# Patient Record
Sex: Female | Born: 1970
Health system: Southern US, Community
[De-identification: ages and names within clinical notes are randomized; demographics above are authoritative.]

## PROBLEM LIST (undated history)

## (undated) DIAGNOSIS — I10 Essential (primary) hypertension: Secondary | ICD-10-CM

## (undated) DIAGNOSIS — T7840XA Allergy, unspecified, initial encounter: Secondary | ICD-10-CM

## (undated) DIAGNOSIS — E079 Disorder of thyroid, unspecified: Secondary | ICD-10-CM

## (undated) DIAGNOSIS — I491 Atrial premature depolarization: Secondary | ICD-10-CM

## (undated) DIAGNOSIS — G9081 Serotonin syndrome: Secondary | ICD-10-CM

## (undated) DIAGNOSIS — M4722 Other spondylosis with radiculopathy, cervical region: Secondary | ICD-10-CM

## (undated) DIAGNOSIS — E785 Hyperlipidemia, unspecified: Secondary | ICD-10-CM

## (undated) DIAGNOSIS — S060X9A Concussion with loss of consciousness of unspecified duration, initial encounter: Secondary | ICD-10-CM

## (undated) DIAGNOSIS — M4712 Other spondylosis with myelopathy, cervical region: Secondary | ICD-10-CM

## (undated) DIAGNOSIS — G2579 Other drug induced movement disorders: Secondary | ICD-10-CM

## (undated) HISTORY — PX: JOINT REPLACEMENT: SHX530

## (undated) HISTORY — DX: Other spondylosis with myelopathy, cervical region: M47.12

## (undated) HISTORY — DX: Hyperlipidemia, unspecified: E78.5

## (undated) HISTORY — PX: SPINE SURGERY: SHX786

## (undated) HISTORY — PX: BREAST SURGERY: SHX581

## (undated) HISTORY — DX: Disorder of thyroid, unspecified: E07.9

## (undated) HISTORY — PX: BREAST LUMPECTOMY: SHX2

## (undated) HISTORY — DX: Allergy, unspecified, initial encounter: T78.40XA

## (undated) HISTORY — DX: Atrial premature depolarization: I49.1

## (undated) HISTORY — DX: Serotonin syndrome: G90.81

## (undated) HISTORY — DX: Other spondylosis with radiculopathy, cervical region: M47.22

## (undated) HISTORY — DX: Concussion with loss of consciousness of unspecified duration, initial encounter: S06.0X9A

## (undated) HISTORY — DX: Other drug induced movement disorders: G25.79

---
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
## 2004-11-18 ENCOUNTER — Ambulatory Visit: Payer: Self-pay | Admitting: Internal Medicine

## 2007-09-28 ENCOUNTER — Ambulatory Visit: Payer: Self-pay

## 2007-10-09 ENCOUNTER — Ambulatory Visit: Payer: Self-pay

## 2007-10-09 ENCOUNTER — Ambulatory Visit: Payer: Self-pay | Admitting: Otolaryngology

## 2008-02-05 ENCOUNTER — Ambulatory Visit: Payer: Self-pay | Admitting: Otolaryngology

## 2008-03-19 ENCOUNTER — Ambulatory Visit: Payer: Self-pay | Admitting: Internal Medicine

## 2008-11-05 ENCOUNTER — Ambulatory Visit: Payer: Self-pay

## 2009-09-14 LAB — HM PAP SMEAR: HM Pap smear: NEGATIVE

## 2010-01-15 ENCOUNTER — Ambulatory Visit: Payer: Self-pay | Admitting: Internal Medicine

## 2010-05-11 HISTORY — PX: TONSILLECTOMY: SUR1361

## 2010-06-03 ENCOUNTER — Ambulatory Visit: Payer: Self-pay | Admitting: Otolaryngology

## 2010-06-04 LAB — PATHOLOGY REPORT

## 2010-10-26 ENCOUNTER — Telehealth: Payer: Self-pay | Admitting: *Deleted

## 2010-10-26 DIAGNOSIS — G4700 Insomnia, unspecified: Secondary | ICD-10-CM

## 2010-10-26 DIAGNOSIS — T753XXA Motion sickness, initial encounter: Secondary | ICD-10-CM

## 2010-10-26 MED ORDER — ZOLPIDEM TARTRATE 5 MG PO TABS: 5.0000 mg | ORAL_TABLET | Freq: Every evening | ORAL | Status: DC | PRN

## 2010-10-26 MED ORDER — RIFAXIMIN 200 MG PO TABS: 200.0000 mg | ORAL_TABLET | Freq: Three times a day (TID) | ORAL | Status: AC

## 2010-10-26 NOTE — Telephone Encounter (Signed)
Patient requesting RX's  1. RF ambien 5 mg 1 hs 2. RF Xifaxan 200 mg 1 qd - She will be leaving to go on mission Oct 30th for 12 days, says she starts this med 2 days prior.

## 2010-10-26 NOTE — Telephone Encounter (Signed)
rxs given,  You didn' t specify how many days of the rifamixin so I gave her #10 days  #30

## 2010-10-26 NOTE — Telephone Encounter (Signed)
Left pt VM to check with her pharm, faxed in RX.

## 2010-11-25 ENCOUNTER — Other Ambulatory Visit: Payer: Self-pay | Admitting: Internal Medicine

## 2010-11-25 DIAGNOSIS — G4700 Insomnia, unspecified: Secondary | ICD-10-CM

## 2010-11-25 MED ORDER — ZOLPIDEM TARTRATE 5 MG PO TABS: 5.0000 mg | ORAL_TABLET | Freq: Every evening | ORAL | Status: DC | PRN

## 2010-12-11 HISTORY — PX: ABDOMINAL HYSTERECTOMY: SHX81

## 2010-12-29 ENCOUNTER — Telehealth: Payer: Self-pay | Admitting: *Deleted

## 2010-12-29 DIAGNOSIS — Z1239 Encounter for other screening for malignant neoplasm of breast: Secondary | ICD-10-CM

## 2010-12-29 NOTE — Telephone Encounter (Signed)
Patient called to request an order for a screening mammogram.  She would like to have this done at [facility].  Please advise.

## 2010-12-29 NOTE — Telephone Encounter (Signed)
Done in EPIC 

## 2010-12-30 NOTE — Telephone Encounter (Signed)
Left message notifying patient that order has been sent to [facility].

## 2011-02-15 LAB — HM MAMMOGRAPHY: HM Mammogram: NORMAL

## 2011-02-18 ENCOUNTER — Ambulatory Visit: Payer: Self-pay | Admitting: Internal Medicine

## 2011-03-14 ENCOUNTER — Telehealth: Payer: Self-pay | Admitting: Internal Medicine

## 2011-03-14 MED ORDER — HYDROCODONE-ACETAMINOPHEN 5-500 MG PO TABS: 1.0000 | ORAL_TABLET | Freq: Four times a day (QID) | ORAL | Status: AC | PRN

## 2011-03-14 NOTE — Telephone Encounter (Signed)
i am happy to see Patricia [name] before her physical for new onset hypertension. The ibuprofen may be causing the elevated readings if she has been taking it daily.  Ask her to stop the ibuprofen and try tramadol 50 mg every 8 hrs prn pain  #60  and see if the bp comes down in a few days.  If not,  Call back and we wills tart an mild antihypertensive until i can see her.

## 2011-03-14 NOTE — Telephone Encounter (Signed)
Office Message [address] [place], [place] [address] p. [phone] f. [phone] To: [facility] (Daytime Triage) Fax: [phone] From: [facility] Date/ Time: 03/14/2011 9:33 AM Taken By: Sundra Aland, RN Caller: Alexiya Facility: Not Collected Patient: Patricia [name], Patricia [name] DOB: [birth year] Phone: [phone] Reason for Call: Caller was unable to be reached on callback - Left Message Regarding Appointment: No Appt Date: Appt Time: Unknown Provider: Reason: Details: Outcome:

## 2011-03-14 NOTE — Telephone Encounter (Signed)
Office Message [address] [place], [place] [address] p. [phone] f. [phone] To: [facility] (Daytime Triage) Fax: [phone] From: [facility] Date/ Time: 03/14/2011 12:41 PM Taken By: Lesli Albee, RN Caller: Riyanshi Facility: not collected Patient: Patricia, [name] DOB: 1970/08/05 Phone: [phone] Reason for Call: Pt insistent that MD be asked if she needs an appt before her appt April 5/13. Pt has been having elevated BP readings of 104-161/88-106 with headaches lasting most of the day. Triage Rn advised appt but pt states sometimes MD will have her fax things over for her to review. Is asking if she can fax BP readings and MD could advise from there. Regarding Appointment: Appt Date: Appt Time: Unknown Provider: Reason: Details: Outcome: confidential

## 2011-03-14 NOTE — Telephone Encounter (Signed)
Patient notified, while talking to her about the tramadol she says that the tramadol has made her sick in the past. I spoke with Dr. Darrick Huntsman and she said to call her in vicodin 5-500 every 6 hr prn. Patient aware that rx is being called in and will call back in a few days if BP has not started to come down. Rx has been called in.

## 2011-03-14 NOTE — Telephone Encounter (Signed)
Tried calling patient, left message asking patient to call back.

## 2011-03-14 NOTE — Telephone Encounter (Signed)
[facility] Triage Call Report Triage Record Num: [id number] Operator: Lesli Albee Patient Name: Patricia [name] Call Date & Time: 03/14/2011 12:26:23PM Patient Phone: [phone] PCP: Patient Gender: Female PCP Fax : Patient DOB: Mar 08, 1970 Practice Name: [facility] Reason for Call: Caller: Earnie/Patient; PCP: Duncan Dull; CB#: [phone]; ; ; Call regarding Blood Pressure Is Up, With Headache 151/98 At 7:30am; Pt has had elevated blood pressure reading x 3 months. This am the h/a was 151/98 - she has not rechecked it. Her next physical s 04/15/2011. She wants to know if MD needs to see her before that appt. Pt has reading of 140-161/ 88-106. Pt states she has "kept " a h/a x the past 3 months. she has them for several hours in the am and then it turns into a nagging h/a - it gets more intense into the evening. Pt has been taking Motrin. Her normal BP is 120/70. Pt has been under alot of stress over the past 6 months. Pt insistent that a request be sent to MD to see if she can send her her BP readings from the clinic . RN sent the message. Protocol(s) Used: Hypertension, Diagnosed or Suspected Recommended Outcome per Protocol: See Provider within 72 Hours Reason for Outcome: Multiple elevated blood pressure readings without other symptoms AND no previous work-up OR readings exceed expected range defined by treatment plan Care Advice: ~ 03/14/2011 1:45:42PM Page 1 of 1 [email or web address] [facility] Triage Call Report Triage Record Num: [id number] Operator: Lesli Albee Patient Name: Patricia [name] Call Date & Time: 03/14/2011 12:26:23PM Patient Phone: [phone] PCP: Patient Gender: Female PCP Fax : Patient DOB: 1970/10/14 Practice Name: [facility] Reason for Call: Caller: Eileene/Patient; PCP: Duncan Dull; CB#: [phone]; ; ; Call regarding Blood Pressure Is Up, With Headache 151/98 At 7:30am; Pt has had elevated blood pressure  reading x 3 months. This am the h/a was 151/98 - she has not rechecked it. Her next physical s 04/15/2011. She wants to know if MD needs to see her before that appt. Pt has reading of 140-161/ 88-106. Pt states she has "kept " a h/a x the past 3 months. she has them for several hours in the am and then it turns into a nagging h/a - it gets more intense into the evening. Pt has been taking Motrin. Her normal BP is 120/70. Pt has been under alot of stress over the past 6 months. Pt insistent that a request be sent to MD to see if she can send her her BP readings from the clinic . RN sent the message. Protocol(s) Used: Headache Recommended Outcome per Protocol: See Provider within 24 hours Reason for Outcome: Constant headache AND unrelieved with nonprescription medications Care Advice: ~ Do not drink alcoholic beverages or use tobacco products. ~ SYMPTOM / CONDITION MANAGEMENT Most adults need to drink 6-10 eight-ounce glasses (1.2-2.0 liters) of fluids per day unless previously told to limit fluid intake for other medical reasons. Limit fluids that contain caffeine, sugar or alcohol. Urine will be a very light yellow color when you drink enough fluids. ~ Analgesic/Antipyretic Advice - Acetaminophen: Consider acetaminophen as directed on label or by pharmacist/provider for pain or fever PRECAUTIONS: - Use if there is no history of liver disease, alcoholism, or intake of three or more alcohol drinks per day - Only if approved by provider during pregnancy or when breastfeeding - During pregnancy, acetaminophen should not be taken more than 3 consecutive days without telling provider - Do not exceed  recommended dose or frequency ~ 03/14/2011 1:45:44PM Page 1 of 1 [email or web address]_[email or web address]_[email or web address]

## 2011-03-15 ENCOUNTER — Encounter: Payer: Self-pay | Admitting: Internal Medicine

## 2011-03-17 ENCOUNTER — Telehealth: Payer: Self-pay | Admitting: Internal Medicine

## 2011-03-17 MED ORDER — AMLODIPINE BESYLATE 5 MG PO TABS: 5.0000 mg | ORAL_TABLET | Freq: Every day | ORAL | Status: DC

## 2011-03-17 NOTE — Telephone Encounter (Signed)
Patient called and wanted to let you know she is still having elevated BPs, but no more headaches.  She stated this morning when she woke up it was 149/104.  She has stopped taking ibuprofen as you asked.  She wanted to know if you wanted to call her something in or if she needs to make an earlier appt.  Her next appt is April 5th.  Please advise.

## 2011-03-17 NOTE — Telephone Encounter (Signed)
Patient notified of Rx.  She will call back if BP is still elevated.

## 2011-03-17 NOTE — Telephone Encounter (Signed)
rx for amlodipine 5 mg daily sent to pharmacy.  It still may be the ibuprofen  Doing it but time will tell

## 2011-04-05 ENCOUNTER — Other Ambulatory Visit: Payer: Self-pay | Admitting: Internal Medicine

## 2011-04-05 MED ORDER — MONTELUKAST SODIUM 10 MG PO TABS: 10.0000 mg | ORAL_TABLET | Freq: Every day | ORAL | Status: DC

## 2011-04-15 ENCOUNTER — Ambulatory Visit (INDEPENDENT_AMBULATORY_CARE_PROVIDER_SITE_OTHER): Payer: Self-pay | Admitting: Internal Medicine

## 2011-04-15 ENCOUNTER — Encounter: Payer: Self-pay | Admitting: Internal Medicine

## 2011-04-15 VITALS — BP 146/72 | HR 100 | Temp 98.3°F | Resp 16 | Ht 68.0 in | Wt 201.2 lb

## 2011-04-15 DIAGNOSIS — Z0000 Encounter for general adult medical examination without abnormal findings: Secondary | ICD-10-CM

## 2011-04-15 DIAGNOSIS — I10 Essential (primary) hypertension: Secondary | ICD-10-CM

## 2011-04-15 DIAGNOSIS — G4700 Insomnia, unspecified: Secondary | ICD-10-CM

## 2011-04-15 DIAGNOSIS — E079 Disorder of thyroid, unspecified: Secondary | ICD-10-CM

## 2011-04-15 MED ORDER — LOSARTAN POTASSIUM-HCTZ 50-12.5 MG PO TABS: 1.0000 | ORAL_TABLET | Freq: Every day | ORAL | Status: DC

## 2011-04-15 MED ORDER — ZOLPIDEM TARTRATE 5 MG PO TABS: 5.0000 mg | ORAL_TABLET | Freq: Every evening | ORAL | Status: DC | PRN

## 2011-04-15 MED ORDER — PRAMIPEXOLE DIHYDROCHLORIDE 0.25 MG PO TABS: 0.2500 mg | ORAL_TABLET | Freq: Every day | ORAL | Status: DC

## 2011-04-15 MED ORDER — LOSARTAN POTASSIUM 50 MG PO TABS: 50.0000 mg | ORAL_TABLET | Freq: Every day | ORAL | Status: DC

## 2011-04-15 NOTE — Patient Instructions (Signed)
Consider the Low Glycemic Index Diet and 6 smaller meals daily  To manage blood sugars and weight:   7 AM Low carbohydrate Protein  Shakes (EAS Carb Control  Or Atkins ,  Available everywhere,   In  cases at [employer]Js )  2.5 carbs  (Add or substitute a toasted sandwhich thin w/ peanut butter)  10 AM: Protein bar by Atkins (snack size,  Chocolate lover's variety at  [employer])    Lunch: sandwich on pita bread or flatbread (Joseph's makes a pita bread and a flat bread , available at [employer] and [employer]; Toufayah makes a low carb flatbread available at [employer] and [employer]) Bueno vida and Mission make low carb whole wheat tortillas available at most grocer stores  3 PM:  Mid day :  Another protein bar,  Or a  cheese stick, 1/4 cup of almonds, walnuts, pistachios, pecans, peanuts,  Macadamia nuts  6 PM  Dinner:  "mean and green:"  Meat/chicken/fish, salad, and green veggie : use ranch, vinagrette,  Blue cheese, etc  9 PM snack : Breyer's low carb fudgiscle or  ice cream bar (Carb Smart) Weight Watcher's ice cream bar ,Skinny Cow or another protein shake ________________________________________________________________________________________________________   We are changing your amlodipine to losartan 50 mg daily.

## 2011-04-15 NOTE — Progress Notes (Signed)
Patient ID: Patricia [name], female   DOB: 1970/04/24, 41 y.o.   MRN: [id number]   Patient Active Problem List  Diagnoses  . Thyroid disease  . Hypertension  . Annual physical exam    Subjective:  CC:   Chief Complaint  Patient presents with  . Gynecologic Exam    HPI:   Patricia [name] a 40 y.o. female who presents  Past Medical History  Diagnosis Date  . Thyroid disease     thyroid nodules    Past Surgical History  Procedure Date  . Abdominal hysterectomy Dec 2012    secondary to fibroid, heavy bleeding   . Tonsillectomy May 2012    [facility]         The following portions of the patient's history were reviewed and updated as appropriate: Allergies, current medications, and problem list.    Review of Systems:   12 Pt  review of systems was negative except those addressed in the HPI,     History   Social History  . Marital Status: Married    Spouse Name: N/A    Number of Children: N/A  . Years of Education: N/A   Occupational History  . Not on file.   Social History Main Topics  . Smoking status: Never Smoker   . Smokeless tobacco: Never Used  . Alcohol Use: Yes  . Drug Use: No  . Sexually Active: Not on file   Other Topics Concern  . Not on file   Social History Narrative  . No narrative on file    Objective:  BP 146/72  Pulse 100  Temp(Src) 98.3 F (36.8 C) (Oral)  Resp 16  Ht 5\' 8"  (1.727 m)  Wt 201 lb 4 oz (91.286 kg)  BMI 30.60 kg/m2  SpO2 100%  General appearance: alert, cooperative and appears stated age Head: Normocephalic, without obvious abnormality, atraumatic Eyes: conjunctivae/corneas clear. PERRL, EOM's intact. Fundi benign. Ears: normal TM's and external ear canals both ears Nose: Nares normal. Septum midline. Mucosa normal. No drainage or sinus tenderness. Throat: lips, mucosa, and tongue normal; teeth and gums normal Neck: no adenopathy, no carotid bruit, no JVD, supple, symmetrical, trachea midline and  thyroid not enlarged, symmetric, no tenderness/mass/nodules Lungs: clear to auscultation bilaterally Breasts: normal appearance, no masses or tenderness Heart: regular rate and rhythm, S1, S2 normal, no murmur, click, rub or gallop Abdomen: soft, non-tender; bowel sounds normal; no masses,  no organomegaly Gyn: normal external genitalia, normal vaginal vault, cervix, ovaries nonpalpable Extremities: extremities normal, atraumatic, no cyanosis or edema Pulses: 2+ and symmetric Skin: Skin color, texture, turgor normal. No rashes or lesions Neurologic: Alert and oriented X 3, normal strength and tone. Normal symmetric reflexes. Normal coordination and gait.  Assessment and Plan:  Hypertension New onset, with recent initiation of therapy with amlodipine, which was been causing fluid retention.  Trial od losartan,,  Needs repeat BMET in one week..   Thyroid disease With recent negative biopsy by endocrinologist.   Annual physical exam Breast and pelvic were done.     Updated Medication List Outpatient Encounter Prescriptions as of 04/15/2011  Medication Sig Dispense Refill  . loratadine (CLARITIN) 10 MG tablet Take 10 mg by mouth daily.      . montelukast (SINGULAIR) 10 MG tablet Take 1 tablet (10 mg total) by mouth at bedtime.  30 tablet  3  . pramipexole (MIRAPEX) 0.25 MG tablet Take 1 tablet (0.25 mg total) by mouth daily.  90 tablet  3  . zolpidem (  AMBIEN) 5 MG tablet Take 1 tablet (5 mg total) by mouth at bedtime as needed for sleep.  30 tablet  5  . DISCONTD: amLODipine (NORVASC) 5 MG tablet Take 1 tablet (5 mg total) by mouth daily.  30 tablet  3  . DISCONTD: pramipexole (MIRAPEX) 0.25 MG tablet Take 0.25 mg by mouth daily.      Marland Kitchen DISCONTD: zolpidem (AMBIEN) 5 MG tablet Take 1 tablet (5 mg total) by mouth at bedtime as needed for sleep.  30 tablet  5  . losartan (COZAAR) 50 MG tablet Take 1 tablet (50 mg total) by mouth daily.  90 tablet  3  . DISCONTD: losartan-hydrochlorothiazide  (HYZAAR) 50-12.5 MG per tablet Take 1 tablet by mouth daily.  30 tablet  3     Orders Placed This Encounter  Procedures  . [facility] MAMMOGRAPHY    No Follow-up on file.

## 2011-04-17 ENCOUNTER — Encounter: Payer: Self-pay | Admitting: Internal Medicine

## 2011-04-17 DIAGNOSIS — I10 Essential (primary) hypertension: Secondary | ICD-10-CM | POA: Insufficient documentation

## 2011-04-17 DIAGNOSIS — Z0000 Encounter for general adult medical examination without abnormal findings: Secondary | ICD-10-CM | POA: Insufficient documentation

## 2011-04-17 DIAGNOSIS — E079 Disorder of thyroid, unspecified: Secondary | ICD-10-CM | POA: Insufficient documentation

## 2011-04-17 NOTE — Assessment & Plan Note (Signed)
New onset, with recent initiation of therapy with amlodipine, which was been causing fluid retention.  Trial od losartan,,  Needs repeat BMET in one week.Marland Kitchen

## 2011-04-17 NOTE — Assessment & Plan Note (Signed)
Breast and pelvic were done.

## 2011-04-17 NOTE — Assessment & Plan Note (Signed)
With recent negative biopsy by endocrinologist.

## 2011-04-26 ENCOUNTER — Encounter: Payer: Self-pay | Admitting: Internal Medicine

## 2011-04-28 ENCOUNTER — Other Ambulatory Visit (INDEPENDENT_AMBULATORY_CARE_PROVIDER_SITE_OTHER): Payer: Self-pay | Admitting: *Deleted

## 2011-04-28 DIAGNOSIS — Z79899 Other long term (current) drug therapy: Secondary | ICD-10-CM

## 2011-04-28 LAB — BASIC METABOLIC PANEL
BUN: 9 mg/dL (ref 6–23)
CO2: 23 mEq/L (ref 19–32)
Calcium: 8.9 mg/dL (ref 8.4–10.5)
Chloride: 103 mEq/L (ref 96–112)
Creatinine, Ser: 0.8 mg/dL (ref 0.4–1.2)
GFR: 79.22 mL/min (ref >60.00)
Glucose, Bld: 98 mg/dL (ref 70–99)
Potassium: 4 mEq/L (ref 3.5–5.1)
Sodium: 136 mEq/L (ref 135–145)

## 2011-06-15 ENCOUNTER — Telehealth: Payer: Self-pay | Admitting: Internal Medicine

## 2011-06-15 DIAGNOSIS — G4700 Insomnia, unspecified: Secondary | ICD-10-CM

## 2011-06-15 MED ORDER — MONTELUKAST SODIUM 10 MG PO TABS: 10.0000 mg | ORAL_TABLET | Freq: Every day | ORAL | Status: DC

## 2011-06-15 MED ORDER — ZOLPIDEM TARTRATE 10 MG PO TABS: 10.0000 mg | ORAL_TABLET | Freq: Every evening | ORAL | Status: DC | PRN

## 2011-06-15 NOTE — Telephone Encounter (Signed)
Patient called and stated she needs a refill on her zolpidem but she said the 5 mg tablet is not working for her.  She stated she wakes up 2 hours after falling asleep and then can't go back to sleep.  She wanted to know if she could go back on the 10 mg tablet.  Please advise.

## 2011-06-15 NOTE — Telephone Encounter (Signed)
She can either have the 10 mg or the Ambien Ct.  Since the 10 mg is generic we can try that first.  10 mg rx approved in EPIC, can you call to [facility]?

## 2011-06-15 NOTE — Telephone Encounter (Signed)
Patient notified, Rx called in. 

## 2011-08-09 ENCOUNTER — Encounter: Payer: Self-pay | Admitting: Internal Medicine

## 2011-10-12 ENCOUNTER — Telehealth: Payer: Self-pay | Admitting: Internal Medicine

## 2011-10-12 MED ORDER — MONTELUKAST SODIUM 10 MG PO TABS: 10.0000 mg | ORAL_TABLET | Freq: Every day | ORAL | Status: DC

## 2011-10-12 MED ORDER — PRAMIPEXOLE DIHYDROCHLORIDE 0.25 MG PO TABS: 0.2500 mg | ORAL_TABLET | Freq: Every day | ORAL | Status: DC

## 2011-10-12 NOTE — Telephone Encounter (Signed)
Pt is needing her rx switched over to [facility] and she is needing refills on Singular and Mirapex.

## 2011-10-12 NOTE — Telephone Encounter (Signed)
rx sent to pharmacy by e-script, [facility] pharmacy

## 2012-01-23 ENCOUNTER — Telehealth: Payer: Self-pay | Admitting: Internal Medicine

## 2012-01-23 DIAGNOSIS — Z1239 Encounter for other screening for malignant neoplasm of breast: Secondary | ICD-10-CM

## 2012-01-23 NOTE — Telephone Encounter (Signed)
On printer

## 2012-01-23 NOTE — Telephone Encounter (Signed)
Med called in

## 2012-01-23 NOTE — Telephone Encounter (Signed)
Pt is needing a referral for her Mammo. At [facility]

## 2012-02-21 ENCOUNTER — Ambulatory Visit: Payer: Self-pay | Admitting: Internal Medicine

## 2012-02-27 ENCOUNTER — Ambulatory Visit: Payer: Self-pay | Admitting: Internal Medicine

## 2012-02-27 ENCOUNTER — Telehealth: Payer: Self-pay | Admitting: Internal Medicine

## 2012-02-27 ENCOUNTER — Other Ambulatory Visit: Payer: Self-pay | Admitting: Internal Medicine

## 2012-02-27 DIAGNOSIS — R928 Other abnormal and inconclusive findings on diagnostic imaging of breast: Secondary | ICD-10-CM

## 2012-02-27 NOTE — Telephone Encounter (Signed)
Patricia [name] needs additional images of left breast.  Orders printed. Please notify patient

## 2012-02-27 NOTE — Telephone Encounter (Signed)
Pt notified and order given to [facility].

## 2012-03-02 ENCOUNTER — Telehealth: Payer: Self-pay | Admitting: General Practice

## 2012-03-02 NOTE — Telephone Encounter (Signed)
Repeat films of Left Breast are normal.

## 2012-03-02 NOTE — Telephone Encounter (Signed)
LMOVM for pt to return call 

## 2012-03-05 ENCOUNTER — Telehealth: Payer: Self-pay | Admitting: Internal Medicine

## 2012-03-05 NOTE — Telephone Encounter (Signed)
RN attempted to call patient, left a voice mail.  ° °

## 2012-03-06 NOTE — Telephone Encounter (Signed)
Pt.notified

## 2012-03-13 ENCOUNTER — Encounter: Payer: Self-pay | Admitting: Internal Medicine

## 2012-03-14 ENCOUNTER — Encounter: Payer: Self-pay | Admitting: Internal Medicine

## 2012-03-29 ENCOUNTER — Telehealth: Payer: Self-pay | Admitting: Internal Medicine

## 2012-03-29 NOTE — Telephone Encounter (Signed)
Appt sched for 4/29 for CPE.  Pt asking to have lab slip faxed to her so that she can get the labs at her facility as usual - Fax [phone] [facility] Ines Bloomer.  Appt was r/s from 4/29 at 10:30 a.m. To 4:pm due to pt needing that date, later time due to her schedule w/ patients.

## 2012-03-30 NOTE — Telephone Encounter (Signed)
Letter on printer. See patients message

## 2012-03-30 NOTE — Telephone Encounter (Signed)
Order faxed to pt.

## 2012-05-03 ENCOUNTER — Other Ambulatory Visit: Payer: Self-pay | Admitting: Internal Medicine

## 2012-05-03 ENCOUNTER — Telehealth: Payer: Self-pay | Admitting: Internal Medicine

## 2012-05-03 LAB — CBC WITH DIFFERENTIAL/PLATELET
Basophil #: 0.1 10*3/uL (ref 0.0–0.1)
Basophil %: 0.7 %
Eosinophil #: 0.3 10*3/uL (ref 0.0–0.7)
Eosinophil %: 3.2 %
HCT: 41.1 % (ref 35.0–47.0)
HGB: 13.7 g/dL (ref 12.0–16.0)
Lymphocyte #: 2.7 10*3/uL (ref 1.0–3.6)
Lymphocyte %: 31.7 %
MCH: 28.6 pg (ref 26.0–34.0)
MCHC: 33.4 g/dL (ref 32.0–36.0)
MCV: 86 fL (ref 80–100)
Monocyte #: 0.6 x10 3/mm (ref 0.2–0.9)
Monocyte %: 7.4 %
Neutrophil #: 4.9 10*3/uL (ref 1.4–6.5)
Neutrophil %: 57 %
Platelet: 247 10*3/uL (ref 150–440)
RBC: 4.81 10*6/uL (ref 3.80–5.20)
RDW: 13.7 % (ref 11.5–14.5)
WBC: 8.5 10*3/uL (ref 3.6–11.0)

## 2012-05-03 LAB — COMPREHENSIVE METABOLIC PANEL
Albumin: 3.3 g/dL — ABNORMAL LOW (ref 3.4–5.0)
Alkaline Phosphatase: 63 U/L (ref 50–136)
Anion Gap: 5 — ABNORMAL LOW (ref 7–16)
BUN: 15 mg/dL (ref 7–18)
Bilirubin,Total: 0.7 mg/dL (ref 0.2–1.0)
Calcium, Total: 8.2 mg/dL — ABNORMAL LOW (ref 8.5–10.1)
Chloride: 104 mmol/L (ref 98–107)
Co2: 26 mmol/L (ref 21–32)
Creatinine: 0.88 mg/dL (ref 0.60–1.30)
Glucose: 108 mg/dL — ABNORMAL HIGH (ref 65–99)
Osmolality: 271 (ref 275–301)
Potassium: 4.1 mmol/L (ref 3.5–5.1)
SGOT(AST): 17 U/L (ref 15–37)
SGPT (ALT): 23 U/L (ref 12–78)
Sodium: 135 mmol/L — ABNORMAL LOW (ref 136–145)
Total Protein: 7.3 g/dL (ref 6.4–8.2)

## 2012-05-03 LAB — LIPID PANEL
Cholesterol: 206 mg/dL — ABNORMAL HIGH (ref 0–200)
HDL Cholesterol: 45 mg/dL (ref 40–60)
Ldl Cholesterol, Calc: 134 mg/dL — ABNORMAL HIGH (ref 0–100)
Triglycerides: 135 mg/dL (ref 0–200)
VLDL Cholesterol, Calc: 27 mg/dL (ref 5–40)

## 2012-05-03 LAB — TSH: Thyroid Stimulating Horm: 2.27 u[IU]/mL

## 2012-05-03 NOTE — Telephone Encounter (Signed)
Labs in Dr. Melina Schools folder

## 2012-05-03 NOTE — Telephone Encounter (Signed)
[facility] labs in box °

## 2012-05-04 ENCOUNTER — Telehealth: Payer: Self-pay | Admitting: Internal Medicine

## 2012-05-04 NOTE — Telephone Encounter (Signed)
Received in folder

## 2012-05-04 NOTE — Telephone Encounter (Signed)
[facility] labs in box °

## 2012-05-04 NOTE — Telephone Encounter (Signed)
Her thyroid function was normal at 2.274 her TSH, but her complete metabolic panel suggested mild dehydration and her protein was down , , albumin was 3.3  Her cholesterol was normal. LDL is 134 HDL 45 triglycerides 135 total 206

## 2012-05-04 NOTE — Telephone Encounter (Signed)
Left message for patient to call office home voicemail mobile full.

## 2012-05-07 NOTE — Telephone Encounter (Signed)
Detailed message left on mobile per DPR.

## 2012-05-08 ENCOUNTER — Ambulatory Visit (INDEPENDENT_AMBULATORY_CARE_PROVIDER_SITE_OTHER): Payer: 59 | Admitting: Internal Medicine

## 2012-05-08 ENCOUNTER — Encounter: Payer: PRIVATE HEALTH INSURANCE | Admitting: Internal Medicine

## 2012-05-08 ENCOUNTER — Encounter: Payer: Self-pay | Admitting: Internal Medicine

## 2012-05-08 VITALS — BP 128/89 | HR 72 | Temp 98.4°F | Resp 16 | Ht 68.0 in | Wt 198.0 lb

## 2012-05-08 DIAGNOSIS — I10 Essential (primary) hypertension: Secondary | ICD-10-CM

## 2012-05-08 DIAGNOSIS — E079 Disorder of thyroid, unspecified: Secondary | ICD-10-CM

## 2012-05-08 DIAGNOSIS — Z0000 Encounter for general adult medical examination without abnormal findings: Secondary | ICD-10-CM

## 2012-05-08 MED ORDER — BIOTIN 1000 MCG PO TABS: 1000.0000 ug | ORAL_TABLET | Freq: Three times a day (TID) | ORAL | Status: DC

## 2012-05-08 MED ORDER — PRAMIPEXOLE DIHYDROCHLORIDE 0.25 MG PO TABS: 0.2500 mg | ORAL_TABLET | Freq: Every day | ORAL | Status: DC

## 2012-05-08 MED ORDER — MONTELUKAST SODIUM 10 MG PO TABS: 10.0000 mg | ORAL_TABLET | Freq: Every day | ORAL | Status: DC

## 2012-05-08 MED ORDER — LORATADINE 10 MG PO TABS: 10.0000 mg | ORAL_TABLET | Freq: Every day | ORAL | Status: DC

## 2012-05-08 NOTE — Assessment & Plan Note (Signed)
She has normal thyroid function and 2 prior biopsies that were normal.

## 2012-05-08 NOTE — Assessment & Plan Note (Signed)
Breast and pelvic done,  Patient is s/p total hysterectomy.

## 2012-05-08 NOTE — Assessment & Plan Note (Addendum)
Home bps have been < 120/80.  Checking urine for microscopic protein. Will add ACE Inhibitor if positive.

## 2012-05-08 NOTE — Progress Notes (Signed)
Patient ID: Patricia [name], female   DOB: 16-Jun-1970, 42 y.o.   MRN: [id number]  Subjective:     Patricia [name] is a 42 y.o. female and is here for a comprehensive physical exam. The patient reports a very emotionally stressful year .  History   Social History  . Marital Status: Married    Spouse Name: N/A    Number of Children: N/A  . Years of Education: N/A   Occupational History  . Not on file.   Social History Main Topics  . Smoking status: Never Smoker   . Smokeless tobacco: Never Used  . Alcohol Use: Yes  . Drug Use: No  . Sexually Active: Not on file   Other Topics Concern  . Not on file   Social History Narrative  . No narrative on file   Health Maintenance  Topic Date Due  . Tetanus/tdap  10/30/1988  . Pap Smear  03/26/2012  . Influenza Vaccine  09/10/2012    The following portions of the patient's history were reviewed and updated as appropriate: allergies, current medications, past family history, past medical history, past social history, past surgical history and problem list.  Review of Systems A comprehensive review of systems was negative.   Objective:   BP 128/89  Pulse 72  Temp(Src) 98.4 F (36.9 C) (Oral)  Resp 16  Ht 5\' 8"  (1.727 m)  Wt 198 lb (89.812 kg)  BMI 30.11 kg/m2  SpO2 98%  General Appearance:    Alert, cooperative, no distress, appears stated age  Head:    Normocephalic, without obvious abnormality, atraumatic  Eyes:    PERRL, conjunctiva/corneas clear, EOM's intact, fundi    benign, both eyes  Ears:    Normal TM's and external ear canals, both ears  Nose:   Nares normal, septum midline, mucosa normal, no drainage    or sinus tenderness  Throat:   Lips, mucosa, and tongue normal; teeth and gums normal  Neck:   Supple, symmetrical, trachea midline, no adenopathy;    thyroid:  no enlargement/tenderness/nodules; no carotid   bruit or JVD  Back:     Symmetric, no curvature, ROM normal, no CVA tenderness  Lungs:     Clear to  auscultation bilaterally, respirations unlabored  Chest Wall:    No tenderness or deformity   Heart:    Regular rate and rhythm, S1 and S2 normal, no murmur, rub   or gallop  Breast Exam:    No tenderness, masses, or nipple abnormality  Abdomen:     Soft, non-tender, bowel sounds active all four quadrants,    no masses, no organomegaly  Genitalia:    Pelvic: cervix surgically absent , external genitalia normal, no adnexal masses or tenderness,  rectovaginal septum normal, uterus absent and vagina normal without discharge  Extremities:   Extremities normal, atraumatic, no cyanosis or edema  Pulses:   2+ and symmetric all extremities  Skin:   Skin color, texture, turgor normal, no rashes or lesions  Lymph nodes:   Cervical, supraclavicular, and axillary nodes normal  Neurologic:   CNII-XII intact, normal strength, sensation and reflexes    throughout    Assessment:   Hypertension Home bps have been < 120/80.  Checking urine for microscopic protein. Will add ACE Inhibitor if positive.   Thyroid disease She has normal thyroid function and 2 prior biopsies that were normal.   Routine general medical examination at a health care facility Breast and pelvic done,  Patient is s/p total hysterectomy.  Updated Medication List Outpatient Encounter Prescriptions as of 05/08/2012  Medication Sig Dispense Refill  . Biotin 1000 MCG tablet Take 1 tablet (1 mg total) by mouth 3 (three) times daily.  90 tablet  3  . loratadine (CLARITIN) 10 MG tablet Take 1 tablet (10 mg total) by mouth daily.  90 tablet  2  . montelukast (SINGULAIR) 10 MG tablet Take 1 tablet (10 mg total) by mouth at bedtime.  90 tablet  3  . Multiple Vitamins-Minerals (MULTIVITAMIN WITH MINERALS) tablet Take 1 tablet by mouth daily.      . pramipexole (MIRAPEX) 0.25 MG tablet Take 1 tablet (0.25 mg total) by mouth daily.  90 tablet  3  . [DISCONTINUED] Biotin 1000 MCG tablet Take 1,000 mcg by mouth 3 (three) times daily.      .  [DISCONTINUED] loratadine (CLARITIN) 10 MG tablet Take 10 mg by mouth daily.      . [DISCONTINUED] montelukast (SINGULAIR) 10 MG tablet Take 1 tablet (10 mg total) by mouth at bedtime.  90 tablet  3  . [DISCONTINUED] pramipexole (MIRAPEX) 0.25 MG tablet Take 1 tablet (0.25 mg total) by mouth daily.  90 tablet  3  . losartan (COZAAR) 50 MG tablet Take 1 tablet (50 mg total) by mouth daily.  90 tablet  3  . zolpidem (AMBIEN) 10 MG tablet Take 1 tablet (10 mg total) by mouth at bedtime as needed for sleep.  30 tablet  1   No facility-administered encounter medications on file as of 05/08/2012.

## 2012-05-09 LAB — MICROALBUMIN / CREATININE URINE RATIO
Creatinine,U: 87.3 mg/dL
Microalb Creat Ratio: 1 mg/g (ref 0.0–30.0)
Microalb, Ur: 0.9 mg/dL (ref 0.0–1.9)

## 2012-05-22 ENCOUNTER — Ambulatory Visit: Payer: Self-pay | Admitting: Obstetrics and Gynecology

## 2012-08-09 ENCOUNTER — Other Ambulatory Visit: Payer: Self-pay | Admitting: Internal Medicine

## 2012-08-09 ENCOUNTER — Telehealth: Payer: Self-pay | Admitting: Internal Medicine

## 2012-08-09 DIAGNOSIS — G4700 Insomnia, unspecified: Secondary | ICD-10-CM

## 2012-08-09 MED ORDER — DIPHENOXYLATE-ATROPINE 2.5-0.025 MG PO TABS: 1.0000 | ORAL_TABLET | Freq: Four times a day (QID) | ORAL | Status: DC | PRN

## 2012-08-09 MED ORDER — ATOVAQUONE-PROGUANIL HCL 250-100 MG PO TABS: 1.0000 | ORAL_TABLET | Freq: Every day | ORAL | Status: DC

## 2012-08-09 MED ORDER — ZOLPIDEM TARTRATE 10 MG PO TABS: 10.0000 mg | ORAL_TABLET | Freq: Every evening | ORAL | Status: DC | PRN

## 2012-08-09 MED ORDER — TYPHOID VACCINE PO CPDR: 1.0000 | DELAYED_RELEASE_CAPSULE | ORAL | Status: DC

## 2012-08-09 NOTE — Telephone Encounter (Signed)
Ok to refill,  Refill sent Patricia [name] needs faxing

## 2012-08-09 NOTE — Telephone Encounter (Signed)
Scripts faxed

## 2012-08-09 NOTE — Telephone Encounter (Signed)
Fixing to leave for a mission trip to [place].  States Dr. Darrick Huntsman told her to just call before she left and Dr. Darrick Huntsman would call in any medications she needs to take with her:   Vivotis 1 pill every other day x 4 doses.   Malarone 1 pill a day for 14 days.   Ambien 10 mg x 14 days.   Lomotil x 7 days for travelers diarrhea.  [facility]

## 2012-08-09 NOTE — Telephone Encounter (Signed)
Vivotif Bran EC capsules and the other one is Malarone 250/100. Sig is as written below.

## 2012-08-09 NOTE — Telephone Encounter (Signed)
No problem but needs doses of the two medications,  One is typhoid and one is malaria prophylaxis,  [facility] pharmacy may be albe to help

## 2012-09-06 ENCOUNTER — Telehealth: Payer: Self-pay | Admitting: *Deleted

## 2012-09-06 NOTE — Telephone Encounter (Signed)
FYI patient has an appointment to discuss depression Issue's FYI 09/14/12

## 2012-09-13 ENCOUNTER — Encounter: Payer: Self-pay | Admitting: *Deleted

## 2012-09-14 ENCOUNTER — Encounter: Payer: Self-pay | Admitting: Internal Medicine

## 2012-09-14 ENCOUNTER — Ambulatory Visit (INDEPENDENT_AMBULATORY_CARE_PROVIDER_SITE_OTHER): Payer: 59 | Admitting: Internal Medicine

## 2012-09-14 VITALS — BP 138/86 | HR 71 | Temp 98.6°F | Resp 14 | Ht 68.0 in | Wt 207.5 lb

## 2012-09-14 DIAGNOSIS — F329 Major depressive disorder, single episode, unspecified: Secondary | ICD-10-CM

## 2012-09-14 DIAGNOSIS — F339 Major depressive disorder, recurrent, unspecified: Secondary | ICD-10-CM | POA: Insufficient documentation

## 2012-09-14 DIAGNOSIS — E669 Obesity, unspecified: Secondary | ICD-10-CM

## 2012-09-14 DIAGNOSIS — G2581 Restless legs syndrome: Secondary | ICD-10-CM | POA: Insufficient documentation

## 2012-09-14 MED ORDER — BUPROPION HCL 75 MG PO TABS: 75.0000 mg | ORAL_TABLET | Freq: Two times a day (BID) | ORAL | Status: DC

## 2012-09-14 MED ORDER — CLONAZEPAM 0.5 MG PO TABS: 0.5000 mg | ORAL_TABLET | Freq: Two times a day (BID) | ORAL | Status: DC | PRN

## 2012-09-14 NOTE — Patient Instructions (Addendum)
Trial of Wellbutrin.  Start with 1 tablet daily  Int he morning,  Add second dose by 2 or 3 PM   klonopin if needed start with 1/2 tablet  Exercise for 15 minutes 3 days per week   This is  One version of a  "Low GI"  Diet:  It will still lower your blood sugars and allow you to lose 4 to 8  lbs  per month if you follow it carefully.  Your goal with exercise is a minimum of 30 minutes of aerobic exercise 5 days per week (Walking does not count once it becomes easy!)    All of the foods can be found at grocery stores and in bulk at [employer].  The Atkins protein bars and shakes are available in more varieties at Target, WalMart and [employer]'s Foods.     7 AM Breakfast:  Choose from the following:  Low carbohydrate Protein  Shakes (I recommend the EAS AdvantEdge "Carb Control" shakes  Or the low carb shakes by Atkins.    2.5 carbs   Arnold's "Sandwhich Thin"toasted  w/ peanut butter (no jelly: about 20 net carbs  "Bagel Thin" with cream cheese and salmon: about 20 carbs   a scrambled egg/bacon/cheese burrito made with Mission's "carb balance" whole wheat tortilla  (about 10 net carbs )   Avoid cereal and bananas, oatmeal and cream of wheat and grits. They are loaded with carbohydrates!   10 AM: high protein snack  Protein bar by Atkins (the snack size, under 200 cal, usually < 6 net carbs).    A stick of cheese:  Around 1 carb,  100 cal     Dannon Light n Fit [place] Yogurt  (80 cal, 8 carbs)  Other so called "protein bars" and Greek yogurts tend to be loaded with carbohydrates.  Remember, in food advertising, the word "energy" is synonymous for " carbohydrate."  Lunch:   A Sandwich using the bread choices listed, Can use any  Eggs,  lunchmeat, grilled meat or canned tuna), avocado, regular mayo/mustard  and cheese.  A Salad using blue cheese, ranch,  Goddess or vinagrette,  No croutons or "confetti" and no "candied nuts" but regular nuts OK.   No pretzels or chips.  Pickles and miniature sweet  peppers are a good low carb alternative that provide a "crunch"  The bread is the only source of carbohydrate in a sandwich and  can be decreased by trying some of these alternatives to traditional loaf bread  Joseph's makes a pita bread and a flat bread that are 50 cal and 4 net carbs available at BJs and WalMart.  This can be toasted to use with hummous as well  Toufayan makes a low carb flatbread that's 100 cal and 9 net carbs available at [employer] and [employer] makes 2 sizes of  Low carb whole wheat tortilla  (The large one is 210 cal and 6 net carbs) Avoid "Low fat dressings, as well as Reyne Dumas and [address] dressings They are loaded with sugar!   3 PM/ Mid day  Snack:  Consider  1 ounce of  almonds, walnuts, pistachios, pecans, peanuts,  Macadamia nuts or a nut medley.  Avoid "granola"; the dried cranberries and raisins are loaded with carbohydrates. Mixed nuts as long as there are no raisins,  cranberries or dried fruit.     6 PM  Dinner:     Meat/fowl/fish with a green salad, and either broccoli, cauliflower, green beans,  spinach, brussel sprouts or  Lima beans. DO NOT BREAD THE PROTEIN!!      There is a low carb pasta by Dreamfield's that is acceptable and tastes great: only 5 digestible carbs/serving.( All grocery stores but BJs carry it )  Try Kai Levins Angelo's chicken piccata or chicken or eggplant parm over low carb pasta.(Lowes and BJs)   Clifton Custard Sanchez's "Carnitas" (pulled pork, no sauce,  0 carbs) or his beef pot roast to make a dinner burrito (at BJ's)  Pesto over low carb pasta (bj's sells a good quality pesto in the center refrigerated section of the deli   Whole wheat pasta is still full of digestible carbs and  Not as low in glycemic index as Dreamfield's.   Brown rice is still rice,  So skip the rice and noodles if you eat [place] or [place] (or at least limit to 1/2 cup)  9 PM snack :   Breyer's "low carb" fudgsicle or  ice cream bar (Carb Smart line), or  Weight  Watcher's ice cream bar , or another "no sugar added" ice cream;  a serving of fresh berries/cherries with whipped cream   Cheese or DANNON'S LlGHT N FIT [place] YOGURT  Avoid bananas, pineapple, grapes  and watermelon on a regular basis because they are high in sugar.  THINK OF THEM AS DESSERT  Remember that snack Substitutions should be less than 10 NET carbs per serving and meals < 20 carbs. Remember to subtract fiber grams to get the "net carbs."

## 2012-09-14 NOTE — Progress Notes (Signed)
Patient ID: Patricia [name], female   DOB: 1970/01/29, 42 y.o.   MRN: [id number]   Patient Active Problem List   Diagnosis Date Noted  . Obesity, unspecified 09/16/2012  . Restless legs 09/14/2012  . Major depressive disorder, single episode 09/14/2012  . Routine general medical examination at a health care facility 05/08/2012  . Hypertension 04/17/2011  . Annual physical exam 04/17/2011  . Thyroid disease     Subjective:  CC:   Chief Complaint  Patient presents with  . Depression    Discuss medication    HPI: .    Patricia [name] a 42 y.o. female who presents  With signs and symptoms of depression complicated by increased anxiety and chronic  insomnia. Patient cites several precipitants and includes Family stressors including marital discord and estrangement of her 50 year old daughter.. the sources of controversy that are causing marital discord are disagreement over matters a finance and parenting.   Her 6 year old daughter was involved in a serious car accident several months ago which involved a pelvic fracture. After recovering,  she moved out of the house and has joined the [employer]. Her husband blames the patient for daughter's estrangement and patient and husband argue continually about her  Impossibly highexpectations of her children  She has been married for 22 years and committed to her husband for 4 years prior to the marriage. Their marriage has been strained since her daughter's ordeal. They are sleeping in separate rooms. When they are due patient's response is to walk out. Patient and husband  want to save the marriage and have been in counselling in the past but not currently.  patient is not in formal  therapy but meets with a  trusted mentor every 2 weeks.  additional issues of controversy include management of finances.  patient states that her husband spends more than he makes. Patient also has a higher income and her husband. They are supporting to other children who live  at home including  11 yr old son who according to patient, hates school.  her third child is a twin of a strange daughter.  [name] is 14  and a sophomore at  [employer].    Patient would like to try SSRI therapy but is very concerned about the weight gain. She is in [place] with her weight for several years. 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 She does have  home exercise equipment but does not use it regularly .  She is not following a regular diet  Past Medical History  Diagnosis Date  . Thyroid disease     thyroid nodules    Past Surgical History  Procedure Laterality Date  . Abdominal hysterectomy  Dec 2012    secondary to fibroid, heavy bleeding   . Tonsillectomy  May 2012    [facility]       The following portions of the patient's history were reviewed and updated as appropriate: Allergies, current medications, and problem list.    Review of Systems:   12 Pt  review of systems was negative except those addressed in the HPI,     History   Social History  . Marital Status: Married    Spouse Name: N/A    Number of Children: N/A  . Years of Education: N/A   Occupational History  . Not on file.   Social History Main Topics  . Smoking status: Never Smoker   . Smokeless tobacco: Never Used  .  Alcohol Use: Yes  . Drug Use: No  . Sexual Activity: Not on file   Other Topics Concern  . Not on file   Social History Narrative  . No narrative on file    Objective:  Filed Vitals:   09/14/12 0833  BP: 138/86  Pulse: 71  Temp: 98.6 F (37 C)  Resp: 14     General appearance: alert, cooperative and appears stated age Ears: normal TM's and external ear canals both ears Throat: lips, mucosa, and tongue normal; teeth and gums normal Neck: no adenopathy, no carotid bruit, supple, symmetrical, trachea midline and thyroid not enlarged, symmetric, no tenderness/mass/nodules Back: symmetric, no curvature. ROM normal. No  CVA tenderness. Lungs: clear to auscultation bilaterally Heart: regular rate and rhythm, S1, S2 normal, no murmur, click, rub or gallop Abdomen: soft, non-tender; bowel sounds normal; no masses,  no organomegaly Pulses: 2+ and symmetric Skin: Skin color, texture, turgor normal. No rashes or lesions Lymph nodes: Cervical, supraclavicular, and axillary nodes normal.  Assessment and Plan:  Major depressive disorder, single episode 40 minutes spent in discussion of symptoms  And available therapies including  the risks and benefits of Wellbutrin versus SSRIs. She understands that if  the Wellbutrin makes her more aggressive, this is a commonly reported side effect. She prefers a trial of Wellbutrin due to the fear of weight gain. I've also given her prescription for Klonopin to use Wellbutrin interferes with her ability to get much rest. Couples and individual counselling recommended.  Obesity, unspecified I have addressed  BMI and recommended a low glycemic index diet utilizing smaller more frequent meals to increase metabolism.  I have also recommended that patient start exercising with a goal of 30 minutes of aerobic exercise a minimum of 5 days per week. Screening for lipid disorders, thyroid and diabetes has been done     Updated Medication List Outpatient Encounter Prescriptions as of 09/14/2012  Medication Sig Dispense Refill  . Biotin 1000 MCG tablet Take 1 tablet (1 mg total) by mouth 3 (three) times daily.  90 tablet  3  . loratadine (CLARITIN) 10 MG tablet Take 1 tablet (10 mg total) by mouth daily.  90 tablet  2  . montelukast (SINGULAIR) 10 MG tablet Take 1 tablet (10 mg total) by mouth at bedtime.  90 tablet  3  . Multiple Vitamins-Minerals (MULTIVITAMIN WITH MINERALS) tablet Take 1 tablet by mouth daily.      . pramipexole (MIRAPEX) 0.25 MG tablet Take 1 tablet (0.25 mg total) by mouth daily.  90 tablet  3  . atovaquone-proguanil (MALARONE) 250-100 MG TABS Take 1 tablet by mouth  daily. for malaria prophylaxis: Start 2 days prior and continue for 7 days after returning from endemic area  14 tablet  0  . buPROPion (WELLBUTRIN) 75 MG tablet Take 1 tablet (75 mg total) by mouth 2 (two) times daily.  60 tablet  1  . clonazePAM (KLONOPIN) 0.5 MG tablet Take 1 tablet (0.5 mg total) by mouth 2 (two) times daily as needed for anxiety.  30 tablet  0  . diphenoxylate-atropine (LOMOTIL) 2.5-0.025 MG per tablet Take 1 tablet by mouth 4 (four) times daily as needed for diarrhea or loose stools.  30 tablet  0  . typhoid (VIVOTIF) DR capsule Take 1 capsule by mouth every other day. For typhoid prophylaxis: Complete regimen one week prior to anticipated exposure  4 capsule  0  . zolpidem (AMBIEN) 10 MG tablet Take 1 tablet (10 mg total) by mouth  at bedtime as needed for sleep.  14 tablet  0  . [DISCONTINUED] losartan (COZAAR) 50 MG tablet Take 1 tablet (50 mg total) by mouth daily.  90 tablet  3   No facility-administered encounter medications on file as of 09/14/2012.     Orders Placed This Encounter  Procedures  . HM PAP SMEAR    No Follow-up on file.

## 2012-09-16 ENCOUNTER — Encounter: Payer: Self-pay | Admitting: Internal Medicine

## 2012-09-16 DIAGNOSIS — E669 Obesity, unspecified: Secondary | ICD-10-CM | POA: Insufficient documentation

## 2012-09-16 NOTE — Assessment & Plan Note (Addendum)
40 minutes spent in discussion of symptoms  And available therapies including  the risks and benefits of Wellbutrin versus SSRIs. She understands that if  the Wellbutrin makes her more aggressive, this is a commonly reported side effect. She prefers a trial of Wellbutrin due to the fear of weight gain. I've also given her prescription for Klonopin to use Wellbutrin interferes with her ability to get much rest. Couples and individual counselling recommended.

## 2012-09-16 NOTE — Assessment & Plan Note (Signed)
I have addressed  BMI and recommended a low glycemic index diet utilizing smaller more frequent meals to increase metabolism.  I have also recommended that patient start exercising with a goal of 30 minutes of aerobic exercise a minimum of 5 days per week. Screening for lipid disorders, thyroid and diabetes has  been done  °

## 2012-10-05 ENCOUNTER — Encounter: Payer: Self-pay | Admitting: Internal Medicine

## 2012-10-05 DIAGNOSIS — F329 Major depressive disorder, single episode, unspecified: Secondary | ICD-10-CM

## 2012-10-15 MED ORDER — BUPROPION HCL ER (XL) 300 MG PO TB24: 300.0000 mg | ORAL_TABLET | Freq: Every day | ORAL | Status: DC

## 2012-11-15 ENCOUNTER — Other Ambulatory Visit: Payer: Self-pay

## 2013-01-22 ENCOUNTER — Encounter: Payer: Self-pay | Admitting: Internal Medicine

## 2013-01-22 MED ORDER — BUPROPION HCL ER (XL) 150 MG PO TB24: 150.0000 mg | ORAL_TABLET | Freq: Every day | ORAL | Status: DC

## 2013-04-05 ENCOUNTER — Encounter: Payer: Self-pay | Admitting: Internal Medicine

## 2013-04-05 DIAGNOSIS — Z1239 Encounter for other screening for malignant neoplasm of breast: Secondary | ICD-10-CM

## 2013-04-17 ENCOUNTER — Encounter: Payer: Self-pay | Admitting: Emergency Medicine

## 2013-05-13 ENCOUNTER — Encounter: Payer: Self-pay | Admitting: Internal Medicine

## 2013-05-13 ENCOUNTER — Ambulatory Visit (INDEPENDENT_AMBULATORY_CARE_PROVIDER_SITE_OTHER): Payer: 59 | Admitting: Internal Medicine

## 2013-05-13 VITALS — BP 114/76 | HR 79 | Temp 98.1°F | Resp 16 | Ht 68.0 in | Wt 195.5 lb

## 2013-05-13 DIAGNOSIS — R5381 Other malaise: Secondary | ICD-10-CM

## 2013-05-13 DIAGNOSIS — E785 Hyperlipidemia, unspecified: Secondary | ICD-10-CM

## 2013-05-13 DIAGNOSIS — E559 Vitamin D deficiency, unspecified: Secondary | ICD-10-CM

## 2013-05-13 DIAGNOSIS — Z0000 Encounter for general adult medical examination without abnormal findings: Secondary | ICD-10-CM

## 2013-05-13 DIAGNOSIS — I152 Hypertension secondary to endocrine disorders: Secondary | ICD-10-CM | POA: Insufficient documentation

## 2013-05-13 DIAGNOSIS — I10 Essential (primary) hypertension: Secondary | ICD-10-CM

## 2013-05-13 DIAGNOSIS — E119 Type 2 diabetes mellitus without complications: Secondary | ICD-10-CM | POA: Insufficient documentation

## 2013-05-13 DIAGNOSIS — R5383 Other fatigue: Secondary | ICD-10-CM

## 2013-05-13 DIAGNOSIS — E669 Obesity, unspecified: Secondary | ICD-10-CM

## 2013-05-13 DIAGNOSIS — R7309 Other abnormal glucose: Secondary | ICD-10-CM

## 2013-05-13 DIAGNOSIS — F329 Major depressive disorder, single episode, unspecified: Secondary | ICD-10-CM

## 2013-05-13 DIAGNOSIS — R739 Hyperglycemia, unspecified: Secondary | ICD-10-CM

## 2013-05-13 DIAGNOSIS — Z90710 Acquired absence of both cervix and uterus: Secondary | ICD-10-CM

## 2013-05-13 LAB — LIPID PANEL
Cholesterol: 178 mg/dL (ref 0–200)
HDL: 38.6 mg/dL — ABNORMAL LOW (ref >39.00)
LDL Cholesterol: 111 mg/dL — ABNORMAL HIGH (ref 0–99)
Total CHOL/HDL Ratio: 5
Triglycerides: 144 mg/dL (ref 0.0–149.0)
VLDL: 28.8 mg/dL (ref 0.0–40.0)

## 2013-05-13 LAB — COMPREHENSIVE METABOLIC PANEL
ALT: 18 U/L (ref 0–35)
AST: 31 U/L (ref 0–37)
Albumin: 3.7 g/dL (ref 3.5–5.2)
Alkaline Phosphatase: 52 U/L (ref 39–117)
BUN: 10 mg/dL (ref 6–23)
CO2: 21 mEq/L (ref 19–32)
Calcium: 9.3 mg/dL (ref 8.4–10.5)
Chloride: 106 mEq/L (ref 96–112)
Creatinine, Ser: 0.8 mg/dL (ref 0.4–1.2)
GFR: 79.55 mL/min (ref >60.00)
Glucose, Bld: 96 mg/dL (ref 70–99)
Potassium: 4 mEq/L (ref 3.5–5.1)
Sodium: 138 mEq/L (ref 135–145)
Total Bilirubin: 0.6 mg/dL (ref 0.3–1.2)
Total Protein: 7.1 g/dL (ref 6.0–8.3)

## 2013-05-13 LAB — CBC WITH DIFFERENTIAL/PLATELET
Basophils Absolute: 0 10*3/uL (ref 0.0–0.1)
Basophils Relative: 0.4 % (ref 0.0–3.0)
Eosinophils Absolute: 0.1 10*3/uL (ref 0.0–0.7)
Eosinophils Relative: 1.6 % (ref 0.0–5.0)
HCT: 41.7 % (ref 36.0–46.0)
Hemoglobin: 13.8 g/dL (ref 12.0–15.0)
Lymphocytes Relative: 26.2 % (ref 12.0–46.0)
Lymphs Abs: 2.3 10*3/uL (ref 0.7–4.0)
MCHC: 33.2 g/dL (ref 30.0–36.0)
MCV: 86.5 fl (ref 78.0–100.0)
Monocytes Absolute: 0.5 10*3/uL (ref 0.1–1.0)
Monocytes Relative: 5.8 % (ref 3.0–12.0)
Neutro Abs: 5.8 10*3/uL (ref 1.4–7.7)
Neutrophils Relative %: 66 % (ref 43.0–77.0)
Platelets: 281 10*3/uL (ref 150.0–400.0)
RBC: 4.82 Mil/uL (ref 3.87–5.11)
RDW: 14.3 % (ref 11.5–14.6)
WBC: 8.8 10*3/uL (ref 4.5–10.5)

## 2013-05-13 LAB — TSH: TSH: 1.04 u[IU]/mL (ref 0.35–5.50)

## 2013-05-13 LAB — HEMOGLOBIN A1C: Hgb A1c MFr Bld: 5.6 % (ref 4.6–6.5)

## 2013-05-13 MED ORDER — CEFDINIR 300 MG PO CAPS: 300.0000 mg | ORAL_CAPSULE | Freq: Two times a day (BID) | ORAL | Status: DC

## 2013-05-13 MED ORDER — PROGESTERONE MICRONIZED 200 MG PO CAPS: 200.0000 mg | ORAL_CAPSULE | Freq: Every day | ORAL | Status: DC

## 2013-05-13 MED ORDER — PRAMIPEXOLE DIHYDROCHLORIDE 0.5 MG PO TABS: 0.5000 mg | ORAL_TABLET | Freq: Every day | ORAL | Status: DC

## 2013-05-13 MED ORDER — PROMETHAZINE HCL 25 MG PO TABS: 25.0000 mg | ORAL_TABLET | Freq: Three times a day (TID) | ORAL | Status: DC | PRN

## 2013-05-13 MED ORDER — MONTELUKAST SODIUM 10 MG PO TABS: 10.0000 mg | ORAL_TABLET | Freq: Every day | ORAL | Status: DC

## 2013-05-13 MED ORDER — SCOPOLAMINE 1 MG/3DAYS TD PT72: 1.0000 | MEDICATED_PATCH | TRANSDERMAL | Status: DC

## 2013-05-13 MED ORDER — BUPROPION HCL ER (XL) 150 MG PO TB24: 150.0000 mg | ORAL_TABLET | Freq: Every day | ORAL | Status: DC

## 2013-05-13 MED ORDER — NITROFURANTOIN MONOHYD MACRO 100 MG PO CAPS: 100.0000 mg | ORAL_CAPSULE | Freq: Two times a day (BID) | ORAL | Status: DC

## 2013-05-13 NOTE — Assessment & Plan Note (Signed)
Resolved with weight loss and stress management  No meds needed.    

## 2013-05-13 NOTE — Progress Notes (Signed)
Patient ID: Patricia [name], female   DOB: 1970/05/25, 43 y.o.   MRN: [id number]   Subjective:     Patricia [name] is a 43 y.o. female and is here for a comprehensive physical exam. The patient reports as follows:Marland Kitchen  Normal pelvic exam last year,  And she is s/.p hysterectomy  Not sexually active.    Restless legs has  gotten worse, has increased dose to 0.25 but wants 0.5 mg for savings  Allergies contolled with dual therapy  Anxiety improved with separation and so is her bp.  Has lost 12 lbs since Sept. separated since January   Much less stress.  bp improved,   Kids are handling the separation well.  Her  twin daughters are doing great,  One in the [employer],  one graduating from [employer] going abroad to [place] for 2 years    Using prometrium 3 weeks/month which has been helping her insomia which she believes was due to progesterone  deficiency s/p hysterectomy   History   Social History  . Marital Status: Married    Spouse Name: N/A    Number of Children: N/A  . Years of Education: N/A   Occupational History  . Not on file.   Social History Main Topics  . Smoking status: Never Smoker   . Smokeless tobacco: Never Used  . Alcohol Use: Yes  . Drug Use: No  . Sexual Activity: Not on file   Other Topics Concern  . Not on file   Social History Narrative  . No narrative on file   Health Maintenance  Topic Date Due  . Tetanus/tdap  10/30/1988  . Pap Smear  09/14/2012  . Influenza Vaccine  08/10/2013    The following portions of the patient's history were reviewed and updated as appropriate: allergies, current medications, past family history, past medical history, past social history, past surgical history and problem list.  Review of Systems A comprehensive review of systems was negative.   Objective:   BP 114/76  Pulse 79  Temp(Src) 98.1 F (36.7 C) (Oral)  Resp 16  Ht 5\' 8"  (1.727 m)  Wt 195 lb 8 oz (88.678 kg)  BMI 29.73 kg/m2  SpO2 98%  General appearance:  alert, cooperative and appears stated age Head: Normocephalic, without obvious abnormality, atraumatic Eyes: conjunctivae/corneas clear. PERRL, EOM's intact. Fundi benign. Ears: normal TM's and external ear canals both ears Nose: Nares normal. Septum midline. Mucosa normal. No drainage or sinus tenderness. Throat: lips, mucosa, and tongue normal; teeth and gums normal Neck: no adenopathy, no carotid bruit, no JVD, supple, symmetrical, trachea midline and thyroid not enlarged, symmetric, no tenderness/mass/nodules Lungs: clear to auscultation bilaterally Breasts: normal appearance, no masses or tenderness Heart: regular rate and rhythm, S1, S2 normal, no murmur, click, rub or gallop Abdomen: soft, non-tender; bowel sounds normal; no masses,  no organomegaly Extremities: extremities normal, atraumatic, no cyanosis or edema Pulses: 2+ and symmetric Skin: Skin color, texture, turgor normal. No rashes or lesions Neurologic: Alert and oriented X 3, normal strength and tone. Normal symmetric reflexes. Normal coordination and gait.   .    Assessment and plan:    Hypertension Resolved with weight loss and stress management  No meds needed.   Major depressive disorder, single episode Managed with wellbutrin and prometrium 21 days/month.  Improved with separation from husband   Routine general medical examination at a health care facility Annual comprehensive exam was done including breast, excluding pelvic and PAP smear. All screenings have been addressed .  Obesity, unspecified BMI is improving with diet and exercise.    Updated Medication List Outpatient Encounter Prescriptions as of 05/13/2013  Medication Sig  . Ashwagandha Extract 2.5 % POWD 1 capsule by Does not apply route daily.  . Biotin 1000 MCG tablet Take 1 tablet (1 mg total) by mouth 3 (three) times daily.  Marland Kitchen buPROPion (WELLBUTRIN XL) 150 MG 24 hr tablet Take 1 tablet (150 mg total) by mouth daily.  . Cholecalciferol (HM  VITAMIN D3) 2000 UNITS CAPS Take 1 capsule by mouth daily.  . clonazePAM (KLONOPIN) 0.5 MG tablet Take 1 tablet (0.5 mg total) by mouth 2 (two) times daily as needed for anxiety.  Marland Kitchen DHEA 25 MG CAPS Take 1 capsule by mouth daily.  Marland Kitchen loratadine (CLARITIN) 10 MG tablet Take 1 tablet (10 mg total) by mouth daily.  . Nutritional Supplements (5-HTP TRYPTOPHAN) 50 MG TABS Take 1 tablet by mouth daily.  . pramipexole (MIRAPEX) 0.5 MG tablet Take 1 tablet (0.5 mg total) by mouth daily.  . progesterone (PROMETRIUM) 200 MG capsule Take 200 mg by mouth daily.  Marland Kitchen RA BEE POLLEN PO Take 1 capsule by mouth daily.  . [DISCONTINUED] buPROPion (WELLBUTRIN XL) 150 MG 24 hr tablet Take 1 tablet (150 mg total) by mouth daily.  . [DISCONTINUED] pramipexole (MIRAPEX) 0.25 MG tablet Take 1 tablet (0.25 mg total) by mouth daily.  Marland Kitchen atovaquone-proguanil (MALARONE) 250-100 MG TABS Take 1 tablet by mouth daily. for malaria prophylaxis: Start 2 days prior and continue for 7 days after returning from endemic area  . cefdinir (OMNICEF) 300 MG capsule Take 1 capsule (300 mg total) by mouth 2 (two) times daily.  . diphenoxylate-atropine (LOMOTIL) 2.5-0.025 MG per tablet Take 1 tablet by mouth 4 (four) times daily as needed for diarrhea or loose stools.  . montelukast (SINGULAIR) 10 MG tablet Take 1 tablet (10 mg total) by mouth at bedtime.  . Multiple Vitamins-Minerals (MULTIVITAMIN WITH MINERALS) tablet Take 1 tablet by mouth daily.  . nitrofurantoin, macrocrystal-monohydrate, (MACROBID) 100 MG capsule Take 1 capsule (100 mg total) by mouth 2 (two) times daily.  . progesterone (PROMETRIUM) 200 MG capsule Take 1 capsule (200 mg total) by mouth daily.  . promethazine (PHENERGAN) 25 MG tablet Take 1 tablet (25 mg total) by mouth every 8 (eight) hours as needed for nausea or vomiting.  Marland Kitchen scopolamine (TRANSDERM-SCOP) 1 MG/3DAYS Place 1 patch (1.5 mg total) onto the skin every 3 (three) days.  . typhoid (VIVOTIF) DR capsule Take 1  capsule by mouth every other day. For typhoid prophylaxis: Complete regimen one week prior to anticipated exposure  . zolpidem (AMBIEN) 10 MG tablet Take 1 tablet (10 mg total) by mouth at bedtime as needed for sleep.  . [DISCONTINUED] buPROPion (WELLBUTRIN) 75 MG tablet Take 1 tablet (75 mg total) by mouth 2 (two) times daily.  . [DISCONTINUED] montelukast (SINGULAIR) 10 MG tablet Take 1 tablet (10 mg total) by mouth at bedtime.

## 2013-05-13 NOTE — Assessment & Plan Note (Addendum)
Managed with wellbutrin and prometrium 21 days/month.  Improved with separation from husband

## 2013-05-14 ENCOUNTER — Telehealth: Payer: Self-pay | Admitting: Internal Medicine

## 2013-05-14 ENCOUNTER — Encounter: Payer: Self-pay | Admitting: Internal Medicine

## 2013-05-14 LAB — VITAMIN D 25 HYDROXY (VIT D DEFICIENCY, FRACTURES): Vit D, 25-Hydroxy: 72 ng/mL (ref 30–89)

## 2013-05-14 NOTE — Telephone Encounter (Signed)
Relevant patient education assigned to patient using Emmi. ° °

## 2013-05-14 NOTE — Assessment & Plan Note (Signed)
BMI is improving with diet and exercise.

## 2013-05-14 NOTE — Assessment & Plan Note (Signed)
Annual comprehensive exam was done including breast, excluding pelvic and PAP smear. All screenings have been addressed .  

## 2013-11-04 ENCOUNTER — Encounter: Payer: Self-pay | Admitting: *Deleted

## 2013-11-04 ENCOUNTER — Ambulatory Visit: Payer: Self-pay | Admitting: Internal Medicine

## 2013-11-04 LAB — HM MAMMOGRAPHY: HM Mammogram: NEGATIVE

## 2013-12-10 ENCOUNTER — Other Ambulatory Visit: Payer: Self-pay

## 2013-12-10 MED ORDER — PROGESTERONE MICRONIZED 200 MG PO CAPS: 200.0000 mg | ORAL_CAPSULE | Freq: Every day | ORAL | Status: DC

## 2014-01-21 ENCOUNTER — Encounter: Payer: Self-pay | Admitting: Internal Medicine

## 2014-01-21 MED ORDER — PROGESTERONE MICRONIZED 200 MG PO CAPS: 200.0000 mg | ORAL_CAPSULE | Freq: Every day | ORAL | Status: DC

## 2014-03-03 ENCOUNTER — Encounter: Payer: Self-pay | Admitting: Internal Medicine

## 2014-03-03 DIAGNOSIS — G4700 Insomnia, unspecified: Secondary | ICD-10-CM

## 2014-03-03 NOTE — Telephone Encounter (Signed)
Please advise as for script for Ambien for trip to [place].

## 2014-03-04 MED ORDER — ZOLPIDEM TARTRATE 10 MG PO TABS: 10.0000 mg | ORAL_TABLET | Freq: Every evening | ORAL | Status: DC | PRN

## 2014-03-04 NOTE — Telephone Encounter (Signed)
Ok to refill,  printed rx  

## 2014-03-10 ENCOUNTER — Encounter: Payer: Self-pay | Admitting: Internal Medicine

## 2014-03-11 ENCOUNTER — Encounter: Payer: Self-pay | Admitting: Internal Medicine

## 2014-03-12 ENCOUNTER — Other Ambulatory Visit: Payer: Self-pay | Admitting: Internal Medicine

## 2014-03-12 DIAGNOSIS — I471 Supraventricular tachycardia: Secondary | ICD-10-CM

## 2014-03-17 ENCOUNTER — Encounter: Payer: Self-pay | Admitting: Internal Medicine

## 2014-03-17 ENCOUNTER — Other Ambulatory Visit (INDEPENDENT_AMBULATORY_CARE_PROVIDER_SITE_OTHER): Payer: 59

## 2014-03-17 DIAGNOSIS — I471 Supraventricular tachycardia: Secondary | ICD-10-CM

## 2014-03-18 LAB — BASIC METABOLIC PANEL
BUN: 15 mg/dL (ref 6–23)
CO2: 25 mEq/L (ref 19–32)
Calcium: 8.8 mg/dL (ref 8.4–10.5)
Chloride: 102 mEq/L (ref 96–112)
Creatinine, Ser: 0.85 mg/dL (ref 0.40–1.20)
GFR: 77.09 mL/min (ref >60.00)
Glucose, Bld: 98 mg/dL (ref 70–99)
Potassium: 3.7 mEq/L (ref 3.5–5.1)
Sodium: 135 mEq/L (ref 135–145)

## 2014-03-18 LAB — CBC WITH DIFFERENTIAL/PLATELET
Basophils Absolute: 0.1 10*3/uL (ref 0.0–0.1)
Basophils Relative: 1 % (ref 0.0–3.0)
Eosinophils Absolute: 0.1 10*3/uL (ref 0.0–0.7)
Eosinophils Relative: 0.9 % (ref 0.0–5.0)
HCT: 39.7 % (ref 36.0–46.0)
Hemoglobin: 13.4 g/dL (ref 12.0–15.0)
Lymphocytes Relative: 25.2 % (ref 12.0–46.0)
Lymphs Abs: 2.6 10*3/uL (ref 0.7–4.0)
MCHC: 33.8 g/dL (ref 30.0–36.0)
MCV: 85.4 fl (ref 78.0–100.0)
Monocytes Absolute: 0.5 10*3/uL (ref 0.1–1.0)
Monocytes Relative: 5.2 % (ref 3.0–12.0)
Neutro Abs: 7.1 10*3/uL (ref 1.4–7.7)
Neutrophils Relative %: 67.7 % (ref 43.0–77.0)
Platelets: 278 10*3/uL (ref 150.0–400.0)
RBC: 4.65 Mil/uL (ref 3.87–5.11)
RDW: 14.1 % (ref 11.5–15.5)
WBC: 10.4 10*3/uL (ref 4.0–10.5)

## 2014-03-18 LAB — TSH: TSH: 2.19 u[IU]/mL (ref 0.35–4.50)

## 2014-03-18 LAB — MAGNESIUM: Magnesium: 1.8 mg/dL (ref 1.5–2.5)

## 2014-03-18 MED ORDER — BUPROPION HCL 75 MG PO TABS: 75.0000 mg | ORAL_TABLET | Freq: Two times a day (BID) | ORAL | Status: DC

## 2014-03-20 ENCOUNTER — Encounter: Payer: Self-pay | Admitting: Internal Medicine

## 2014-04-02 ENCOUNTER — Encounter: Payer: Self-pay | Admitting: Cardiology

## 2014-04-02 ENCOUNTER — Ambulatory Visit (INDEPENDENT_AMBULATORY_CARE_PROVIDER_SITE_OTHER): Payer: 59 | Admitting: Cardiology

## 2014-04-02 VITALS — BP 138/92 | HR 82 | Ht 69.0 in | Wt 191.0 lb

## 2014-04-02 DIAGNOSIS — R079 Chest pain, unspecified: Secondary | ICD-10-CM

## 2014-04-02 DIAGNOSIS — I499 Cardiac arrhythmia, unspecified: Secondary | ICD-10-CM | POA: Diagnosis not present

## 2014-04-02 DIAGNOSIS — I341 Nonrheumatic mitral (valve) prolapse: Secondary | ICD-10-CM | POA: Diagnosis not present

## 2014-04-02 DIAGNOSIS — E079 Disorder of thyroid, unspecified: Secondary | ICD-10-CM

## 2014-04-02 NOTE — Patient Instructions (Signed)
Your physician recommends that you continue on your current medications as directed. Please refer to the Current Medication list given to you today.   Your physician recommends that you schedule a follow-up appointment in: 1 month after tests and monitor  Your physician has recommended that you wear a holter monitor. Holter monitors are medical devices that record the heart's electrical activity. Doctors most often use these monitors to diagnose arrhythmias. Arrhythmias are problems with the speed or rhythm of the heartbeat. The monitor is a small, portable device. You can wear one while you do your normal daily activities. This is usually used to diagnose what is causing palpitations/syncope (passing out). Please call our office when you get back from [place] to set up.  Your physician has requested that you have an echocardiogram. Echocardiography is a painless test that uses sound waves to create images of your heart. It provides your doctor with information about the size and shape of your heart and how well your heart's chambers and valves are working. This procedure takes approximately one hour. There are no restrictions for this procedure.

## 2014-04-04 ENCOUNTER — Encounter: Payer: Self-pay | Admitting: Cardiology

## 2014-04-04 DIAGNOSIS — R079 Chest pain, unspecified: Secondary | ICD-10-CM | POA: Insufficient documentation

## 2014-04-04 DIAGNOSIS — I341 Nonrheumatic mitral (valve) prolapse: Secondary | ICD-10-CM | POA: Insufficient documentation

## 2014-04-04 NOTE — Assessment & Plan Note (Signed)
Simply because this could be related to palpitations, we will check an echocardiogram.

## 2014-04-04 NOTE — Assessment & Plan Note (Signed)
I wouldn't call this chest pain was an unusual sensation associated with palpitations and jitteriness in her stomach and back. Not likely related to coronary ischemia. We are checking an echocardiogram and Holter monitor.

## 2014-04-04 NOTE — Assessment & Plan Note (Signed)
She does have documented PACs on telemetry. Did not see any other arrhythmias. I would like to know what it is we would be treating, if possible she preferrs not to be on medications. As frequent as the episodes of pain, probably 40 on monitor. I did hear what may have been a midsystolic click although somewhat difficult to hear. This could be suggestive of mitral prolapse -- will check 2-D echo to exclude this potential abnormality and for other structural abnormality.Patricia [name]

## 2014-04-04 NOTE — Progress Notes (Signed)
PATIENT: Patricia [name] MRN: [id number] DOB: 07/11/1970 PCP: [name], MD  Clinic Note: Chief Complaint  Patient presents with  . other    C/o irregular heart rate, back pain and jittery feeling in stomach. Meds reviewed verbally with pt.   HPI: Patricia [name] is a 44 y.o. female with a PMH below who presents today for evaluation of irregular, rapid heart beats with some associated dizziness..  She is otherwise healthy with no major cardiac history or significant premature family history of coronary disease. She did otherwise healthy but over the last few weeks she's been having several episodes of "a funny feeling in her chest with irregular heartbeats. She says that the longest lasting episode was about 6 hours. Usually they don't last that long. It doesn't seem like the rhythm itself is sustained for 6 hours, just the bursts of irregular beats last for longer time. This does not happen usually very frequently, but over last week or 2 it has been occurring quite frequently.  She occasionally will feel little dizzy or woozy with a one-day episodes are bad. It has not actually had any true syncope.  The remainder of Cardiovascular ROS is as follows:  no chest pain or dyspnea on exertion positive for - irregular heartbeat, palpitations, rapid heart rate and An unusual sensation in her chest but not "pain " negative for - edema, loss of consciousness, murmur, orthopnea, paroxysmal nocturnal dyspnea, shortness of breath or Syncope/near syncope or TIA/amaurosis fugax :   Past Medical History  Diagnosis Date  . Thyroid disease     thyroid nodules    Prior Cardiac Evaluation and Past Surgical History: Past Surgical History  Procedure Laterality Date  . Abdominal hysterectomy  Dec 2012    secondary to fibroid, heavy bleeding   . Tonsillectomy  May 2012    [facility]    Allergies  Allergen Reactions  . Clarithromycin   . Tramadol   . Zithromax [Azithromycin]     Current  Outpatient Prescriptions  Medication Sig Dispense Refill  . Ashwagandha Extract 2.5 % POWD 1 capsule by Does not apply route daily.    . Biotin 1000 MCG tablet Take 1 tablet (1 mg total) by mouth 3 (three) times daily. 90 tablet 3  . buPROPion (WELLBUTRIN) 75 MG tablet Take 1 tablet (75 mg total) by mouth 2 (two) times daily. 60 tablet 0  . Cholecalciferol (HM VITAMIN D3) 2000 UNITS CAPS Take 1 capsule by mouth daily.    Marland Kitchen DHEA 25 MG CAPS Take 1 capsule by mouth daily.    Marland Kitchen loratadine (CLARITIN) 10 MG tablet Take 1 tablet (10 mg total) by mouth daily. 90 tablet 2  . montelukast (SINGULAIR) 10 MG tablet Take 1 tablet (10 mg total) by mouth at bedtime. 90 tablet 3  . Multiple Vitamins-Minerals (MULTIVITAMIN WITH MINERALS) tablet Take 1 tablet by mouth daily.    . Nutritional Supplements (5-HTP TRYPTOPHAN) 50 MG TABS Take 1 tablet by mouth daily.    . pramipexole (MIRAPEX) 0.5 MG tablet Take 1 tablet (0.5 mg total) by mouth daily. 90 tablet 3  . progesterone (PROMETRIUM) 200 MG capsule Take 1 capsule (200 mg total) by mouth daily. 90 capsule 1  . RA BEE POLLEN PO Take 1 capsule by mouth daily.    Marland Kitchen zolpidem (AMBIEN) 10 MG tablet Take 1 tablet (10 mg total) by mouth at bedtime as needed for sleep. 14 tablet 0   No current facility-administered medications for this visit.  History  Substance Use Topics  . Smoking status: Never Smoker   . Smokeless tobacco: Never Used  . 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 Her daughter is actually studying abroad in [place].  She is getting ready to go on a 2 week trip to visit in [place].  family history includes Alcohol abuse in her father; COPD in her father; Cancer in her paternal grandmother; Diabetes in her brother and mother; Heart failure in her father; Hyperlipidemia in her mother; Hypertension in her mother; Mental illness in her father; Mitral valve prolapse in  her father.  ROS: A comprehensive Review of Systems - was performed Review of Systems  Constitutional: Negative for malaise/fatigue.  HENT: Negative for nosebleeds.   Eyes: Negative.   Respiratory: Negative.   Cardiovascular: Positive for palpitations. Negative for claudication.  Gastrointestinal: Negative for blood in stool and melena.  Genitourinary: Negative for hematuria.  Musculoskeletal: Negative.   Neurological: Negative for headaches.  Endo/Heme/Allergies: Negative.   Psychiatric/Behavioral: Negative.   All other systems reviewed and are negative.  PHYSICAL EXAM BP 138/92 mmHg  Pulse 82  Ht 5\' 9"  (1.753 m)  Wt 191 lb (86.637 kg)  BMI 28.19 kg/m2 General appearance: alert, cooperative, appears stated age, no distress and Healthy-appearing. Will nurse and well groomed. Pleasant mood and affect Neck: no adenopathy, no carotid bruit, no JVD, supple, symmetrical, trachea midline and thyroid not enlarged, symmetric, no tenderness/mass/nodules Lungs: clear to auscultation bilaterally, normal percussion bilaterally and Nonlabored, good air movement Heart: RRR with occasional ectopy. Normal S1 and S2 with possible soft midsystolic click that is early. Otherwise no M/R./G. Abdomen: soft, non-tender; bowel sounds normal; no masses,  no organomegaly Extremities: extremities normal, atraumatic, no cyanosis or edema Pulses: 2+ and symmetric Skin: Skin color, texture, turgor normal. No rashes or lesions Neurologic: Alert and oriented X 3, normal strength and tone. Normal symmetric reflexes. Normal coordination and gait   Adult ECG Report  Rate: 82 ;  Rhythm: normal sinus rhythm and Borderline low voltage in precordial leads. Q waves in lead 3 isn't significant. Otherwise normal EKG. Normal axis, intervals and durations.  Tele strips from last PM: PACs noted - no arrhythmias.  Recent Labs:    Chemistry      Component Value Date/Time   NA 135 03/17/2014 1551   K 3.7 03/17/2014 1551     CL 102 03/17/2014 1551   CO2 25 03/17/2014 1551   BUN 15 03/17/2014 1551   CREATININE 0.85 03/17/2014 1551      Component Value Date/Time   CALCIUM 8.8 03/17/2014 1551   ALKPHOS 52 05/13/2013 1013   AST 31 05/13/2013 1013   ALT 18 05/13/2013 1013   BILITOT 0.6 05/13/2013 1013     Lab Results  Component Value Date   TSH 2.19 03/17/2014    ASSESSMENT / PLAN: A pleasant young woman with symptomatic palpitations and documented PACs on telemetry. She may potentially have short runs of PAT or PSVT. Possible midsystolic click heard on exam but no murmur. Thankfully the symptoms are relatively benign and we can wait until she returns from her trip to [place] to initiate evaluation.  Problem List Items Addressed This Visit    Irregular heart beat    She does have documented PACs on telemetry. Did not see any other arrhythmias. I would like to know what it is we would be treating, if  possible she preferrs not to be on medications. As frequent as the episodes of pain, probably 40 on monitor. I did hear what may have been a midsystolic click although somewhat difficult to hear. This could be suggestive of mitral prolapse -- will check 2-D echo to exclude this potential abnormality and for other structural abnormality..      Relevant Orders   EKG 12-Lead (Completed)   Holter monitor - 48 hour   2D Echocardiogram without contrast   Midsystolic click concerning for possible mitral valve prolapse   Pain in the chest - Primary    I wouldn't call this chest pain was an unusual sensation associated with palpitations and jitteriness in her stomach and back. Not likely related to coronary ischemia. We are checking an echocardiogram and Holter monitor.      Relevant Orders   EKG 12-Lead (Completed)   Holter monitor - 48 hour   2D Echocardiogram without contrast   Thyroid disease    TSH recently checked was normal. This would have been part of the evaluation for palpitations.         No  orders of the defined types were placed in this encounter.    Followup: ~1 months  Rennee Coyne W. Ellyn Hack, M.D., M.S. Interventional Cardiolgy [facility]

## 2014-04-04 NOTE — Assessment & Plan Note (Signed)
TSH recently checked was normal. This would have been part of the evaluation for palpitations.

## 2014-04-11 HISTORY — PX: TRANSTHORACIC ECHOCARDIOGRAM: SHX275

## 2014-04-15 ENCOUNTER — Telehealth: Payer: Self-pay

## 2014-04-15 NOTE — Telephone Encounter (Signed)
Pt states she has not heard anything regarding her monitor. Please call.

## 2014-04-16 NOTE — Telephone Encounter (Signed)
Notified [facility] regarding monitor placement, per Margaretha Sheffield will contact her today to see when is a good time to have the monitor placed. Patient contacted today and told [facility] will contact today for instructions on placement.

## 2014-04-28 ENCOUNTER — Other Ambulatory Visit: Payer: Self-pay

## 2014-04-28 ENCOUNTER — Ambulatory Visit (INDEPENDENT_AMBULATORY_CARE_PROVIDER_SITE_OTHER): Payer: 59

## 2014-04-28 DIAGNOSIS — R079 Chest pain, unspecified: Secondary | ICD-10-CM | POA: Diagnosis not present

## 2014-04-28 DIAGNOSIS — I499 Cardiac arrhythmia, unspecified: Secondary | ICD-10-CM

## 2014-05-01 ENCOUNTER — Encounter: Payer: Self-pay | Admitting: Internal Medicine

## 2014-05-02 ENCOUNTER — Telehealth: Payer: Self-pay

## 2014-05-02 NOTE — Telephone Encounter (Signed)
Advised pt's daughter of results.  Asked her to have pt call back w/ any questions or concerns.

## 2014-05-02 NOTE — Telephone Encounter (Signed)
Normal Echo     ----- Message -----     From: Baxter Flattery     Sent: 04/30/2014 11:09 AM      To: Leonie Man, MD        ECHO REPORT SCAN IN UNDER ORDER-LEVEL DOCUMENT

## 2014-05-04 HISTORY — PX: OTHER SURGICAL HISTORY: SHX169

## 2014-05-07 ENCOUNTER — Encounter: Payer: Self-pay | Admitting: Internal Medicine

## 2014-05-07 ENCOUNTER — Ambulatory Visit (INDEPENDENT_AMBULATORY_CARE_PROVIDER_SITE_OTHER): Payer: 59 | Admitting: Cardiology

## 2014-05-07 ENCOUNTER — Encounter: Payer: Self-pay | Admitting: Cardiology

## 2014-05-07 VITALS — BP 126/84 | HR 88 | Ht 69.0 in | Wt 197.5 lb

## 2014-05-07 DIAGNOSIS — Z113 Encounter for screening for infections with a predominantly sexual mode of transmission: Secondary | ICD-10-CM

## 2014-05-07 DIAGNOSIS — I491 Atrial premature depolarization: Secondary | ICD-10-CM | POA: Diagnosis not present

## 2014-05-07 DIAGNOSIS — E785 Hyperlipidemia, unspecified: Secondary | ICD-10-CM

## 2014-05-07 DIAGNOSIS — I10 Essential (primary) hypertension: Secondary | ICD-10-CM | POA: Diagnosis not present

## 2014-05-07 DIAGNOSIS — R5383 Other fatigue: Secondary | ICD-10-CM

## 2014-05-07 DIAGNOSIS — E559 Vitamin D deficiency, unspecified: Secondary | ICD-10-CM

## 2014-05-07 DIAGNOSIS — I341 Nonrheumatic mitral (valve) prolapse: Secondary | ICD-10-CM

## 2014-05-07 MED ORDER — METOPROLOL TARTRATE 25 MG PO TABS: ORAL_TABLET | ORAL | Status: DC

## 2014-05-07 NOTE — Patient Instructions (Signed)
Medication Instructions: 1) start metoprolol tartrate 25 mg 1/2 tablet (12.5 mg) to one whole tablet twice daily as needed for palpitations - if you have an episode of palpitations, take a 1/2 tablet to start with, after 3-4 hours and symptoms are no better, take an extra 1/2 tablet, then about 12 hours later, take a whole tablet to help hold off your symptoms.  - if you have an episode of palpitations, take a 1/2 tablet, after 3-4 hours and symptoms are improved, then take an extra tablet about 12 hours later to help hold off your symptoms.  Labwork: - none  Procedures/Testing: - none  Follow-Up: - Your physician wants you to follow-up in: 6 months with Dr. Kailei Haver will receive a reminder letter in the mail two months in advance. If you don't receive a letter, please call our office to schedule the follow-up appointment.  Any Additional Special Instructions Will Be Listed Below (If Applicable). - none

## 2014-05-08 ENCOUNTER — Encounter: Payer: Self-pay | Admitting: Cardiology

## 2014-05-08 DIAGNOSIS — I491 Atrial premature depolarization: Secondary | ICD-10-CM | POA: Insufficient documentation

## 2014-05-08 NOTE — Progress Notes (Signed)
PATIENT: Patricia [name] MRN: [id number] DOB: August 12, 1970 PCP: [name], MD  Clinic Note: Chief Complaint  Patient presents with  . other    F/u Echo and holter. Meds reviewed verbally with pt.   HPI: Patricia [name] is a 44 y.o. female with a PMH below who presents today for follow-up evaluation of irregular, rapid heart beats with some associated dizziness. She has been noting cyclic bouts of irregular heartbeats that prior to her last visit were occurring with increasing frequency & duration.  Definitely associated with social stressors & work. Has felt dizzy & anxious, but no near syncope or syncope.  No sustained bursts of rapid HR.  She had an echo done that was essentially normal, and wore a 48 hr Holter monitor after returning from a trip to [place] that revealed PACs & one short (5-6 beat) run of PAT.  Otherwise rare PVCs & no arrhythmias.  INTERVAL HISTORY: She actually had a great time on a trip to [place]. Interestingly, she did not have any notable palpitations while on her trip. She was extremely relaxed. She was doing lots of walking and exercising up and down hills. One day she walked as far as 19 miles. She did not notice any palpitations worsen with exertion. This was one of our tests to see if they were ischemic in nature. She was accidentally well since her return, then had a few episodes of increasing frequency of palpitations. They are to the point where they are becoming bothersome when they do occur.  The remainder of Cardiovascular ROS is as follows:  no chest pain or dyspnea on exertion positive for - irregular heartbeat, palpitations, rapid heart rate and An unusual sensation in her chest but not "pain " negative for - edema, loss of consciousness, murmur, orthopnea, paroxysmal nocturnal dyspnea, shortness of breath or Syncope/near syncope or TIA/amaurosis fugax :   Past Medical History  Diagnosis Date  . Thyroid disease     thyroid nodules  . Premature atrial  contractions     Echo 04/2014: Normal - EF 55-60%, No RWMA, Normal Vavles & Diastolic Fxn; 48 hr Monitor - Frequent PACs with1 short run of  PAT    Prior Cardiac Evaluation and Past Surgical History: Past Surgical History  Procedure Laterality Date  . Abdominal hysterectomy  Dec 2012    secondary to fibroid, heavy bleeding   . Tonsillectomy  May 2012    [facility]  . Transthoracic echocardiogram  04/2014    NORMAL.  EF 55-60%, no Regional WMA, Norml Valves, normal diastolic Fxn, normal RV / RVSP  . 48 hour holter monitor  05/04/2014    5 PVCs, 359 PACs (some with aberrant conduction) 1 short run of 3 beats PAT, no true arrhythmia    Allergies  Allergen Reactions  . Clarithromycin   . Tramadol   . Zithromax [Azithromycin]     Current Outpatient Prescriptions  Medication Sig Dispense Refill  . Ashwagandha Extract 2.5 % POWD 1 capsule by Does not apply route daily.    . Biotin 1000 MCG tablet Take 1 tablet (1 mg total) by mouth 3 (three) times daily. 90 tablet 3  . Cholecalciferol (HM VITAMIN D3) 2000 UNITS CAPS Take 1 capsule by mouth daily.    Marland Kitchen DHEA 25 MG CAPS Take 1 capsule by mouth daily.    Marland Kitchen loratadine (CLARITIN) 10 MG tablet Take 1 tablet (10 mg total) by mouth daily. 90 tablet 2  . montelukast (SINGULAIR) 10 MG tablet Take  1 tablet (10 mg total) by mouth at bedtime. 90 tablet 3  . Multiple Vitamins-Minerals (MULTIVITAMIN WITH MINERALS) tablet Take 1 tablet by mouth daily.    . Nutritional Supplements (5-HTP TRYPTOPHAN) 50 MG TABS Take 1 tablet by mouth daily.    . pramipexole (MIRAPEX) 0.5 MG tablet Take 1 tablet (0.5 mg total) by mouth daily. 90 tablet 3  . progesterone (PROMETRIUM) 200 MG capsule Take 1 capsule (200 mg total) by mouth daily. 90 capsule 1  . RA BEE POLLEN PO Take 1 capsule by mouth daily.    . metoprolol tartrate (LOPRESSOR) 25 MG tablet Take 1/2 tablet (12.5 mg) to one tablet (25 mg) twice daily as needed for palpitations 60 tablet 2   No current  facility-administered medications for this visit.   History  Substance Use Topics  . Smoking status: Never Smoker   . Smokeless tobacco: Never Used  . 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 Her daughter is actually studying abroad in [place].  She is getting ready to go on a 2 week trip to visit in [place].  family history includes Alcohol abuse in her father; COPD in her father; Cancer in her paternal grandmother; Diabetes in her brother and mother; Heart failure in her father; Hyperlipidemia in her mother; Hypertension in her mother; Mental illness in her father; Mitral valve prolapse in her father.  ROS: A comprehensive Review of Systems - was performed Review of Systems  Constitutional: Negative for malaise/fatigue.  HENT: Negative for nosebleeds.   Respiratory: Negative for cough, shortness of breath and wheezing.   Cardiovascular: Positive for palpitations.  Gastrointestinal: Negative for blood in stool and melena.  Genitourinary: Negative for hematuria.  Musculoskeletal: Negative.   Neurological: Positive for dizziness. Negative for sensory change, speech change and focal weakness.  Psychiatric/Behavioral:       She does have a stressful job. Time intensive. She has essentially cut back dramatically on caffeine.  All other systems reviewed and are negative.    PHYSICAL EXAM BP 126/84 mmHg  Pulse 88  Ht 5\' 9"  (1.753 m)  Wt 197 lb 8 oz (89.585 kg)  BMI 29.15 kg/m2 General appearance: alert, cooperative, appears stated age, no distress and Healthy-appearing. Will nurse and well groomed. Pleasant mood and affect Neck: no adenopathy, no carotid bruit, no JVD, supple, symmetrical, trachea midline and thyroid not enlarged, symmetric, no tenderness/mass/nodules Lungs: clear to auscultation bilaterally, normal percussion bilaterally and Nonlabored, good air movement Heart: RRR with occasional  ectopy. Normal S1 and S2 with possible soft midsystolic click that is early. Otherwise no M/R./G. Abdomen: soft, non-tender; bowel sounds normal; no masses,  no organomegaly Extremities: extremities normal, atraumatic, no cyanosis or edema Pulses: 2+ and symmetric Skin: Skin color, texture, turgor normal. No rashes or lesions Neurologic: Alert and oriented X 3, normal strength and tone. Normal symmetric reflexes. Normal coordination and gait   Adult ECG Report - n/a  Recent Labs:  .lastch  Lab Results  Component Value Date   TSH 2.19 03/17/2014    ASSESSMENT / PLAN: A pleasant young woman with symptomatic palpitations and documented PACs on telemetry & 48 hr moitor. She die have at least one very short burst of PAT.  Echo did not reveal MVP.     Problem List Items Addressed This Visit    Hypertension    Stable blood pressure on no medications.  Relevant Medications   metoprolol tartrate (LOPRESSOR) 25 MG tablet   Midsystolic click concerning for possible mitral valve prolapse    She no evidence of mitral prolapse on echo      Relevant Medications   metoprolol tartrate (LOPRESSOR) 25 MG tablet   Premature atrial contractions - Primary (Chronic)    These symptoms are quite symptomatic when they occur. They have been occurring in cyclic fashion. Thankfully no exacerbation with exertion suggesting that they are nonischemic in nature.  She does not want to be on long-term medications chronically, she needs to. She would like to have something for her bad episodes. We discussed different options and agreed that we would try using when necessary metoprolol.   Pt Instructions  Medication Instructions: 1) start metoprolol tartrate 25 mg 1/2 tablet (12.5 mg) to one whole tablet twice daily as needed for palpitations - if you have an episode of palpitations, take a 1/2 tablet to start with, after 3-4 hours and symptoms are no better, take an extra 1/2 tablet, then about 12 hours  later, take a whole tablet to help hold off your symptoms.  - if you have an episode of palpitations, take a 1/2 tablet, after 3-4 hours and symptoms are improved, then take an extra tablet about 12 hours later to help hold off your symptoms.   Follow-Up: - Your physician wants you to follow-up in: 6 months with Dr. Tyshea Haver will receive a reminder letter in the mail two months in advance. If you don't receive a letter, please call our office to schedule the follow-up appointment.      Relevant Medications   metoprolol tartrate (LOPRESSOR) 25 MG tablet      Meds ordered this encounter  Medications  . metoprolol tartrate (LOPRESSOR) 25 MG tablet    Sig: Take 1/2 tablet (12.5 mg) to one tablet (25 mg) twice daily as needed for palpitations    Dispense:  60 tablet    Refill:  2    Taunja Brickner W. Ellyn Hack, M.D., M.S. Interventional Cardiolgy [facility]

## 2014-05-08 NOTE — Assessment & Plan Note (Addendum)
These symptoms are quite symptomatic when they occur. They have been occurring in cyclic fashion. Thankfully no exacerbation with exertion suggesting that they are nonischemic in nature.  She does not want to be on long-term medications chronically, she needs to. She would like to have something for her bad episodes. We discussed different options and agreed that we would try using when necessary metoprolol.   Pt Instructions  Medication Instructions: 1) start metoprolol tartrate 25 mg 1/2 tablet (12.5 mg) to one whole tablet twice daily as needed for palpitations - if you have an episode of palpitations, take a 1/2 tablet to start with, after 3-4 hours and symptoms are no better, take an extra 1/2 tablet, then about 12 hours later, take a whole tablet to help hold off your symptoms.  - if you have an episode of palpitations, take a 1/2 tablet, after 3-4 hours and symptoms are improved, then take an extra tablet about 12 hours later to help hold off your symptoms.   Follow-Up: - Your physician wants you to follow-up in: 6 months with Dr. Keiley Haver will receive a reminder letter in the mail two months in advance. If you don't receive a letter, please call our office to schedule the follow-up appointment.

## 2014-05-08 NOTE — Assessment & Plan Note (Signed)
She no evidence of mitral prolapse on echo

## 2014-05-08 NOTE — Assessment & Plan Note (Signed)
Stable blood pressure on no medications.

## 2014-05-12 ENCOUNTER — Telehealth: Payer: Self-pay | Admitting: *Deleted

## 2014-05-12 ENCOUNTER — Other Ambulatory Visit (INDEPENDENT_AMBULATORY_CARE_PROVIDER_SITE_OTHER): Payer: 59

## 2014-05-12 DIAGNOSIS — Z113 Encounter for screening for infections with a predominantly sexual mode of transmission: Secondary | ICD-10-CM

## 2014-05-12 DIAGNOSIS — R739 Hyperglycemia, unspecified: Secondary | ICD-10-CM

## 2014-05-12 DIAGNOSIS — E785 Hyperlipidemia, unspecified: Secondary | ICD-10-CM

## 2014-05-12 DIAGNOSIS — E559 Vitamin D deficiency, unspecified: Secondary | ICD-10-CM

## 2014-05-12 DIAGNOSIS — R5383 Other fatigue: Secondary | ICD-10-CM

## 2014-05-12 LAB — COMPREHENSIVE METABOLIC PANEL
ALT: 14 U/L (ref 0–35)
AST: 15 U/L (ref 0–37)
Albumin: 3.4 g/dL — ABNORMAL LOW (ref 3.5–5.2)
Alkaline Phosphatase: 54 U/L (ref 39–117)
BUN: 8 mg/dL (ref 6–23)
CO2: 26 mEq/L (ref 19–32)
Calcium: 8.4 mg/dL (ref 8.4–10.5)
Chloride: 103 mEq/L (ref 96–112)
Creatinine, Ser: 0.69 mg/dL (ref 0.40–1.20)
GFR: 98 mL/min (ref >60.00)
Glucose, Bld: 108 mg/dL — ABNORMAL HIGH (ref 70–99)
Potassium: 3.9 mEq/L (ref 3.5–5.1)
Sodium: 135 mEq/L (ref 135–145)
Total Bilirubin: 0.6 mg/dL (ref 0.2–1.2)
Total Protein: 6.9 g/dL (ref 6.0–8.3)

## 2014-05-12 LAB — CBC WITH DIFFERENTIAL/PLATELET
Basophils Absolute: 0 10*3/uL (ref 0.0–0.1)
Basophils Relative: 0.5 % (ref 0.0–3.0)
Eosinophils Absolute: 0.1 10*3/uL (ref 0.0–0.7)
Eosinophils Relative: 1.5 % (ref 0.0–5.0)
HCT: 40.9 % (ref 36.0–46.0)
Hemoglobin: 13.6 g/dL (ref 12.0–15.0)
Lymphocytes Relative: 27.8 % (ref 12.0–46.0)
Lymphs Abs: 2.4 10*3/uL (ref 0.7–4.0)
MCHC: 33.1 g/dL (ref 30.0–36.0)
MCV: 87.1 fl (ref 78.0–100.0)
Monocytes Absolute: 0.6 10*3/uL (ref 0.1–1.0)
Monocytes Relative: 7 % (ref 3.0–12.0)
Neutro Abs: 5.4 10*3/uL (ref 1.4–7.7)
Neutrophils Relative %: 63.2 % (ref 43.0–77.0)
Platelets: 269 10*3/uL (ref 150.0–400.0)
RBC: 4.7 Mil/uL (ref 3.87–5.11)
RDW: 14.6 % (ref 11.5–15.5)
WBC: 8.6 10*3/uL (ref 4.0–10.5)

## 2014-05-12 LAB — LIPID PANEL
Cholesterol: 187 mg/dL (ref 0–200)
HDL: 47.5 mg/dL (ref >39.00)
LDL Cholesterol: 120 mg/dL — ABNORMAL HIGH (ref 0–99)
NonHDL: 139.5
Total CHOL/HDL Ratio: 4
Triglycerides: 97 mg/dL (ref 0.0–149.0)
VLDL: 19.4 mg/dL (ref 0.0–40.0)

## 2014-05-12 LAB — HEMOGLOBIN A1C: Hgb A1c MFr Bld: 5.8 % (ref 4.6–6.5)

## 2014-05-12 LAB — VITAMIN D 25 HYDROXY (VIT D DEFICIENCY, FRACTURES): VITD: 43.12 ng/mL (ref 30.00–100.00)

## 2014-05-12 LAB — TSH: TSH: 1.53 u[IU]/mL (ref 0.35–4.50)

## 2014-05-12 NOTE — Telephone Encounter (Signed)
Pt would like a a1c added

## 2014-05-12 NOTE — Telephone Encounter (Signed)
added

## 2014-05-13 LAB — HIV ANTIBODY (ROUTINE TESTING W REFLEX): HIV 1&2 Ab, 4th Generation: NONREACTIVE

## 2014-05-13 LAB — GC/CHLAMYDIA PROBE AMP, URINE
Chlamydia, Swab/Urine, PCR: NEGATIVE
GC Probe Amp, Urine: NEGATIVE

## 2014-05-13 LAB — HEPATITIS C ANTIBODY: HCV Ab: NEGATIVE

## 2014-05-13 LAB — HSV(HERPES SIMPLEX VRS) I + II AB-IGG
HSV 1 Glycoprotein G Ab, IgG: <0.1 IV
HSV 2 Glycoprotein G Ab, IgG: <0.1 IV

## 2014-05-13 LAB — RPR

## 2014-05-14 ENCOUNTER — Encounter: Payer: Self-pay | Admitting: Internal Medicine

## 2014-05-27 ENCOUNTER — Encounter: Payer: Self-pay | Admitting: Internal Medicine

## 2014-05-27 ENCOUNTER — Other Ambulatory Visit (HOSPITAL_COMMUNITY)
Admission: RE | Admit: 2014-05-27 | Discharge: 2014-05-27 | Disposition: A | Discharge status: Home / Self Care | Payer: 59 | Source: Ambulatory Visit | Attending: Internal Medicine | Admitting: Internal Medicine

## 2014-05-27 ENCOUNTER — Ambulatory Visit (INDEPENDENT_AMBULATORY_CARE_PROVIDER_SITE_OTHER): Payer: 59 | Admitting: Internal Medicine

## 2014-05-27 VITALS — BP 124/72 | HR 76 | Temp 98.2°F | Resp 16 | Ht 68.0 in | Wt 197.2 lb

## 2014-05-27 DIAGNOSIS — E669 Obesity, unspecified: Secondary | ICD-10-CM | POA: Diagnosis not present

## 2014-05-27 DIAGNOSIS — Z01411 Encounter for gynecological examination (general) (routine) with abnormal findings: Secondary | ICD-10-CM | POA: Diagnosis not present

## 2014-05-27 DIAGNOSIS — Z1151 Encounter for screening for human papillomavirus (HPV): Secondary | ICD-10-CM | POA: Insufficient documentation

## 2014-05-27 DIAGNOSIS — Z0000 Encounter for general adult medical examination without abnormal findings: Secondary | ICD-10-CM

## 2014-05-27 DIAGNOSIS — R87810 Cervical high risk human papillomavirus (HPV) DNA test positive: Secondary | ICD-10-CM | POA: Insufficient documentation

## 2014-05-27 DIAGNOSIS — R739 Hyperglycemia, unspecified: Secondary | ICD-10-CM | POA: Diagnosis not present

## 2014-05-27 DIAGNOSIS — Z124 Encounter for screening for malignant neoplasm of cervix: Secondary | ICD-10-CM

## 2014-05-27 MED ORDER — PROGESTERONE MICRONIZED 200 MG PO CAPS: 200.0000 mg | ORAL_CAPSULE | Freq: Every day | ORAL | Status: DC

## 2014-05-27 MED ORDER — PRAMIPEXOLE DIHYDROCHLORIDE 0.5 MG PO TABS: 0.5000 mg | ORAL_TABLET | Freq: Every day | ORAL | Status: DC

## 2014-05-27 MED ORDER — MONTELUKAST SODIUM 10 MG PO TABS: 10.0000 mg | ORAL_TABLET | Freq: Every day | ORAL | Status: DC

## 2014-05-27 NOTE — Progress Notes (Signed)
Patient ID: Patricia [name], female   DOB: 1970-11-28, 44 y.o.   MRN: [id number]

## 2014-05-27 NOTE — Progress Notes (Signed)
Pre-visit discussion using our clinic review tool. No additional management support is needed unless otherwise documented below in the visit note.  

## 2014-05-29 ENCOUNTER — Encounter: Payer: Self-pay | Admitting: Internal Medicine

## 2014-05-29 NOTE — Progress Notes (Signed)
Subjective:  Patient ID: Patricia [name], female    DOB: 1970-04-05  Age: 44 y.o. MRN: [id number]  CC: The primary encounter diagnosis was Screening for cervical cancer. Diagnoses of Obesity, Hyperglycemia, and Visit for preventive health examination were also pertinent to this visit.  HPI Patricia [name] presents for her  annual physical exam . She has no complaints today other than weight gain, which is due to lack of exercise and normal appetite.      Outpatient Prescriptions Prior to Visit  Medication Sig Dispense Refill  . Ashwagandha Extract 2.5 % POWD 1 capsule by Does not apply route daily.    . Cholecalciferol (HM VITAMIN D3) 2000 UNITS CAPS Take 1 capsule by mouth daily.    Marland Kitchen DHEA 25 MG CAPS Take 1 capsule by mouth daily.    Marland Kitchen loratadine (CLARITIN) 10 MG tablet Take 1 tablet (10 mg total) by mouth daily. 90 tablet 2  . metoprolol tartrate (LOPRESSOR) 25 MG tablet Take 1/2 tablet (12.5 mg) to one tablet (25 mg) twice daily as needed for palpitations 60 tablet 2  . Multiple Vitamins-Minerals (MULTIVITAMIN WITH MINERALS) tablet Take 1 tablet by mouth daily.    . Nutritional Supplements (5-HTP TRYPTOPHAN) 50 MG TABS Take 1 tablet by mouth daily.    Marland Kitchen RA BEE POLLEN PO Take 1 capsule by mouth daily.    . pramipexole (MIRAPEX) 0.5 MG tablet Take 1 tablet (0.5 mg total) by mouth daily. 90 tablet 3  . progesterone (PROMETRIUM) 200 MG capsule Take 1 capsule (200 mg total) by mouth daily. 90 capsule 1  . Biotin 1000 MCG tablet Take 1 tablet (1 mg total) by mouth 3 (three) times daily. (Patient not taking: Reported on 05/27/2014) 90 tablet 3  . montelukast (SINGULAIR) 10 MG tablet Take 1 tablet (10 mg total) by mouth at bedtime. 90 tablet 3   No facility-administered medications prior to visit.    Review of Systems;  Patient denies headache, fevers, malaise, unintentional weight loss, skin rash, eye pain, sinus congestion and sinus pain, sore throat, dysphagia,  hemoptysis , cough,  dyspnea, wheezing, chest pain, palpitations, orthopnea, edema, abdominal pain, nausea, melena, diarrhea, constipation, flank pain, dysuria, hematuria, urinary  Frequency, nocturia, numbness, tingling, seizures,  Focal weakness, Loss of consciousness,  Tremor, insomnia, depression, anxiety, and suicidal ideation.      Objective:  BP 124/72 mmHg  Pulse 76  Temp(Src) 98.2 F (36.8 C) (Oral)  Resp 16  Ht 5\' 8"  (1.727 m)  Wt 197 lb 4 oz (89.472 kg)  BMI 30.00 kg/m2  SpO2 99%  BP Readings from Last 3 Encounters:  05/27/14 124/72  05/07/14 126/84  04/02/14 138/92    Wt Readings from Last 3 Encounters:  05/27/14 197 lb 4 oz (89.472 kg)  05/07/14 197 lb 8 oz (89.585 kg)  04/02/14 191 lb (86.637 kg)   General Appearance:    Alert, cooperative, no distress, appears stated age  Head:    Normocephalic, without obvious abnormality, atraumatic  Eyes:    PERRL, conjunctiva/corneas clear, EOM's intact, fundi    benign, both eyes  Ears:    Normal TM's and external ear canals, both ears  Nose:   Nares normal, septum midline, mucosa normal, no drainage    or sinus tenderness  Throat:   Lips, mucosa, and tongue normal; teeth and gums normal  Neck:   Supple, symmetrical, trachea midline, no adenopathy;    thyroid:  no enlargement/tenderness/nodules; no carotid   bruit or JVD  Back:  Symmetric, no curvature, ROM normal, no CVA tenderness  Lungs:     Clear to auscultation bilaterally, respirations unlabored  Chest Wall:    No tenderness or deformity   Heart:    Regular rate and rhythm, S1 and S2 normal, no murmur, rub   or gallop  Breast Exam:    No tenderness, masses, or nipple abnormality  Abdomen:     Soft, non-tender, bowel sounds active all four quadrants,    no masses, no organomegaly  Genitalia:    Pelvic: cervix surgically absent  external genitalia normal, no adnexal masses or tenderness, no cervical motion tenderness, rectovaginal septum normal, uterus normal size, shape, and  consistency and vagina normal without discharge  Extremities:   Extremities normal, atraumatic, no cyanosis or edema  Pulses:   2+ and symmetric all extremities  Skin:   Skin color, texture, turgor normal, no rashes or lesions  Lymph nodes:   Cervical, supraclavicular, and axillary nodes normal  Neurologic:   CNII-XII intact, normal strength, sensation and reflexes    throughout     Lab Results  Component Value Date   HGBA1C 5.8 05/12/2014   HGBA1C 5.6 05/13/2013    Lab Results  Component Value Date   CREATININE 0.69 05/12/2014   CREATININE 0.85 03/17/2014   CREATININE 0.8 05/13/2013    Lab Results  Component Value Date   WBC 8.6 05/12/2014   HGB 13.6 05/12/2014   HCT 40.9 05/12/2014   PLT 269.0 05/12/2014   GLUCOSE 108* 05/12/2014   CHOL 187 05/12/2014   TRIG 97.0 05/12/2014   HDL 47.50 05/12/2014   LDLCALC 120* 05/12/2014   ALT 14 05/12/2014   AST 15 05/12/2014   NA 135 05/12/2014   K 3.9 05/12/2014   CL 103 05/12/2014   CREATININE 0.69 05/12/2014   BUN 8 05/12/2014   CO2 26 05/12/2014   TSH 1.53 05/12/2014   HGBA1C 5.8 05/12/2014   MICROALBUR 0.9 05/08/2012    No results found.  Assessment & Plan:   Problem List Items Addressed This Visit    Visit for preventive health examination    Annual wellness  exam was done as well as a comprehensive physical exam and management of acute and chronic conditions .  During the course of the visit the patient was educated and counseled about appropriate screening and preventive services including :  diabetes screening, lipid analysis with projected  10 year  risk for CAD , nutrition counseling, colorectal cancer screening, and recommended immunizations.  Printed recommendations for health maintenance screenings was given.       Obesity    I have addressed  BMI and recommended a low glycemic index diet utilizing smaller more frequent meals to increase metabolism.  I have also recommended that patient start exercising with  a goal of 30 minutes of aerobic exercise a minimum of 5 days per week. Screening for lipid disorders, thyroid and diabetes has been done.       Hyperglycemia    A1cs are normal, lifestyle and low glycemic index diet advised.          Other Visit Diagnoses    Screening for cervical cancer    -  Primary    Relevant Orders    Cytology - PAP       I am having Patricia [name] maintain her multivitamin with minerals, loratadine, Biotin, RA BEE POLLEN PO, Ashwagandha Extract, 5-HTP Tryptophan, DHEA, Cholecalciferol, metoprolol tartrate, pramipexole, montelukast, and progesterone.  Meds ordered this encounter  Medications  .  pramipexole (MIRAPEX) 0.5 MG tablet    Sig: Take 1 tablet (0.5 mg total) by mouth daily.    Dispense:  90 tablet    Refill:  3  . montelukast (SINGULAIR) 10 MG tablet    Sig: Take 1 tablet (10 mg total) by mouth at bedtime.    Dispense:  90 tablet    Refill:  3  . progesterone (PROMETRIUM) 200 MG capsule    Sig: Take 1 capsule (200 mg total) by mouth daily.    Dispense:  90 capsule    Refill:  2    KEEP ON FILE FOR FUTURE REFILLS    Medications Discontinued During This Encounter  Medication Reason  . pramipexole (MIRAPEX) 0.5 MG tablet Reorder  . montelukast (SINGULAIR) 10 MG tablet Reorder  . progesterone (PROMETRIUM) 200 MG capsule Reorder    Follow-up: No Follow-up on file.   [name], MD

## 2014-05-29 NOTE — Assessment & Plan Note (Signed)
I have addressed  BMI and recommended a low glycemic index diet utilizing smaller more frequent meals to increase metabolism.  I have also recommended that patient start exercising with a goal of 30 minutes of aerobic exercise a minimum of 5 days per week. Screening for lipid disorders, thyroid and diabetes has  been done  °

## 2014-05-29 NOTE — Assessment & Plan Note (Signed)
Resolved with weight loss and stress management  No meds needed.

## 2014-05-29 NOTE — Assessment & Plan Note (Signed)

## 2014-05-29 NOTE — Assessment & Plan Note (Signed)
A1cs are normal, lifestyle and low glycemic index diet advised.

## 2014-05-30 LAB — CYTOLOGY - PAP

## 2014-06-04 ENCOUNTER — Encounter: Payer: Self-pay | Admitting: Internal Medicine

## 2014-06-12 ENCOUNTER — Other Ambulatory Visit: Payer: Self-pay

## 2014-06-12 DIAGNOSIS — I499 Cardiac arrhythmia, unspecified: Secondary | ICD-10-CM

## 2014-06-12 DIAGNOSIS — R079 Chest pain, unspecified: Secondary | ICD-10-CM

## 2014-07-29 ENCOUNTER — Ambulatory Visit: Payer: 59 | Admitting: Podiatry

## 2014-08-18 ENCOUNTER — Ambulatory Visit (INDEPENDENT_AMBULATORY_CARE_PROVIDER_SITE_OTHER): Payer: 59 | Admitting: Podiatry

## 2014-08-18 ENCOUNTER — Ambulatory Visit (INDEPENDENT_AMBULATORY_CARE_PROVIDER_SITE_OTHER): Payer: 59

## 2014-08-18 ENCOUNTER — Encounter: Payer: Self-pay | Admitting: Podiatry

## 2014-08-18 VITALS — BP 130/91 | HR 81 | Resp 16 | Ht 68.0 in | Wt 190.0 lb

## 2014-08-18 DIAGNOSIS — M79674 Pain in right toe(s): Secondary | ICD-10-CM | POA: Diagnosis not present

## 2014-08-18 DIAGNOSIS — M779 Enthesopathy, unspecified: Secondary | ICD-10-CM

## 2014-08-18 MED ORDER — METHYLPREDNISOLONE 4 MG PO TBPK
ORAL_TABLET | ORAL | Status: DC
Start: 2014-08-18 — End: 2015-03-07

## 2014-08-18 MED ORDER — MELOXICAM 15 MG PO TABS: 15.0000 mg | ORAL_TABLET | Freq: Every day | ORAL | Status: DC

## 2014-08-18 NOTE — Progress Notes (Signed)
   Subjective:    Patient ID: Patricia [name], female    DOB: February 22, 1970, 44 y.o.   MRN: [id number]  HPI The right foot in between the big toe and the second toe has been bothering me, it has been going on for about 2 months, it feels like a bruise , very tender if mashed on it. Closed in shoes bother me    Review of Systems     Objective:   Physical Exam I have reviewed her past medical history medications allergies surgery social history review of systems and chief complaint as well as statement. Neurologic sensorium is intact per Semmes-Weinstein monofilament. Pulses are palpable bilateral with capillary fill time 1 through 5 bilateral. Deep tendon reflexes are intact bilateral and muscle strength is 5 over 5 dorsiflexion plantar flexors and inverters and everters all intrinsic musculature is intact. Orthopedic evaluation demonstrates pain on palpation and range of motion second metatarsophalangeal joint right foot. Radiographs demonstrated an elongated second metatarsal. No deviation of the toe as of yet.        Assessment & Plan:  Assessment: Capsulitis second metatarsophalangeal joint right foot.  Plan: Discussed etiology pathology conservative versus surgical therapies. Injected Kenalog and local and aesthetic today secondary metatarsophalangeal joint right foot after sterile Betadine skin prep. Tolerated procedure well discussed appropriate shoe gear stretching size ice therapy shoe modifications. Dispensed a prescription for Medrol Dosepak to be followed by meloxicam. Follow up with her in 1 month.

## 2014-08-19 ENCOUNTER — Ambulatory Visit: Payer: 59 | Admitting: Podiatry

## 2014-08-19 ENCOUNTER — Other Ambulatory Visit: Payer: Self-pay | Admitting: Obstetrics and Gynecology

## 2014-08-19 MED ORDER — ONDANSETRON 4 MG PO TBDP: 4.0000 mg | ORAL_TABLET | Freq: Three times a day (TID) | ORAL | Status: DC | PRN

## 2014-08-19 NOTE — Progress Notes (Signed)
Patient complains of moderate nausea after receiving steroid injections for foot.  Cannot tolerate Phenergan during the day due to extreme drowsiness.  Will send prescription for Zofran.

## 2014-09-22 ENCOUNTER — Ambulatory Visit (INDEPENDENT_AMBULATORY_CARE_PROVIDER_SITE_OTHER): Payer: 59 | Admitting: Podiatry

## 2014-09-22 ENCOUNTER — Encounter: Payer: Self-pay | Admitting: Podiatry

## 2014-09-22 VITALS — BP 135/69 | HR 78 | Resp 12

## 2014-09-22 DIAGNOSIS — M779 Enthesopathy, unspecified: Secondary | ICD-10-CM

## 2014-09-22 NOTE — Progress Notes (Signed)
She presents today for follow-up of numbness and tingling and capsulitis second metatarsophalangeal joint right foot. She states that it is considerably better.  Objective: Vital signs are stable she is alert and oriented 3 pulses are palpable. She has very little tenderness on palpation or range of motion of the second metatarsophalangeal joint. No skin breakdown.  Assessment: Resolving capsulitis second metatarsophalangeal joint right foot.  Plan: Discussed the etiology pathology conservative versus surgical therapies. I encouraged her to continue her antibiotic inflammatory's and should this worsen then we will consider orthotics.

## 2014-09-23 ENCOUNTER — Encounter: Payer: Self-pay | Admitting: Internal Medicine

## 2014-09-23 DIAGNOSIS — G4700 Insomnia, unspecified: Secondary | ICD-10-CM

## 2014-09-23 NOTE — Telephone Encounter (Signed)
OK to refill ambien

## 2014-09-24 MED ORDER — ZOLPIDEM TARTRATE 10 MG PO TABS: 10.0000 mg | ORAL_TABLET | Freq: Every evening | ORAL | Status: DC | PRN

## 2014-10-20 ENCOUNTER — Encounter: Payer: 59 | Admitting: Podiatry

## 2014-11-03 ENCOUNTER — Encounter: Payer: Self-pay | Admitting: Internal Medicine

## 2014-11-21 ENCOUNTER — Other Ambulatory Visit: Payer: Self-pay | Admitting: Obstetrics and Gynecology

## 2014-11-21 DIAGNOSIS — L739 Follicular disorder, unspecified: Secondary | ICD-10-CM

## 2014-11-21 MED ORDER — MUPIROCIN 2 % EX OINT: 1.0000 "application " | TOPICAL_OINTMENT | Freq: Three times a day (TID) | CUTANEOUS | Status: DC

## 2014-11-21 NOTE — Progress Notes (Signed)
Patient with left groin rash x 2 days. Folliculitis.

## 2015-02-09 DIAGNOSIS — H52223 Regular astigmatism, bilateral: Secondary | ICD-10-CM | POA: Diagnosis not present

## 2015-02-09 DIAGNOSIS — H524 Presbyopia: Secondary | ICD-10-CM | POA: Diagnosis not present

## 2015-02-09 DIAGNOSIS — H5203 Hypermetropia, bilateral: Secondary | ICD-10-CM | POA: Diagnosis not present

## 2015-02-24 ENCOUNTER — Encounter: Payer: Self-pay | Admitting: Internal Medicine

## 2015-03-05 ENCOUNTER — Ambulatory Visit (INDEPENDENT_AMBULATORY_CARE_PROVIDER_SITE_OTHER): Payer: 59 | Admitting: Internal Medicine

## 2015-03-05 ENCOUNTER — Encounter: Payer: Self-pay | Admitting: Internal Medicine

## 2015-03-05 VITALS — BP 126/78 | HR 76 | Temp 98.4°F | Resp 16 | Ht 68.0 in | Wt 208.0 lb

## 2015-03-05 DIAGNOSIS — M5441 Lumbago with sciatica, right side: Secondary | ICD-10-CM | POA: Diagnosis not present

## 2015-03-05 DIAGNOSIS — M7061 Trochanteric bursitis, right hip: Secondary | ICD-10-CM | POA: Diagnosis not present

## 2015-03-05 DIAGNOSIS — M25551 Pain in right hip: Secondary | ICD-10-CM | POA: Diagnosis not present

## 2015-03-05 DIAGNOSIS — E669 Obesity, unspecified: Secondary | ICD-10-CM | POA: Diagnosis not present

## 2015-03-05 MED ORDER — CYCLOBENZAPRINE HCL 10 MG PO TABS: 10.0000 mg | ORAL_TABLET | Freq: Three times a day (TID) | ORAL | Status: DC | PRN

## 2015-03-05 MED ORDER — DICLOFENAC SODIUM 75 MG PO TBEC: 75.0000 mg | DELAYED_RELEASE_TABLET | Freq: Two times a day (BID) | ORAL | Status: DC

## 2015-03-05 NOTE — Patient Instructions (Addendum)
X rays of lumbar spine and right   Diclofenac 75 mg bid   No walking , just gentle  stretching   Referral to [employer] sports medicien  The  diet I discussed with you today is the 10 day Green Smoothie Cleansing /Detox Diet by Linden Dolin . available on [place] for around $10.  It does require a blender, (Vita Mix, a electric juicer,  Or a Nutribullet Rx).  This is not a low carb or a weight loss diet,  It is fundamentally a "cleansing" low fat diet that eliminates sugar, gluten, caffeine, alcohol and dairy for 10 days .  What you add back after the initial ten days is entirely up to  you!  You can expect to lose 5 to 10 lbs depending on how strict you are.   I found that  drinking 2 smoothies or juices  daily and keeping one chewable meal (but keep it simple, like baked fish and salad, rice or bok choy) kept me satisfied and kept me from straying  .  You snack primarily on fresh  fruit, egg whites and judicious quantities of nuts.  You can add a  vegetable based protein powder  to any smoothie made with almond milk (nothing with whey , since whey is dairy)  [employer] has a few but  the [employer] has the greatest  selection .  Using frozen fruits is much more convenient and cost effective. You can even find plenty of organic fruit in the frozen fruit section of [employer].  Just thaw what you need for the following day the night before in the refrigerator (to avoid jamming up your machine)   I  tried an  organic vegan protein powder I tried  is called " Vega" and I found it at [employer] .  It is sugar free. Tastes like crap.  My advice:  Sarina Ser your protein  (eat an egg or two in the am with your smoothie or add soy yogurt for protein ) ,  Don't ruin the taste of your smoothies with protein powder unless you can find one you really love.

## 2015-03-05 NOTE — Progress Notes (Signed)
Subjective:  Patient ID: Patricia [name], female    DOB: 05-16-1970  Age: 45 y.o. MRN: [id number]  CC: The primary encounter diagnosis was Hip pain, acute, right. Diagnoses of Bilateral low back pain with right-sided sciatica, Trochanteric bursitis of right hip, and Obesity were also pertinent to this visit.  HPI Patricia [name] presents for persistent right hip pain which has been present for the past month.  She states that her lateral thigh has been sore to the touch along the IT band and the worse every nigh.  Throbs like a tooth ache.  NO history of fall,  Unusual activity.  Has been seeing a massage therapist ,who has noted that her muscles are really tight.  Massage provides relief for 2 days.  TENS provides relief for one night .  She has also seen a Chiropractor:   No alignment issues   Has been using 10 mg flexeril and 800 mg motrin every night for the last 2 weeks, but not taking any NSAIDs during the day.    Outpatient Prescriptions Prior to Visit  Medication Sig Dispense Refill  . Ashwagandha Extract 2.5 % POWD 1 capsule by Does not apply route daily.    . Biotin 1000 MCG tablet Take 1 tablet (1 mg total) by mouth 3 (three) times daily. 90 tablet 3  . Cholecalciferol (HM VITAMIN D3) 2000 UNITS CAPS Take 1 capsule by mouth daily.    Marland Kitchen DHEA 25 MG CAPS Take 1 capsule by mouth daily.    Marland Kitchen loratadine (CLARITIN) 10 MG tablet Take 1 tablet (10 mg total) by mouth daily. 90 tablet 2  . montelukast (SINGULAIR) 10 MG tablet Take 1 tablet (10 mg total) by mouth at bedtime. 90 tablet 3  . Nutritional Supplements (5-HTP TRYPTOPHAN) 50 MG TABS Take 1 tablet by mouth daily.    . pramipexole (MIRAPEX) 0.5 MG tablet Take 1 tablet (0.5 mg total) by mouth daily. 90 tablet 3  . progesterone (PROMETRIUM) 200 MG capsule Take 1 capsule (200 mg total) by mouth daily. 90 capsule 2  . RA BEE POLLEN PO Take 1 capsule by mouth daily.    Marland Kitchen zolpidem (AMBIEN) 10 MG tablet Take 1 tablet (10 mg total) by  mouth at bedtime as needed for sleep. 30 tablet 1  . Multiple Vitamins-Minerals (MULTIVITAMIN WITH MINERALS) tablet Take 1 tablet by mouth daily. Reported on 03/05/2015    . meloxicam (MOBIC) 15 MG tablet Take 1 tablet (15 mg total) by mouth daily. (Patient not taking: Reported on 03/05/2015) 30 tablet 3  . methylPREDNISolone (MEDROL) 4 MG TBPK tablet Tapering 6 day dose pack (Patient not taking: Reported on 03/05/2015) 21 tablet 0  . metoprolol tartrate (LOPRESSOR) 25 MG tablet Take 1/2 tablet (12.5 mg) to one tablet (25 mg) twice daily as needed for palpitations (Patient not taking: Reported on 03/05/2015) 60 tablet 2  . mupirocin ointment (BACTROBAN) 2 % Apply 1 application topically 3 (three) times daily. To affected area for up to 10 days (Patient not taking: Reported on 03/05/2015) 22 g 0  . ondansetron (ZOFRAN ODT) 4 MG disintegrating tablet Take 1 tablet (4 mg total) by mouth every 8 (eight) hours as needed for nausea or vomiting. (Patient not taking: Reported on 03/05/2015) 30 tablet 0   No facility-administered medications prior to visit.    Review of Systems;  Patient denies headache, fevers, malaise, unintentional weight loss, skin rash, eye pain, sinus congestion and sinus pain, sore throat, dysphagia,  hemoptysis , cough, dyspnea,  wheezing, chest pain, palpitations, orthopnea, edema, abdominal pain, nausea, melena, diarrhea, constipation, flank pain, dysuria, hematuria, urinary  Frequency, nocturia, numbness, tingling, seizures,  Focal weakness, Loss of consciousness,  Tremor, insomnia, depression, anxiety, and suicidal ideation.      Objective:  BP 126/78 mmHg  Pulse 76  Temp(Src) 98.4 F (36.9 C) (Oral)  Resp 16  Ht 5\' 8"  (1.727 m)  Wt 208 lb (94.348 kg)  BMI 31.63 kg/m2  SpO2 97%  BP Readings from Last 3 Encounters:  03/05/15 126/78  09/22/14 135/69  08/18/14 130/91    Wt Readings from Last 3 Encounters:  03/05/15 208 lb (94.348 kg)  08/18/14 190 lb (86.183 kg)    05/27/14 197 lb 4 oz (89.472 kg)    General appearance: alert, cooperative and appears stated age Ears: normal TM's and external ear canals both ears Throat: lips, mucosa, and tongue normal; teeth and gums normal Neck: no adenopathy, no carotid bruit, supple, symmetrical, trachea midline and thyroid not enlarged, symmetric, no tenderness/mass/nodules Back: symmetric, no curvature. ROM normal. No CVA tenderness. Lungs: clear to auscultation bilaterally Heart: regular rate and rhythm, S1, S2 normal, no murmur, click, rub or gallop Abdomen: soft, non-tender; bowel sounds normal; no masses,  no organomegaly Pulses: 2+ and symmetric Skin: Skin color, texture, turgor normal. No rashes or lesions Lymph nodes: Cervical, supraclavicular, and axillary nodes normal.  Lab Results  Component Value Date   HGBA1C 5.8 05/12/2014   HGBA1C 5.6 05/13/2013    Lab Results  Component Value Date   CREATININE 0.69 05/12/2014   CREATININE 0.85 03/17/2014   CREATININE 0.8 05/13/2013    Lab Results  Component Value Date   WBC 8.6 05/12/2014   HGB 13.6 05/12/2014   HCT 40.9 05/12/2014   PLT 269.0 05/12/2014   GLUCOSE 108* 05/12/2014   CHOL 187 05/12/2014   TRIG 97.0 05/12/2014   HDL 47.50 05/12/2014   LDLCALC 120* 05/12/2014   ALT 14 05/12/2014   AST 15 05/12/2014   NA 135 05/12/2014   K 3.9 05/12/2014   CL 103 05/12/2014   CREATININE 0.69 05/12/2014   BUN 8 05/12/2014   CO2 26 05/12/2014   TSH 1.53 05/12/2014   HGBA1C 5.8 05/12/2014   MICROALBUR 0.9 05/08/2012    No results found.  Assessment & Plan:   Problem List Items Addressed This Visit    Obesity    I have addressed  BMI and recommended wt loss of 10% of body weight over the next 6 months using a low glycemic index diet and regular exercise a minimum of 5 days per week.        Trochanteric bursitis of right hip    Vs referred pain from lumbar spine.  Plain films og spine and hip ordered.  Will treat with diclofenac 75 mg  bid and refer to sports medicine.        Other Visit Diagnoses    Hip pain, acute, right    -  Primary    Relevant Orders    DG HIP UNILAT WITH PELVIS 2-3 VIEWS RIGHT    Bilateral low back pain with right-sided sciatica        Relevant Medications    cyclobenzaprine (FLEXERIL) 10 MG tablet    diclofenac (VOLTAREN) 75 MG EC tablet    Other Relevant Orders    DG Lumbar Spine Complete      A total of 25 minutes of face to face time was spent with patient more than half of which was  spent in counselling about the above mentioned conditions  and coordination of care  I have discontinued Ms. Michele's metoprolol tartrate, methylPREDNISolone, meloxicam, ondansetron, and mupirocin ointment. I am also having her start on cyclobenzaprine and diclofenac. Additionally, I am having her maintain her multivitamin with minerals, loratadine, Biotin, RA BEE POLLEN PO, Ashwagandha Extract, 5-HTP Tryptophan, DHEA, Cholecalciferol, pramipexole, montelukast, progesterone, and zolpidem.  Meds ordered this encounter  Medications  . cyclobenzaprine (FLEXERIL) 10 MG tablet    Sig: Take 1 tablet (10 mg total) by mouth 3 (three) times daily as needed for muscle spasms.    Dispense:  90 tablet    Refill:  2  . diclofenac (VOLTAREN) 75 MG EC tablet    Sig: Take 1 tablet (75 mg total) by mouth 2 (two) times daily.    Dispense:  60 tablet    Refill:  3    Medications Discontinued During This Encounter  Medication Reason  . meloxicam (MOBIC) 15 MG tablet   . methylPREDNISolone (MEDROL) 4 MG TBPK tablet   . metoprolol tartrate (LOPRESSOR) 25 MG tablet   . ondansetron (ZOFRAN ODT) 4 MG disintegrating tablet   . mupirocin ointment (BACTROBAN) 2 %     Follow-up: No Follow-up on file.   [name], MD

## 2015-03-07 DIAGNOSIS — M7072 Other bursitis of hip, left hip: Secondary | ICD-10-CM | POA: Insufficient documentation

## 2015-03-07 DIAGNOSIS — M7061 Trochanteric bursitis, right hip: Secondary | ICD-10-CM | POA: Insufficient documentation

## 2015-03-07 NOTE — Assessment & Plan Note (Addendum)
Vs referred pain from lumbar spine.  Plain films og spine and hip ordered.  Will treat with diclofenac 75 mg bid and refer to sports medicine.

## 2015-03-07 NOTE — Assessment & Plan Note (Signed)
I have addressed  BMI and recommended wt loss of 10% of body weight over the next 6 months using a low glycemic index diet and regular exercise a minimum of 5 days per week.   

## 2015-03-10 ENCOUNTER — Encounter: Payer: Self-pay | Admitting: Internal Medicine

## 2015-03-10 ENCOUNTER — Ambulatory Visit
Admission: RE | Admit: 2015-03-10 | Discharge: 2015-03-10 | Disposition: A | Discharge status: Home / Self Care | Payer: 59 | Source: Ambulatory Visit | Attending: Internal Medicine | Admitting: Internal Medicine

## 2015-03-10 DIAGNOSIS — M5136 Other intervertebral disc degeneration, lumbar region: Secondary | ICD-10-CM | POA: Insufficient documentation

## 2015-03-10 DIAGNOSIS — M4186 Other forms of scoliosis, lumbar region: Secondary | ICD-10-CM | POA: Insufficient documentation

## 2015-03-10 DIAGNOSIS — M25551 Pain in right hip: Secondary | ICD-10-CM

## 2015-03-10 DIAGNOSIS — M799 Soft tissue disorder, unspecified: Secondary | ICD-10-CM | POA: Diagnosis not present

## 2015-03-10 DIAGNOSIS — M47816 Spondylosis without myelopathy or radiculopathy, lumbar region: Secondary | ICD-10-CM | POA: Diagnosis not present

## 2015-03-10 DIAGNOSIS — M5441 Lumbago with sciatica, right side: Secondary | ICD-10-CM

## 2015-03-30 ENCOUNTER — Ambulatory Visit (INDEPENDENT_AMBULATORY_CARE_PROVIDER_SITE_OTHER): Payer: 59 | Admitting: Family Medicine

## 2015-03-30 ENCOUNTER — Other Ambulatory Visit (INDEPENDENT_AMBULATORY_CARE_PROVIDER_SITE_OTHER): Payer: 59

## 2015-03-30 ENCOUNTER — Encounter: Payer: Self-pay | Admitting: Family Medicine

## 2015-03-30 VITALS — BP 122/74 | HR 82 | Ht 69.0 in | Wt 212.0 lb

## 2015-03-30 DIAGNOSIS — M7061 Trochanteric bursitis, right hip: Secondary | ICD-10-CM | POA: Diagnosis not present

## 2015-03-30 DIAGNOSIS — M5136 Other intervertebral disc degeneration, lumbar region: Secondary | ICD-10-CM | POA: Diagnosis not present

## 2015-03-30 DIAGNOSIS — M51369 Other intervertebral disc degeneration, lumbar region without mention of lumbar back pain or lower extremity pain: Secondary | ICD-10-CM | POA: Insufficient documentation

## 2015-03-30 DIAGNOSIS — M25551 Pain in right hip: Secondary | ICD-10-CM

## 2015-03-30 MED ORDER — VITAMIN D (ERGOCALCIFEROL) 1.25 MG (50000 UNIT) PO CAPS: 50000.0000 [IU] | ORAL_CAPSULE | ORAL | Status: DC

## 2015-03-30 MED ORDER — DICLOFENAC SODIUM 2 % TD SOLN: TRANSDERMAL | Status: DC

## 2015-03-30 NOTE — Patient Instructions (Addendum)
Good to see you.  Ice 20 minutes 2 times daily. Usually after activity and before bed. Exercises 3 times a week.  Good shoes with rigid bottom.  Patricia [name], [place] or New balance greater then 700 pennsaid pinkie amount topically 2 times daily as needed.  Turmeric 500mg  twice daily  Vitamin D once weekly for 12 weeks.  Stay active but consider less intense exercise like biking and elliptical at first but if walking start at 50% of what you were doing and increase 25% a week.  See me again in 3 weeks if not completely gone or if back still hurts and we will look at that more closely.

## 2015-03-30 NOTE — Progress Notes (Signed)
Pre visit review using our clinic review tool, if applicable. No additional management support is needed unless otherwise documented below in the visit note. 

## 2015-03-30 NOTE — Progress Notes (Signed)
[name] [address], [place] [address] Phone: [phone] Subjective:    I'm seeing this patient by the request  of:  TULLO, TERESA L, MD   CC: right hip pain  [id number] Patricia [name] is a 45 y.o. female coming in with complaint of right hip pain. Did see primary care provider. Patient did have x-rays that were independently visualized by me today. Patient's x-rays of the right hip shows a calcific bursitis otherwise unremarkable. Patient did have low back x-rays that showed degenerative disc disease that was mild in nature. Patient was given a prescription for anti-inflammatories that she has used intermittently with mild benefit. Patient has tried icing and heat with no significant improvement. Has stopped working out secondary to the discomfort. States that the pain does wake her up at night. Rates the severity of pain a 7 out of 10     Past Medical History  Diagnosis Date  . Thyroid disease     thyroid nodules  . Premature atrial contractions     Echo 04/2014: Normal - EF 55-60%, No RWMA, Normal Vavles & Diastolic Fxn; 48 hr Monitor - Frequent PACs with1 short run of  PAT   Past Surgical History  Procedure Laterality Date  . Abdominal hysterectomy  Dec 2012    secondary to fibroid, heavy bleeding   . Tonsillectomy  May 2012    [facility]  . Transthoracic echocardiogram  04/2014    NORMAL.  EF 55-60%, no Regional WMA, Norml Valves, normal diastolic Fxn, normal RV / RVSP  . 48 hour holter monitor  05/04/2014    5 PVCs, 359 PACs (some with aberrant conduction) 1 short run of 3 beats PAT, no true arrhythmia   Social History   Social History  . Marital Status: Married    Spouse Name: N/A  . Number of Children: N/A  . Years of Education: N/A   Social History Main Topics  . Smoking status: Never Smoker   . Smokeless tobacco: Never Used  . Alcohol Use: Yes     Comment: occasional  . Drug Use: No  . Sexual Activity: Not  Asked   Other Topics Concern  . None   Social History Narrative   Allergies  Allergen Reactions  . Clarithromycin   . Tramadol   . Zithromax [Azithromycin]    Family History  Problem Relation Age of Onset  . Diabetes Mother   . Hyperlipidemia Mother   . Hypertension Mother   . Alcohol abuse Father   . Mental illness Father   . Mitral valve prolapse Father   . COPD Father   . Heart failure Father   . Diabetes Brother   . Cancer Paternal Grandmother     bladder    Past medical history, social, surgical and family history all reviewed in electronic medical record.  No pertanent information unless stated regarding to the chief complaint.   Review of Systems: No headache, visual changes, nausea, vomiting, diarrhea, constipation, dizziness, abdominal pain, skin rash, fevers, chills, night sweats, weight loss, swollen lymph nodes, body aches, joint swelling, muscle aches, chest pain, shortness of breath, mood changes.   Objective Blood pressure 122/74, pulse 82, height 5\' 9"  (1.753 m), weight 212 lb (96.163 kg), SpO2 97 %.  General: No apparent distress alert and oriented x3 mood and affect normal, dressed appropriately.  HEENT: Pupils equal, extraocular movements intact  Respiratory: Patient's speak in full sentences and does not appear short of breath  Cardiovascular: No lower extremity edema, non tender, no erythema  Skin: Warm dry intact with no signs of infection or rash on extremities or on axial skeleton.  Abdomen: Soft nontender  Neuro: Cranial nerves II through XII are intact, neurovascularly intact in all extremities with 2+ DTRs and 2+ pulses.  Lymph: No lymphadenopathy of posterior or anterior cervical chain or axillae bilaterally.  Gait normal with good balance and coordination.  MSK:  Non tender with full range of motion and good stability and symmetric strength and tone of shoulders, elbows, wrist, , knee and ankles bilaterally.  Right hip shows the patient is  tender to palpation of the greater trochanteric area. Positive Faber test. Negative pain with internal rotation. No groin pain. Full strength. Negative straight leg test.  MSK US performed of: Right This study was ordered, performed, and interpreted by Charlann Boxer D.O.  Hip: Trochanteric bursa with significant hypoechoic changes and swelling Acetabular labrum visualized and without tears, displacement, or effusion in joint. Femoral neck appears unremarkable without increased power doppler signal along Cortex.  IMPRESSION:  Greater trochanter bursitis   Procedure: Real-time Ultrasound Guided Injection of right greater trochanteric bursitis secondary to patient's body habitus Device: GE Logiq E  Ultrasound guided injection is preferred based studies that show increased duration, increased effect, greater accuracy, decreased procedural pain, increased response rate, and decreased cost with ultrasound guided versus blind injection.  Verbal informed consent obtained.  Time-out conducted.  Noted no overlying erythema, induration, or other signs of local infection.  Skin prepped in a sterile fashion.  Local anesthesia: Topical Ethyl chloride.  With sterile technique and under real time ultrasound guidance:  Greater trochanteric area was visualized and patient's bursa was noted. A 22-gauge 3 inch needle was inserted and 4 cc of 0.5% Marcaine and 1 cc of Kenalog 40 mg/dL was injected. Pictures taken Completed without difficulty  Pain immediately resolved suggesting accurate placement of the medication.  Advised to call if fevers/chills, erythema, induration, drainage, or persistent bleeding.  Images permanently stored and available for review in the ultrasound unit.  Impression: Technically successful ultrasound guided injection.   Procedure note [id number]; 15 minutes spent for Therapeutic exercises as stated in above notes.  This included exercises focusing on stretching, strengthening, with  significant focus on eccentric aspects.  Pelvic tilt/bracing to help with proper recruitment of the lower abs and pelvic floor muscles  Glute strengthening to properly contract glutes without over-engaging low back and hamstrings - prone hip extension and glute bridge exercises Proper stretching techniques to increase effectiveness for the hip flexors, groin, quads, piriformis and low back when appropriate  Proper technique shown and discussed handout in great detail with ATC.  All questions were discussed and answered.     Impression and Recommendations:     This case required medical decision making of moderate complexity.      Note: This dictation was prepared with Dragon dictation along with smaller phrase technology. Any transcriptional errors that result from this process are unintentional.

## 2015-03-30 NOTE — Assessment & Plan Note (Signed)
Patient does have some mild changes on x-ray. I do not think that this is severe enough to give her discomfort. If worsening symptoms at follow-up we will consider looking more at her back and she could be a candidate for osteopathic manipulation.

## 2015-03-30 NOTE — Assessment & Plan Note (Signed)
Patient given injection and tolerated the procedure very well. We discussed icing regimen and home exercises. We discussed which activities to do an which was potentially avoid. Patient given a prescription for topical anti-inflammatories, once weekly vitamin D supplementation secondary to the calcific changes, as well as we discussed proper shoewear. Patient worked with Product/process development scientist to learn home exercises in greater detail. Patient and will come back and see me again in 4 weeks if any worsening pain we will look at patient's degenerative disc disease of the lumbar spine.

## 2015-03-31 ENCOUNTER — Encounter: Payer: Self-pay | Admitting: Family Medicine

## 2015-03-31 MED ORDER — VITAMIN D (ERGOCALCIFEROL) 1.25 MG (50000 UNIT) PO CAPS: 50000.0000 [IU] | ORAL_CAPSULE | ORAL | Status: DC

## 2015-04-16 ENCOUNTER — Telehealth: Payer: Self-pay | Admitting: Family Medicine

## 2015-04-16 NOTE — Telephone Encounter (Signed)
Left patient vm to call back if needing to reschedule appt that was cancelled through the phone system that was scheduled for 4/10

## 2015-04-20 ENCOUNTER — Ambulatory Visit: Payer: 59 | Admitting: Family Medicine

## 2015-04-29 ENCOUNTER — Other Ambulatory Visit: Payer: Self-pay | Admitting: Obstetrics and Gynecology

## 2015-05-13 ENCOUNTER — Encounter: Payer: Self-pay | Admitting: Internal Medicine

## 2015-05-13 MED ORDER — FLUTICASONE PROPIONATE 50 MCG/ACT NA SUSP: NASAL | Status: DC

## 2015-06-01 ENCOUNTER — Other Ambulatory Visit (HOSPITAL_COMMUNITY)
Admission: RE | Admit: 2015-06-01 | Discharge: 2015-06-01 | Disposition: A | Discharge status: Home / Self Care | Payer: 59 | Source: Ambulatory Visit | Attending: Internal Medicine | Admitting: Internal Medicine

## 2015-06-01 ENCOUNTER — Encounter: Payer: Self-pay | Admitting: Internal Medicine

## 2015-06-01 ENCOUNTER — Ambulatory Visit (INDEPENDENT_AMBULATORY_CARE_PROVIDER_SITE_OTHER): Payer: 59 | Admitting: Internal Medicine

## 2015-06-01 VITALS — BP 128/82 | HR 82 | Temp 97.8°F | Resp 12 | Ht 68.25 in | Wt 213.2 lb

## 2015-06-01 DIAGNOSIS — R87811 Vaginal high risk human papillomavirus (HPV) DNA test positive: Secondary | ICD-10-CM

## 2015-06-01 DIAGNOSIS — Z0000 Encounter for general adult medical examination without abnormal findings: Secondary | ICD-10-CM

## 2015-06-01 DIAGNOSIS — R5383 Other fatigue: Secondary | ICD-10-CM | POA: Diagnosis not present

## 2015-06-01 DIAGNOSIS — E041 Nontoxic single thyroid nodule: Secondary | ICD-10-CM

## 2015-06-01 DIAGNOSIS — Z01419 Encounter for gynecological examination (general) (routine) without abnormal findings: Secondary | ICD-10-CM | POA: Insufficient documentation

## 2015-06-01 DIAGNOSIS — Z1151 Encounter for screening for human papillomavirus (HPV): Secondary | ICD-10-CM | POA: Insufficient documentation

## 2015-06-01 DIAGNOSIS — Z124 Encounter for screening for malignant neoplasm of cervix: Secondary | ICD-10-CM

## 2015-06-01 DIAGNOSIS — R7301 Impaired fasting glucose: Secondary | ICD-10-CM | POA: Diagnosis not present

## 2015-06-01 DIAGNOSIS — E559 Vitamin D deficiency, unspecified: Secondary | ICD-10-CM | POA: Diagnosis not present

## 2015-06-01 DIAGNOSIS — E669 Obesity, unspecified: Secondary | ICD-10-CM

## 2015-06-01 DIAGNOSIS — Z1239 Encounter for other screening for malignant neoplasm of breast: Secondary | ICD-10-CM

## 2015-06-01 DIAGNOSIS — R739 Hyperglycemia, unspecified: Secondary | ICD-10-CM

## 2015-06-01 LAB — COMPREHENSIVE METABOLIC PANEL
ALT: 18 U/L (ref 0–35)
AST: 14 U/L (ref 0–37)
Albumin: 3.8 g/dL (ref 3.5–5.2)
Alkaline Phosphatase: 58 U/L (ref 39–117)
BUN: 10 mg/dL (ref 6–23)
CO2: 27 mEq/L (ref 19–32)
Calcium: 8.7 mg/dL (ref 8.4–10.5)
Chloride: 102 mEq/L (ref 96–112)
Creatinine, Ser: 0.64 mg/dL (ref 0.40–1.20)
GFR: 106.38 mL/min (ref >60.00)
Glucose, Bld: 107 mg/dL — ABNORMAL HIGH (ref 70–99)
Potassium: 4.1 mEq/L (ref 3.5–5.1)
Sodium: 136 mEq/L (ref 135–145)
Total Bilirubin: 0.6 mg/dL (ref 0.2–1.2)
Total Protein: 6.7 g/dL (ref 6.0–8.3)

## 2015-06-01 LAB — CBC WITH DIFFERENTIAL/PLATELET
Basophils Absolute: 0.1 10*3/uL (ref 0.0–0.1)
Basophils Relative: 0.7 % (ref 0.0–3.0)
Eosinophils Absolute: 0.3 10*3/uL (ref 0.0–0.7)
Eosinophils Relative: 3.6 % (ref 0.0–5.0)
HCT: 40.7 % (ref 36.0–46.0)
Hemoglobin: 13.4 g/dL (ref 12.0–15.0)
Lymphocytes Relative: 27.6 % (ref 12.0–46.0)
Lymphs Abs: 2.5 10*3/uL (ref 0.7–4.0)
MCHC: 33.1 g/dL (ref 30.0–36.0)
MCV: 85 fl (ref 78.0–100.0)
Monocytes Absolute: 0.7 10*3/uL (ref 0.1–1.0)
Monocytes Relative: 7.4 % (ref 3.0–12.0)
Neutro Abs: 5.6 10*3/uL (ref 1.4–7.7)
Neutrophils Relative %: 60.7 % (ref 43.0–77.0)
Platelets: 314 10*3/uL (ref 150.0–400.0)
RBC: 4.78 Mil/uL (ref 3.87–5.11)
RDW: 14.1 % (ref 11.5–15.5)
WBC: 9.2 10*3/uL (ref 4.0–10.5)

## 2015-06-01 LAB — LIPID PANEL
Cholesterol: 210 mg/dL — ABNORMAL HIGH (ref 0–200)
HDL: 39.6 mg/dL (ref >39.00)
LDL Cholesterol: 140 mg/dL — ABNORMAL HIGH (ref 0–99)
NonHDL: 170.79
Total CHOL/HDL Ratio: 5
Triglycerides: 154 mg/dL — ABNORMAL HIGH (ref 0.0–149.0)
VLDL: 30.8 mg/dL (ref 0.0–40.0)

## 2015-06-01 LAB — VITAMIN D 25 HYDROXY (VIT D DEFICIENCY, FRACTURES): VITD: 45.79 ng/mL (ref 30.00–100.00)

## 2015-06-01 LAB — TSH: TSH: 1.12 u[IU]/mL (ref 0.35–4.50)

## 2015-06-01 LAB — T4, FREE: Free T4: 0.95 ng/dL (ref 0.60–1.60)

## 2015-06-01 LAB — HEMOGLOBIN A1C: Hgb A1c MFr Bld: 6.1 % (ref 4.6–6.5)

## 2015-06-01 MED ORDER — PRAMIPEXOLE DIHYDROCHLORIDE 0.75 MG PO TABS: 0.7500 mg | ORAL_TABLET | Freq: Three times a day (TID) | ORAL | Status: DC

## 2015-06-01 MED ORDER — IBUPROFEN 800 MG PO TABS: 800.0000 mg | ORAL_TABLET | Freq: Three times a day (TID) | ORAL | Status: DC | PRN

## 2015-06-01 MED ORDER — MONTELUKAST SODIUM 10 MG PO TABS: 10.0000 mg | ORAL_TABLET | Freq: Every day | ORAL | Status: DC

## 2015-06-01 MED ORDER — PROGESTERONE MICRONIZED 200 MG PO CAPS: 200.0000 mg | ORAL_CAPSULE | Freq: Every day | ORAL | Status: DC

## 2015-06-01 NOTE — Progress Notes (Signed)
Pre-visit discussion using our clinic review tool. No additional management support is needed unless otherwise documented below in the visit note.  

## 2015-06-01 NOTE — Assessment & Plan Note (Signed)
Annual comprehensive preventive exam was done as well as an evaluation and management of chronic conditions .  During the course of the visit the patient was educated and counseled about appropriate screening and preventive services including :  diabetes screening, lipid analysis with projected  10 year  risk for CAD , nutrition counseling, breast, cervical and colorectal cancer screening, and recommended immunizations.  Printed recommendations for health maintenance screenings was give 

## 2015-06-01 NOTE — Progress Notes (Signed)
Patient ID: Patricia [name], female    DOB: October 26, 1970  Age: 45 y.o. MRN: [id number]  The patient is here for annual physical examination and management of other chronic and acute problems.   positive HPV vaginal cuff. 2016. S/p TAH.  Mammogram overdue    The risk factors are reflected in the social history.  The roster of all physicians providing medical care to patient - is listed in the Snapshot section of the chart.  Home safety : The patient has smoke detectors in the home. They wear seatbelts.  There are no firearms at home. There is no violence in the home.   There is no risks for hepatitis, STDs or HIV. There is no   history of blood transfusion. They have no travel history to infectious disease endemic areas of the world.  The patient has seen their dentist in the last six month. They have seen their eye doctor in the last year.   They do not  have excessive sun exposure. Discussed the need for sun protection: hats, long sleeves and use of sunscreen if there is significant sun exposure.   Diet: the importance of a healthy diet is discussed. They do have a healthy diet.  The benefits of regular aerobic exercise were discussed. 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  The following portions of the patient's history were reviewed and updated as appropriate: allergies, current medications, past family history, past medical history,  past surgical history, past social history  and problem list.  Visual acuity was not assessed per patient preference since she has regular follow up with her ophthalmologist. Hearing and body mass index were assessed and reviewed.   During the course of the visit the patient was educated and counseled about appropriate screening and preventive services including : fall prevention , diabetes  screening, nutrition counseling, colorectal cancer screening, and recommended immunizations.    CC: The primary encounter diagnosis was Breast cancer screening. Diagnoses of Cervical cancer screening, Impaired fasting glucose, Vitamin D deficiency, Thyroid nodule, Other fatigue, Visit for preventive health examination, Vaginal high risk HPV DNA test positive, Hyperglycemia, and Obesity were also pertinent to this visit.  Weight gain 5 llbs since feb noted BMI 32   Had right hip inejction by zach smith in march .  Had 6 weeks of pain relief, starting to recur     History Patricia [name] has a past medical history of Thyroid disease and Premature atrial contractions.   She has past surgical history that includes Abdominal hysterectomy (Dec 2012); Tonsillectomy (May 2012); transthoracic echocardiogram (04/2014); and 48 hour Holter Monitor (05/04/2014).   Her family history includes Alcohol abuse in her father; COPD in her father; Cancer in her paternal grandmother; Diabetes in her brother and mother; Heart failure in her father; Hyperlipidemia in her mother; Hypertension in her mother; Mental illness in her father; Mitral valve prolapse in her father.She reports that she has never smoked. She has never used smokeless tobacco. She reports that she drinks alcohol. She reports that she does not use illicit drugs.  Outpatient Prescriptions Prior to Visit  Medication Sig Dispense Refill  . Ashwagandha Extract 2.5 % POWD 1 capsule by Does not apply route daily.    . Biotin 1000 MCG tablet Take 1 tablet (1 mg total) by mouth 3 (three) times daily. 90 tablet 3  . Cholecalciferol (HM VITAMIN  D3) 2000 UNITS CAPS Take 1 capsule by mouth daily.    . cyclobenzaprine (FLEXERIL) 10 MG tablet Take 1 tablet (10 mg total) by mouth 3 (three) times daily as needed for muscle spasms. 90 tablet 2  . DHEA 25 MG CAPS Take 1 capsule by mouth daily.    . diclofenac (VOLTAREN) 75 MG EC tablet Take 1 tablet (75 mg total) by mouth 2  (two) times daily. 60 tablet 3  . fluticasone (FLONASE) 50 MCG/ACT nasal spray 2 sprays in each nostril once daily 16 g 6  . loratadine (CLARITIN) 10 MG tablet Take 1 tablet (10 mg total) by mouth daily. 90 tablet 2  . Multiple Vitamins-Minerals (MULTIVITAMIN WITH MINERALS) tablet Take 1 tablet by mouth daily. Reported on 03/05/2015    . Nutritional Supplements (5-HTP TRYPTOPHAN) 50 MG TABS Take 1 tablet by mouth daily.    Marland Kitchen RA BEE POLLEN PO Take 1 capsule by mouth daily.    . Vitamin D, Ergocalciferol, (DRISDOL) 50000 units CAPS capsule Take 1 capsule (50,000 Units total) by mouth every 7 (seven) days. 12 capsule 0  . zolpidem (AMBIEN) 10 MG tablet Take 1 tablet (10 mg total) by mouth at bedtime as needed for sleep. 30 tablet 1  . ibuprofen (ADVIL,MOTRIN) 800 MG tablet TAKE 1 TABLET BY MOUTH 3 TIMES A DAY AS NEEDED 50 tablet 1  . pramipexole (MIRAPEX) 0.5 MG tablet Take 1 tablet (0.5 mg total) by mouth daily. 90 tablet 3  . progesterone (PROMETRIUM) 200 MG capsule Take 1 capsule (200 mg total) by mouth daily. 90 capsule 2  . Diclofenac Sodium 2 % SOLN Apply 1 pump twice daily. (Patient not taking: Reported on 06/01/2015) 112 g 3  . montelukast (SINGULAIR) 10 MG tablet Take 1 tablet (10 mg total) by mouth at bedtime. 90 tablet 3   No facility-administered medications prior to visit.    Review of Systems  Objective:  BP 128/82 mmHg  Pulse 82  Temp(Src) 97.8 F (36.6 C) (Oral)  Resp 12  Ht 5' 8.25" (1.734 m)  Wt 213 lb 4 oz (96.73 kg)  BMI 32.17 kg/m2  SpO2 98%  Physical Exam  General Appearance:    Alert, cooperative, no distress, appears stated age  Head:    Normocephalic, without obvious abnormality, atraumatic  Eyes:    PERRL, conjunctiva/corneas clear, EOM's intact, fundi    benign, both eyes  Ears:    Normal TM's and external ear canals, both ears  Nose:   Nares normal, septum midline, mucosa normal, no drainage    or sinus tenderness  Throat:   Lips, mucosa, and tongue  normal; teeth and gums normal  Neck:   Supple, symmetrical, trachea midline, no adenopathy;    thyroid:  no enlargement/tenderness/nodules; no carotid   bruit or JVD  Back:     Symmetric, no curvature, ROM normal, no CVA tenderness  Lungs:     Clear to auscultation bilaterally, respirations unlabored  Chest Wall:    No tenderness or deformity   Heart:    Regular rate and rhythm, S1 and S2 normal, no murmur, rub   or gallop  Breast Exam:    No tenderness, masses, or nipple abnormality  Abdomen:     Soft, non-tender, bowel sounds active all four quadrants,    no masses, no organomegaly  Genitalia:    Pelvic: cervix surgically absent, external genitalia normal, no adnexal masses or tenderness,  rectovaginal septum normal, uterus surgically  absent and vagina normal without discharge  Extremities:   Extremities normal, atraumatic, no cyanosis or edema  Pulses:   2+ and symmetric all extremities  Skin:   Skin color, texture, turgor normal, no rashes or lesions  Lymph nodes:   Cervical, supraclavicular, and axillary nodes normal  Neurologic:   CNII-XII intact, normal strength, sensation and reflexes    throughout     Assessment & Plan:   Problem List Items Addressed This Visit    Visit for preventive health examination    Annual comprehensive preventive exam was done as well as an evaluation and management of chronic conditions .  During the course of the visit the patient was educated and counseled about appropriate screening and preventive services including :  diabetes screening, lipid analysis with projected  10 year  risk for CAD , nutrition counseling, breast, cervical and colorectal cancer screening, and recommended immunizations.  Printed recommendations for health maintenance screenings was give      Obesity    I have addressed  BMI and recommended a low glycemic index diet utilizing smaller more frequent meals to increase metabolism.  I have also recommended that patient start  exercising with a goal of 30 minutes of aerobic exercise a minimum of 5 days per week. Screening for lipid disorders, thyroid and diabetes to be done today.        Hyperglycemia    A1cswill be repeated with fasting labs.  lifestyle and low glycemic index diet advised.     Lab Results  Component Value Date   HGBA1C 5.8 05/12/2014         Vaginal high risk HPV DNA test positive    PAP smear of vaginal cuff repeated today        Other Visit Diagnoses    Breast cancer screening    -  Primary    Relevant Orders    MM DIGITAL SCREENING BILATERAL    Cervical cancer screening        Relevant Orders    Cytology - PAP    Impaired fasting glucose        Relevant Orders    Comprehensive metabolic panel    Lipid panel    Hemoglobin A1c    Vitamin D deficiency        Relevant Orders    VITAMIN D 25 Hydroxy (Vit-D Deficiency, Fractures)    Thyroid nodule        Relevant Orders    TSH    T4, free    US Soft Tissue Head/Neck    Other fatigue        Relevant Orders    CBC with Differential/Platelet       I have discontinued Patricia [name]'s pramipexole. I have also changed her ibuprofen. Additionally, I am having her start on pramipexole. Lastly, I am having her maintain her multivitamin with minerals, loratadine, Biotin, RA BEE POLLEN PO, Ashwagandha Extract, 5-HTP Tryptophan, DHEA, Cholecalciferol, zolpidem, cyclobenzaprine, diclofenac, Diclofenac Sodium, Vitamin D (Ergocalciferol), fluticasone, montelukast, and progesterone.  Meds ordered this encounter  Medications  . pramipexole (MIRAPEX) 0.75 MG tablet    Sig: Take 1 tablet (0.75 mg total) by mouth 3 (three) times daily.    Dispense:  90 tablet    Refill:  2  . montelukast (SINGULAIR) 10 MG tablet    Sig: Take 1 tablet (10 mg total) by mouth at bedtime.    Dispense:  90 tablet    Refill:  3  . ibuprofen (ADVIL,MOTRIN) 800 MG tablet    Sig: Take 1 tablet (  800 mg total) by mouth 3 (three) times daily as needed.     Dispense:  90 tablet    Refill:  3  . progesterone (PROMETRIUM) 200 MG capsule    Sig: Take 1 capsule (200 mg total) by mouth daily.    Dispense:  90 capsule    Refill:  22    Medications Discontinued During This Encounter  Medication Reason  . pramipexole (MIRAPEX) 0.5 MG tablet   . montelukast (SINGULAIR) 10 MG tablet Reorder  . ibuprofen (ADVIL,MOTRIN) 800 MG tablet Reorder  . progesterone (PROMETRIUM) 200 MG capsule Reorder    Follow-up: No Follow-up on file.   [name], MD

## 2015-06-01 NOTE — Assessment & Plan Note (Addendum)
A1cswill be repeated with fasting labs.  lifestyle and low glycemic index diet advised.     Lab Results  Component Value Date   HGBA1C 5.8 05/12/2014

## 2015-06-01 NOTE — Assessment & Plan Note (Signed)
PAP smear of vaginal cuff repeated today

## 2015-06-01 NOTE — Assessment & Plan Note (Signed)
I have addressed  BMI and recommended a low glycemic index diet utilizing smaller more frequent meals to increase metabolism.  I have also recommended that patient start exercising with a goal of 30 minutes of aerobic exercise a minimum of 5 days per week. Screening for lipid disorders, thyroid and diabetes to be done today.   

## 2015-06-01 NOTE — Patient Instructions (Signed)
Your mammogram and thyroid ultrasound have been ordered  Menopause is a normal process in which your reproductive ability comes to an end. This process happens gradually over a span of months to years, usually between the ages of 46 and 75. Menopause is complete when you have missed 12 consecutive menstrual periods. It is important to talk with your health care provider about some of the most common conditions that affect postmenopausal women, such as heart disease, cancer, and bone loss (osteoporosis). Adopting a healthy lifestyle and getting preventive care can help to promote your health and wellness. Those actions can also lower your chances of developing some of these common conditions. WHAT SHOULD I KNOW ABOUT MENOPAUSE? During menopause, you may experience a number of symptoms, such as:  Moderate-to-severe hot flashes.  Night sweats.  Decrease in sex drive.  Mood swings.  Headaches.  Tiredness.  Irritability.  Memory problems.  Insomnia. Choosing to treat or not to treat menopausal changes is an individual decision that you make with your health care provider. WHAT SHOULD I KNOW ABOUT HORMONE REPLACEMENT THERAPY AND SUPPLEMENTS? Hormone therapy products are effective for treating symptoms that are associated with menopause, such as hot flashes and night sweats. Hormone replacement carries certain risks, especially as you become older. If you are thinking about using estrogen or estrogen with progestin treatments, discuss the benefits and risks with your health care provider. WHAT SHOULD I KNOW ABOUT HEART DISEASE AND STROKE? Heart disease, heart attack, and stroke become more likely as you age. This may be due, in part, to the hormonal changes that your body experiences during menopause. These can affect how your body processes dietary fats, triglycerides, and cholesterol. Heart attack and stroke are both medical emergencies. There are many things that you can do to help prevent  heart disease and stroke:  Have your blood pressure checked at least every 1-2 years. High blood pressure causes heart disease and increases the risk of stroke.  If you are 46-31 years old, ask your health care provider if you should take aspirin to prevent a heart attack or a stroke.  Do not use any tobacco products, including cigarettes, chewing tobacco, or electronic cigarettes. If you need help quitting, ask your health care provider.  It is important to eat a healthy diet and maintain a healthy weight.  Be sure to include plenty of vegetables, fruits, low-fat dairy products, and lean protein.  Avoid eating foods that are high in solid fats, added sugars, or salt (sodium).  Get regular exercise. This is one of the most important things that you can do for your health.  Try to exercise for at least 150 minutes each week. The type of exercise that you do should increase your heart rate and make you sweat. This is known as moderate-intensity exercise.  Try to do strengthening exercises at least twice each week. Do these in addition to the moderate-intensity exercise.  Know your numbers.Ask your health care provider to check your cholesterol and your blood glucose. Continue to have your blood tested as directed by your health care provider. WHAT SHOULD I KNOW ABOUT CANCER SCREENING? There are several types of cancer. Take the following steps to reduce your risk and to catch any cancer development as early as possible. Breast Cancer  Practice breast self-awareness.  This means understanding how your breasts normally appear and feel.  It also means doing regular breast self-exams. Let your health care provider know about any changes, no matter how small.  If you  are 46 or older, have a clinician do a breast exam (clinical breast exam or CBE) every year. Depending on your age, family history, and medical history, it may be recommended that you also have a yearly breast X-ray  (mammogram).  If you have a family history of breast cancer, talk with your health care provider about genetic screening.  If you are at high risk for breast cancer, talk with your health care provider about having an MRI and a mammogram every year.  Breast cancer (BRCA) gene test is recommended for women who have family members with BRCA-related cancers. Results of the assessment will determine the need for genetic counseling and BRCA1 and for BRCA2 testing. BRCA-related cancers include these types:  Breast. This occurs in males or females.  Ovarian.  Tubal. This may also be called fallopian tube cancer.  Cancer of the abdominal or pelvic lining (peritoneal cancer).  Prostate.  Pancreatic. Cervical, Uterine, and Ovarian Cancer Your health care provider may recommend that you be screened regularly for cancer of the pelvic organs. These include your ovaries, uterus, and vagina. This screening involves a pelvic exam, which includes checking for microscopic changes to the surface of your cervix (Pap test).  For women ages 21-65, health care providers may recommend a pelvic exam and a Pap test every three years. For women ages 46-65, they may recommend the Pap test and pelvic exam, combined with testing for human papilloma virus (HPV), every five years. Some types of HPV increase your risk of cervical cancer. Testing for HPV may also be done on women of any age who have unclear Pap test results.  Other health care providers may not recommend any screening for nonpregnant women who are considered low risk for pelvic cancer and have no symptoms. Ask your health care provider if a screening pelvic exam is right for you.  If you have had past treatment for cervical cancer or a condition that could lead to cancer, you need Pap tests and screening for cancer for at least 20 years after your treatment. If Pap tests have been discontinued for you, your risk factors (such as having a new sexual partner)  need to be reassessed to determine if you should start having screenings again. Some women have medical problems that increase the chance of getting cervical cancer. In these cases, your health care provider may recommend that you have screening and Pap tests more often.  If you have a family history of uterine cancer or ovarian cancer, talk with your health care provider about genetic screening.  If you have vaginal bleeding after reaching menopause, tell your health care provider.  There are currently no reliable tests available to screen for ovarian cancer. Lung Cancer Lung cancer screening is recommended for adults 39-31 years old who are at high risk for lung cancer because of a history of smoking. A yearly low-dose CT scan of the lungs is recommended if you:  Currently smoke.  Have a history of at least 30 pack-years of smoking and you currently smoke or have quit within the past 15 years. A pack-year is smoking an average of one pack of cigarettes per day for one year. Yearly screening should:  Continue until it has been 15 years since you quit.  Stop if you develop a health problem that would prevent you from having lung cancer treatment. Colorectal Cancer  This type of cancer can be detected and can often be prevented.  Routine colorectal cancer screening usually begins at age 56  and continues through age 93.  If you have risk factors for colon cancer, your health care provider may recommend that you be screened at an earlier age.  If you have a family history of colorectal cancer, talk with your health care provider about genetic screening.  Your health care provider may also recommend using home test kits to check for hidden blood in your stool.  A small camera at the end of a tube can be used to examine your colon directly (sigmoidoscopy or colonoscopy). This is done to check for the earliest forms of colorectal cancer.  Direct examination of the colon should be repeated  every 5-10 years until age 78. However, if early forms of precancerous polyps or small growths are found or if you have a family history or genetic risk for colorectal cancer, you may need to be screened more often. Skin Cancer  Check your skin from head to toe regularly.  Monitor any moles. Be sure to tell your health care provider:  About any new moles or changes in moles, especially if there is a change in a mole's shape or color.  If you have a mole that is larger than the size of a pencil eraser.  If any of your family members has a history of skin cancer, especially at a young age, talk with your health care provider about genetic screening.  Always use sunscreen. Apply sunscreen liberally and repeatedly throughout the day.  Whenever you are outside, protect yourself by wearing long sleeves, pants, a wide-brimmed hat, and sunglasses. WHAT SHOULD I KNOW ABOUT OSTEOPOROSIS? Osteoporosis is a condition in which bone destruction happens more quickly than new bone creation. After menopause, you may be at an increased risk for osteoporosis. To help prevent osteoporosis or the bone fractures that can happen because of osteoporosis, the following is recommended:  If you are 71-25 years old, get at least 1,000 mg of calcium and at least 600 mg of vitamin D per day.  If you are older than age 84 but younger than age 79, get at least 1,200 mg of calcium and at least 600 mg of vitamin D per day.  If you are older than age 40, get at least 1,200 mg of calcium and at least 800 mg of vitamin D per day. Smoking and excessive alcohol intake increase the risk of osteoporosis. Eat foods that are rich in calcium and vitamin D, and do weight-bearing exercises several times each week as directed by your health care provider. WHAT SHOULD I KNOW ABOUT HOW MENOPAUSE AFFECTS [place]? Depression may occur at any age, but it is more common as you become older. Common symptoms of depression  include:  Low or sad mood.  Changes in sleep patterns.  Changes in appetite or eating patterns.  Feeling an overall lack of motivation or enjoyment of activities that you previously enjoyed.  Frequent crying spells. Talk with your health care provider if you think that you are experiencing depression. WHAT SHOULD I KNOW ABOUT IMMUNIZATIONS? It is important that you get and maintain your immunizations. These include:  Tetanus, diphtheria, and pertussis (Tdap) booster vaccine.  Influenza every year before the flu season begins.  Pneumonia vaccine.  Shingles vaccine. Your health care provider may also recommend other immunizations.   This information is not intended to replace advice given to you by your health care provider. Make sure you discuss any questions you have with your health care provider.   Document Released: 02/18/2005 Document Revised: 01/17/2014  Document Reviewed: 08/29/2013 Elsevier Interactive Patient Education [employer].

## 2015-06-02 LAB — CYTOLOGY - PAP

## 2015-06-08 ENCOUNTER — Encounter: Payer: Self-pay | Admitting: Internal Medicine

## 2015-06-15 ENCOUNTER — Ambulatory Visit
Admission: RE | Admit: 2015-06-15 | Discharge: 2015-06-15 | Disposition: A | Discharge status: Home / Self Care | Payer: 59 | Source: Ambulatory Visit | Attending: Internal Medicine | Admitting: Internal Medicine

## 2015-06-15 DIAGNOSIS — E041 Nontoxic single thyroid nodule: Secondary | ICD-10-CM | POA: Diagnosis not present

## 2015-06-17 ENCOUNTER — Encounter: Payer: Self-pay | Admitting: Internal Medicine

## 2015-06-25 ENCOUNTER — Encounter: Payer: Self-pay | Admitting: Internal Medicine

## 2015-06-26 ENCOUNTER — Ambulatory Visit
Admission: RE | Admit: 2015-06-26 | Discharge: 2015-06-26 | Disposition: A | Discharge status: Home / Self Care | Payer: 59 | Source: Ambulatory Visit | Attending: Internal Medicine | Admitting: Internal Medicine

## 2015-06-26 ENCOUNTER — Encounter: Payer: Self-pay | Admitting: Internal Medicine

## 2015-06-26 ENCOUNTER — Other Ambulatory Visit: Payer: Self-pay | Admitting: Internal Medicine

## 2015-06-26 DIAGNOSIS — R928 Other abnormal and inconclusive findings on diagnostic imaging of breast: Secondary | ICD-10-CM | POA: Insufficient documentation

## 2015-06-26 DIAGNOSIS — Z1231 Encounter for screening mammogram for malignant neoplasm of breast: Secondary | ICD-10-CM | POA: Diagnosis not present

## 2015-06-26 DIAGNOSIS — Z1239 Encounter for other screening for malignant neoplasm of breast: Secondary | ICD-10-CM

## 2015-06-28 ENCOUNTER — Encounter: Payer: Self-pay | Admitting: Internal Medicine

## 2015-06-28 ENCOUNTER — Telehealth: Payer: Self-pay | Admitting: Internal Medicine

## 2015-06-29 ENCOUNTER — Other Ambulatory Visit: Payer: Self-pay | Admitting: Internal Medicine

## 2015-06-29 ENCOUNTER — Encounter: Payer: Self-pay | Admitting: Family Medicine

## 2015-06-29 ENCOUNTER — Ambulatory Visit (INDEPENDENT_AMBULATORY_CARE_PROVIDER_SITE_OTHER): Payer: 59 | Admitting: Family Medicine

## 2015-06-29 ENCOUNTER — Other Ambulatory Visit: Payer: Self-pay

## 2015-06-29 VITALS — BP 142/82 | HR 92 | Wt 212.0 lb

## 2015-06-29 DIAGNOSIS — M7061 Trochanteric bursitis, right hip: Secondary | ICD-10-CM | POA: Diagnosis not present

## 2015-06-29 DIAGNOSIS — M9902 Segmental and somatic dysfunction of thoracic region: Secondary | ICD-10-CM

## 2015-06-29 DIAGNOSIS — M5136 Other intervertebral disc degeneration, lumbar region: Secondary | ICD-10-CM | POA: Diagnosis not present

## 2015-06-29 DIAGNOSIS — M9903 Segmental and somatic dysfunction of lumbar region: Secondary | ICD-10-CM | POA: Diagnosis not present

## 2015-06-29 DIAGNOSIS — R928 Other abnormal and inconclusive findings on diagnostic imaging of breast: Secondary | ICD-10-CM

## 2015-06-29 DIAGNOSIS — M9904 Segmental and somatic dysfunction of sacral region: Secondary | ICD-10-CM | POA: Diagnosis not present

## 2015-06-29 DIAGNOSIS — M999 Biomechanical lesion, unspecified: Secondary | ICD-10-CM | POA: Insufficient documentation

## 2015-06-29 MED ORDER — GABAPENTIN 100 MG PO CAPS: 100.0000 mg | ORAL_CAPSULE | Freq: Every day | ORAL | Status: DC

## 2015-06-29 NOTE — Assessment & Plan Note (Signed)
Responded fairly well to osteopathic manipulation. Encourage her to continue to work on core strengthening. Patient continued vitamin D supplementation. We discussed other medications for breakthrough. Patient was given topical anti-inflammatories to try. We discussed the icing regimen. Discussed proper shoes. Follow-up again in 4 weeks.

## 2015-06-29 NOTE — Assessment & Plan Note (Signed)
Decision today to treat with OMT was based on Physical Exam  After verbal consent patient was treated with HVLA, ME, FPR techniques in lumbar, sacral and thoracic areas  Patient tolerated the procedure well with improvement in symptoms  Patient given exercises, stretches and lifestyle modifications  See medications in patient instructions if given  Patient will follow up in 4 weeks'

## 2015-06-29 NOTE — Progress Notes (Signed)
[name] [address] [place], [place] [address] Phone: [phone] Subjective:    CC: right hip pain Follow-up, low back pain follow-up  [id number] Patricia [name] is a 45 y.o. female coming in with complaint of right hip pain. Found to have more of a calcific bursitis of the lateral aspect of the hip. Was given an injection. Was doing well and started having increasing pain. Mild osteophytic changes of the back noted as well. Given home exercises. Has been doing conservative therapy. Patient states Hip pain is considerably better. States about 70-85% better. Patient does not want another injection at this time. Having worsening back pain. Describes it as more of a dull, throbbing aching pain. Does have known osteophytic changes.     Past Medical History  Diagnosis Date  . Thyroid disease     thyroid nodules  . Premature atrial contractions     Echo 04/2014: Normal - EF 55-60%, No RWMA, Normal Vavles & Diastolic Fxn; 48 hr Monitor - Frequent PACs with1 short run of  PAT   Past Surgical History  Procedure Laterality Date  . Abdominal hysterectomy  Dec 2012    secondary to fibroid, heavy bleeding   . Tonsillectomy  May 2012    [facility]  . Transthoracic echocardiogram  04/2014    NORMAL.  EF 55-60%, no Regional WMA, Norml Valves, normal diastolic Fxn, normal RV / RVSP  . 48 hour holter monitor  05/04/2014    5 PVCs, 359 PACs (some with aberrant conduction) 1 short run of 3 beats PAT, no true arrhythmia   Social History   Social History  . Marital Status: Married    Spouse Name: N/A  . Number of Children: N/A  . Years of Education: N/A   Social History Main Topics  . Smoking status: Never Smoker   . Smokeless tobacco: Never Used  . Alcohol Use: Yes     Comment: occasional  . Drug Use: No  . Sexual Activity: Not on file   Other Topics Concern  . Not on file   Social History Narrative   Allergies  Allergen Reactions  .  Clarithromycin   . Tramadol   . Zithromax [Azithromycin]    Family History  Problem Relation Age of Onset  . Diabetes Mother   . Hyperlipidemia Mother   . Hypertension Mother   . Alcohol abuse Father   . Mental illness Father   . Mitral valve prolapse Father   . COPD Father   . Heart failure Father   . Diabetes Brother   . Cancer Paternal Grandmother     bladder    Past medical history, social, surgical and family history all reviewed in electronic medical record.  No pertanent information unless stated regarding to the chief complaint.   Review of Systems: No headache, visual changes, nausea, vomiting, diarrhea, constipation, dizziness, abdominal pain, skin rash, fevers, chills, night sweats, weight loss, swollen lymph nodes, body aches, joint swelling, muscle aches, chest pain, shortness of breath, mood changes.   Objective There were no vitals taken for this visit.  General: No apparent distress alert and oriented x3 mood and affect normal, dressed appropriately.  HEENT: Pupils equal, extraocular movements intact  Respiratory: Patient's speak in full sentences and does not appear short of breath  Cardiovascular: No lower extremity edema, non tender, no erythema  Skin: Warm dry intact with no signs of infection or rash on extremities or on axial skeleton.  Abdomen: Soft nontender  Neuro: Cranial nerves II through XII are intact, neurovascularly intact in all extremities with 2+ DTRs and 2+ pulses.  Lymph: No lymphadenopathy of posterior or anterior cervical chain or axillae bilaterally.  Gait normal with good balance and coordination.  MSK:  Non tender with full range of motion and good stability and symmetric strength and tone of shoulders, elbows, wrist, , knee and ankles bilaterally.  Right hip exam shows no tenderness over the greater trochanter. Today. Continued positive Corky Sox though. Patient has a negative Chandler test. Increased tenderness over the per spinal musculature  of the lumbar spine. Seems to be worse with extension. Lacks last 5 of extension of the lower back. Strength is full and symmetric of the lower extremity.  Osteopathic findings Seen 2 flexed rotated and side bent right T3 extended rotated and side bent right with inhaled third rib L4 flexed rotated and side bent left Sacrum left on left     Impression and Recommendations:     This case required medical decision making of moderate complexity.      Note: This dictation was prepared with Dragon dictation along with smaller phrase technology. Any transcriptional errors that result from this process are unintentional.

## 2015-06-29 NOTE — Assessment & Plan Note (Signed)
Seems to be doing relatively well at this time. Does have calcific changes noted on x-ray previously. We discussed possibly repeating ultrasound at this time to further evaluate. Patient thinks it would not change management. Continuing vitamin D supplementation. Follow-up again in 4 weeks.

## 2015-06-29 NOTE — Patient Instructions (Addendum)
Good to see you  We injected you 3 months ago and lets continue to monitor Ice when you need it.  Keep working on the ergonomics at work  Gabapentin 100mg  at night may help with restless leg and with the back pain and should not make you too sleepy.  New back exercises.  We will need to work  On the core.  See me again in 3-4 weeks.

## 2015-07-13 ENCOUNTER — Ambulatory Visit
Admission: RE | Admit: 2015-07-13 | Discharge: 2015-07-13 | Disposition: A | Discharge status: Home / Self Care | Payer: 59 | Source: Ambulatory Visit | Attending: Internal Medicine | Admitting: Internal Medicine

## 2015-07-13 DIAGNOSIS — R928 Other abnormal and inconclusive findings on diagnostic imaging of breast: Secondary | ICD-10-CM

## 2015-07-13 DIAGNOSIS — N6489 Other specified disorders of breast: Secondary | ICD-10-CM | POA: Insufficient documentation

## 2015-07-13 DIAGNOSIS — R922 Inconclusive mammogram: Secondary | ICD-10-CM | POA: Diagnosis not present

## 2015-07-15 ENCOUNTER — Encounter: Payer: Self-pay | Admitting: Internal Medicine

## 2015-07-27 ENCOUNTER — Encounter: Payer: Self-pay | Admitting: Family Medicine

## 2015-07-27 ENCOUNTER — Ambulatory Visit (INDEPENDENT_AMBULATORY_CARE_PROVIDER_SITE_OTHER): Payer: 59 | Admitting: Family Medicine

## 2015-07-27 VITALS — BP 136/80 | HR 84 | Wt 211.0 lb

## 2015-07-27 DIAGNOSIS — M9903 Segmental and somatic dysfunction of lumbar region: Secondary | ICD-10-CM | POA: Diagnosis not present

## 2015-07-27 DIAGNOSIS — M999 Biomechanical lesion, unspecified: Secondary | ICD-10-CM

## 2015-07-27 DIAGNOSIS — M5136 Other intervertebral disc degeneration, lumbar region: Secondary | ICD-10-CM

## 2015-07-27 DIAGNOSIS — M9902 Segmental and somatic dysfunction of thoracic region: Secondary | ICD-10-CM | POA: Diagnosis not present

## 2015-07-27 DIAGNOSIS — M9904 Segmental and somatic dysfunction of sacral region: Secondary | ICD-10-CM

## 2015-07-27 MED ORDER — VITAMIN D (ERGOCALCIFEROL) 1.25 MG (50000 UNIT) PO CAPS: 50000.0000 [IU] | ORAL_CAPSULE | ORAL | Status: DC

## 2015-07-27 MED ORDER — NORTRIPTYLINE HCL 10 MG PO CAPS: 10.0000 mg | ORAL_CAPSULE | Freq: Every day | ORAL | Status: DC

## 2015-07-27 NOTE — Assessment & Plan Note (Signed)
Patient does have some very mild arthritis of the back. We discussed with patient again at great length. Lamina continued to do conservative therapy. Change patient's gabapentin to nortriptyline to see if this will be beneficial. Encourage her to keep doing the over-the-counter medications. We discussed proper dosing of the other vitamin supplementations. Encourage patient to do more stretching after work or anytime she is in a flexed position for a long amount of time with the hips. Patient and will follow-up with me again in 4-6 weeks for further evaluation and treatment.

## 2015-07-27 NOTE — Assessment & Plan Note (Signed)
Decision today to treat with OMT was based on Physical Exam  After verbal consent patient was treated with HVLA, ME, FPR techniques in lumbar, sacral and thoracic areas  Patient tolerated the procedure well with improvement in symptoms  Patient given exercises, stretches and lifestyle modifications  See medications in patient instructions if given  Patient will follow up in 4-6 weeks

## 2015-07-27 NOTE — Progress Notes (Signed)
[name] [name], [place] [address] Phone: [phone] Subjective:    CC: right hip pain Follow-up, low back pain follow-up  [id number] Patricia [name] is a 45 y.o. female coming in with complaint of right hip pain. Found to have more of a calcific bursitis of the lateral aspect of the hip. Was given an injection. Was doing well And is pain free on the lateral aspect of the hip. Patient does have some mild arthritic changes of the back. 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 Denies any fevers, chills, any abnormal weight loss. Patient states throughout the day if she keeps moving she seems to do relatively well. Denies any radiation down the leg. Seems to stay localized. Seems a significant tightness in the morning but when she gets moving seems to do better. Attempted to gabapentin with no significant improvement. Did respond well to osteopathic manipulation but only last approximately 1 week. Has seen a chiropractor and acupuncture previously.    Past Medical History  Diagnosis Date  . Thyroid disease     thyroid nodules  . Premature atrial contractions     Echo 04/2014: Normal - EF 55-60%, No RWMA, Normal Vavles & Diastolic Fxn; 48 hr Monitor - Frequent PACs with1 short run of  PAT   Past Surgical History  Procedure Laterality Date  . Abdominal hysterectomy  Dec 2012    secondary to fibroid, heavy bleeding   . Tonsillectomy  May 2012    [facility]  . Transthoracic echocardiogram  04/2014    NORMAL.  EF 55-60%, no Regional WMA, Norml Valves, normal diastolic Fxn, normal RV / RVSP  . 48 hour holter monitor  05/04/2014    5 PVCs, 359 PACs (some with aberrant conduction) 1 short run of 3 beats PAT, no true arrhythmia  . Breast lumpectomy Right 1991 or 1992    fibroadenoma   Social History   Social History  . Marital Status: Married    Spouse Name: N/A  . Number of Children: N/A  . Years of Education: N/A   Social History Main Topics  . Smoking status: Never Smoker   . Smokeless tobacco: Never Used  . Alcohol Use: Yes     Comment: occasional  . Drug Use: No  . Sexual Activity: Not Asked   Other Topics Concern  . None   Social History Narrative   Allergies  Allergen Reactions  . Clarithromycin   . Tramadol   . Zithromax [Azithromycin]    Family History  Problem Relation Age of Onset  . Diabetes Mother   . Hyperlipidemia Mother   . Hypertension Mother   . Alcohol abuse Father   . Mental illness Father   . Mitral valve prolapse Father   . COPD Father   . Heart failure Father   . Diabetes Brother   . Cancer Paternal Grandmother     bladder  . Breast cancer Paternal Grandmother     Past medical history, social, surgical and family history all reviewed in electronic medical record.  No pertanent information unless stated regarding to the chief complaint.   Review of Systems: No headache, visual changes, nausea, vomiting, diarrhea, constipation, dizziness, abdominal pain, skin rash, fevers, chills, night sweats, weight loss, swollen lymph nodes, body aches, joint swelling, muscle aches, chest pain, shortness of breath, mood changes.   Objective Blood pressure  136/80, pulse 84, weight 211 lb (95.709 kg).  General: No apparent distress alert and oriented x3 mood and affect normal, dressed appropriately.  HEENT: Pupils equal, extraocular movements intact  Respiratory: Patient's speak in full sentences and does not appear short of breath  Cardiovascular: No lower extremity edema, non tender, no erythema  Skin: Warm dry intact with no signs of infection or rash on extremities or on axial skeleton.  Abdomen: Soft nontender  Neuro: Cranial nerves II through XII are intact, neurovascularly intact in all extremities with 2+ DTRs and 2+ pulses.  Lymph: No lymphadenopathy of posterior or anterior cervical chain  or axillae bilaterally.  Gait normal with good balance and coordination.  MSK:  Non tender with full range of motion and good stability and symmetric strength and tone of shoulders, elbows, wrist, , knee and ankles bilaterally.  Right hip exam shows no tenderness over the greater trochanter. Today. Continued positive Corky Sox though. Mild increased tenderness of the hip flexors bilaterally right greater left. Seems worse than previous exam.  Osteopathic findings C2 flexed rotated and side bent right T3 extended rotated and side bent right with inhaled third rib L2 flexed rotated and side bent right L4 flexed rotated and side bent left Sacrum left on left     Impression and Recommendations:     This case required medical decision making of moderate complexity.      Note: This dictation was prepared with Dragon dictation along with smaller phrase technology. Any transcriptional errors that result from this process are unintentional.

## 2015-07-27 NOTE — Patient Instructions (Signed)
Good to see you  Ice still can help Try to do the hip flexor stretch right after work, before bed and even after exercise.  An inversion table may help Stop the gabapentin  Sart nortriptyline 1 pill at night, if no side effects can go up to 2 pills 500mg  of vitamin C See me again in 4 weeks.

## 2015-08-23 NOTE — Assessment & Plan Note (Addendum)
Patient recommended to continue conservative therapy.  Patient given core strengthening exercises that I think with beneficial. We discussed icing regimen. We discussed hip abductors and ergonomics throughout the day. Patient will come back I believe there should be some mild psychosomatic aspect of this as well. Started on Effexor.

## 2015-08-23 NOTE — Assessment & Plan Note (Addendum)
Decision today to treat with OMT was based on Physical Exam  After verbal consent patient was treated with HVLA, ME, FPR techniques in lumbar, sacral and thoracic areas  Patient tolerated the procedure well with improvement in symptoms  Patient given exercises, stretches and lifestyle modifications  See medications in patient instructions if given  Patient will follow up in 4 weeks

## 2015-08-23 NOTE — Progress Notes (Signed)
[name] [address] [place], [place] [address] Phone: [phone] Subjective:    CC: right hip pain Follow-up, low back pain follow-up  [id number]  Patricia [name] is a 45 y.o. female coming in with complaint of right hip pain. Found to have more of a calcific bursitis of the lateral aspect of the hip. Was given an injection. Was doing well And is pain free on the lateral aspect of the hip. Patient does have some mild arthritic changes of the back.  Does do well overall.  Given exercises last time, ice recommended. Patient states Unfortunate she continues to have discomfort. Patient states that when she works as seems to be worse. When she tries to do the exercises also seems to worse than night. Not usually during activity it usually the afterwards. Has had some increasing stress in her life as well.    Past Medical History:  Diagnosis Date  . Premature atrial contractions    Echo 04/2014: Normal - EF 55-60%, No RWMA, Normal Vavles & Diastolic Fxn; 48 hr Monitor - Frequent PACs with1 short run of  PAT  . Thyroid disease    thyroid nodules   Past Surgical History:  Procedure Laterality Date  . 48 hour Holter Monitor  05/04/2014   5 PVCs, 359 PACs (some with aberrant conduction) 1 short run of 3 beats PAT, no true arrhythmia  . ABDOMINAL HYSTERECTOMY  Dec 2012   secondary to fibroid, heavy bleeding   . BREAST LUMPECTOMY Right 1991 or 1992   fibroadenoma  . TONSILLECTOMY  May 2012   [name]  . TRANSTHORACIC ECHOCARDIOGRAM  04/2014   NORMAL.  EF 55-60%, no Regional WMA, Norml Valves, normal diastolic Fxn, normal RV / RVSP   Social History   Social History  . Marital status: Married    Spouse name: N/A  . Number of children: N/A  . Years of education: N/A   Social History Main Topics  . Smoking status: Never Smoker  . Smokeless tobacco: Never Used  . Alcohol use Yes     Comment: occasional  . Drug use: No  . Sexual activity:  Not Asked   Other Topics Concern  . None   Social History Narrative  . None   Allergies  Allergen Reactions  . Clarithromycin   . Tramadol   . Zithromax [Azithromycin]    Family History  Problem Relation Age of Onset  . Diabetes Mother   . Hyperlipidemia Mother   . Hypertension Mother   . Alcohol abuse Father   . Mental illness Father   . Mitral valve prolapse Father   . COPD Father   . Heart failure Father   . Diabetes Brother   . Cancer Paternal Grandmother     bladder  . Breast cancer Paternal Grandmother     Past medical history, social, surgical and family history all reviewed in electronic medical record.  No pertanent information unless stated regarding to the chief complaint.   Review of Systems: No headache, visual changes, nausea, vomiting, diarrhea, constipation, dizziness, abdominal pain, skin rash, fevers, chills, night sweats, weight loss, swollen lymph nodes, body aches, joint swelling, muscle aches, chest pain, shortness of breath, mood changes.   Objective  Blood pressure 130/84, pulse 86, weight 218 lb (98.9 kg), SpO2 96 %.  General: No apparent distress alert and oriented x3 mood and affect normal, dressed appropriately.  HEENT: Pupils equal, extraocular movements intact  Respiratory: Patient's speak in full sentences and  does not appear short of breath  Cardiovascular: No lower extremity edema, non tender, no erythema  Skin: Warm dry intact with no signs of infection or rash on extremities or on axial skeleton.  Abdomen: Soft nontender  Neuro: Cranial nerves II through XII are intact, neurovascularly intact in all extremities with 2+ DTRs and 2+ pulses.  Lymph: No lymphadenopathy of posterior or anterior cervical chain or axillae bilaterally.  Gait normal with good balance and coordination.  MSK:  Non tender with full range of motion and good stability and symmetric strength and tone of shoulders, elbows, wrist, , knee and ankles bilaterally.  Less  pain over the hip today. Full range of motion. Minimal tenderness over the greater trochanteric area still noted..  Osteopathic findings C2 flexed rotated and side bent right T3 extended rotated and side bent right with inhaled third rib L2 flexed rotated and side bent right L4 flexed rotated and side bent left Sacrum left on left Same pattern as previously.     Impression and Recommendations:     This case required medical decision making of moderate complexity.      Note: This dictation was prepared with Dragon dictation along with smaller phrase technology. Any transcriptional errors that result from this process are unintentional.

## 2015-08-24 ENCOUNTER — Encounter: Payer: Self-pay | Admitting: Family Medicine

## 2015-08-24 ENCOUNTER — Ambulatory Visit (INDEPENDENT_AMBULATORY_CARE_PROVIDER_SITE_OTHER): Payer: 59 | Admitting: Family Medicine

## 2015-08-24 VITALS — BP 130/84 | HR 86 | Wt 218.0 lb

## 2015-08-24 DIAGNOSIS — M5136 Other intervertebral disc degeneration, lumbar region: Secondary | ICD-10-CM

## 2015-08-24 DIAGNOSIS — E669 Obesity, unspecified: Secondary | ICD-10-CM

## 2015-08-24 DIAGNOSIS — M9902 Segmental and somatic dysfunction of thoracic region: Secondary | ICD-10-CM

## 2015-08-24 DIAGNOSIS — F321 Major depressive disorder, single episode, moderate: Secondary | ICD-10-CM

## 2015-08-24 DIAGNOSIS — M999 Biomechanical lesion, unspecified: Secondary | ICD-10-CM

## 2015-08-24 DIAGNOSIS — M9904 Segmental and somatic dysfunction of sacral region: Secondary | ICD-10-CM

## 2015-08-24 DIAGNOSIS — M9903 Segmental and somatic dysfunction of lumbar region: Secondary | ICD-10-CM | POA: Diagnosis not present

## 2015-08-24 MED ORDER — VENLAFAXINE HCL ER 37.5 MG PO CP24: 37.5000 mg | ORAL_CAPSULE | Freq: Every day | ORAL | 1 refills | Status: DC

## 2015-08-24 NOTE — Assessment & Plan Note (Signed)
Encourage weight loss. 

## 2015-08-24 NOTE — Assessment & Plan Note (Signed)
History of depression. We discussed different treatment options and patient has elected to start a low dose of Effexor. Think that this will be a better possible side effects painful for her. Do think it could be beneficial. We discussed icing regimen. Continue her other medications. Follow-up again in 4 weeks.

## 2015-08-24 NOTE — Patient Instructions (Addendum)
Great to see you  Keep it up.  You have made some progress.  I know it is frustrating.  Effexor 37.5 mg daily We will discuss going up at follow up Posture is key and it will not be easy  See me again in 4 weeks.

## 2015-09-08 ENCOUNTER — Encounter: Payer: Self-pay | Admitting: Internal Medicine

## 2015-09-20 NOTE — Assessment & Plan Note (Signed)
Has been doing relatively well. Has been on a dose of Effexor consider increasing to 75 mg. We discussed icing regimen, home exercises in which activities doing which was potentially avoid. Patient will come back and see me again in 4-6 weeks.

## 2015-09-20 NOTE — Assessment & Plan Note (Signed)
Encourage weight loss. 

## 2015-09-20 NOTE — Assessment & Plan Note (Signed)
Decision today to treat with OMT was based on Physical Exam  After verbal consent patient was treated with HVLA, ME, FPR techniques in lumbar, sacral and thoracic areas  Patient tolerated the procedure well with improvement in symptoms  Patient given exercises, stretches and lifestyle modifications  See medications in patient instructions if given  Patient will follow up in 4-6 weeks

## 2015-09-20 NOTE — Progress Notes (Signed)
[name] [address] [place], [place] [address] Phone: [phone] Subjective:    CC: right hip pain Follow-up, low back pain follow-up  [id number]  Patricia [name] is a 45 y.o. female coming in with complaint of right hip pain. Found to have more of a calcific bursitis that resolved with injection. Patient also has degenerative disc disease of the lumbar spine doesn't seem to be responding fairly well to osteopathic manipulation. Patient states She has been somewhat stress recently. Has had difficulty with her house, recently diarrhea engaged, as well as working on custody for her nephew. Patient has not had time to do the exercises regularly. Felt that the Effexor has helped her back though. States that she was doing much better until the last 3 days. Started having increasing tightness and tenderness. Sleeping on a poor mattress she states..   Past Medical History:  Diagnosis Date  . Premature atrial contractions    Echo 04/2014: Normal - EF 55-60%, No RWMA, Normal Vavles & Diastolic Fxn; 48 hr Monitor - Frequent PACs with1 short run of  PAT  . Thyroid disease    thyroid nodules   Past Surgical History:  Procedure Laterality Date  . 48 hour Holter Monitor  05/04/2014   5 PVCs, 359 PACs (some with aberrant conduction) 1 short run of 3 beats PAT, no true arrhythmia  . ABDOMINAL HYSTERECTOMY  Dec 2012   secondary to fibroid, heavy bleeding   . BREAST LUMPECTOMY Right 1991 or 1992   fibroadenoma  . TONSILLECTOMY  May 2012   [name]  . TRANSTHORACIC ECHOCARDIOGRAM  04/2014   NORMAL.  EF 55-60%, no Regional WMA, Norml Valves, normal diastolic Fxn, normal RV / RVSP   Social History   Social History  . Marital status: Married    Spouse name: N/A  . Number of children: N/A  . Years of education: N/A   Social History Main Topics  . Smoking status: Never Smoker  . Smokeless tobacco: Never Used  . Alcohol use Yes     Comment: occasional    . Drug use: No  . Sexual activity: Not Asked   Other Topics Concern  . None   Social History Narrative  . None   Allergies  Allergen Reactions  . Clarithromycin   . Tramadol   . Zithromax [Azithromycin]    Family History  Problem Relation Age of Onset  . Diabetes Mother   . Hyperlipidemia Mother   . Hypertension Mother   . Alcohol abuse Father   . Mental illness Father   . Mitral valve prolapse Father   . COPD Father   . Heart failure Father   . Diabetes Brother   . Cancer Paternal Grandmother     bladder  . Breast cancer Paternal Grandmother     Past medical history, social, surgical and family history all reviewed in electronic medical record.  No pertanent information unless stated regarding to the chief complaint.   Review of Systems: No headache, visual changes, nausea, vomiting, diarrhea, constipation, dizziness, abdominal pain, skin rash, fevers, chills, night sweats, weight loss, swollen lymph nodes, body aches, joint swelling, muscle aches, chest pain, shortness of breath, mood changes.   Objective  Blood pressure 126/80, pulse 68, weight 212 lb (96.2 kg), SpO2 97 %.  General: No apparent distress alert and oriented x3 mood and affect normal, dressed appropriately.  HEENT: Pupils equal, extraocular movements intact  Respiratory: Patient's speak in full sentences and does not appear  short of breath  Cardiovascular: No lower extremity edema, non tender, no erythema  Skin: Warm dry intact with no signs of infection or rash on extremities or on axial skeleton.  Abdomen: Soft nontender  Neuro: Cranial nerves II through XII are intact, neurovascularly intact in all extremities with 2+ DTRs and 2+ pulses.  Lymph: No lymphadenopathy of posterior or anterior cervical chain or axillae bilaterally.  Gait normal with good balance and coordination.  MSK:  Non tender with full range of motion and good stability and symmetric strength and tone of shoulders, elbows, wrist, ,  knee and ankles bilaterally. Tenderness over the paraspinal musculature of the lumbar spine. Negative straight leg test. Mild tight hamstrings bilaterally..  Osteopathic findings C2 flexed rotated and side bent right T3 extended rotated and side bent right with inhaled third rib L2 flexed rotated and side bent right L5 flexed rotated and side bent left Sacrum left on left Same pattern as previously.     Impression and Recommendations:     This case required medical decision making of moderate complexity.      Note: This dictation was prepared with Dragon dictation along with smaller phrase technology. Any transcriptional errors that result from this process are unintentional.

## 2015-09-21 ENCOUNTER — Encounter: Payer: Self-pay | Admitting: Internal Medicine

## 2015-09-21 ENCOUNTER — Encounter: Payer: Self-pay | Admitting: Family Medicine

## 2015-09-21 ENCOUNTER — Ambulatory Visit (INDEPENDENT_AMBULATORY_CARE_PROVIDER_SITE_OTHER): Payer: 59 | Admitting: Family Medicine

## 2015-09-21 VITALS — BP 126/80 | HR 68 | Wt 212.0 lb

## 2015-09-21 DIAGNOSIS — M9904 Segmental and somatic dysfunction of sacral region: Secondary | ICD-10-CM

## 2015-09-21 DIAGNOSIS — M9903 Segmental and somatic dysfunction of lumbar region: Secondary | ICD-10-CM

## 2015-09-21 DIAGNOSIS — M9902 Segmental and somatic dysfunction of thoracic region: Secondary | ICD-10-CM | POA: Diagnosis not present

## 2015-09-21 DIAGNOSIS — M5136 Other intervertebral disc degeneration, lumbar region: Secondary | ICD-10-CM

## 2015-09-21 DIAGNOSIS — E669 Obesity, unspecified: Secondary | ICD-10-CM

## 2015-09-21 DIAGNOSIS — M999 Biomechanical lesion, unspecified: Secondary | ICD-10-CM

## 2015-09-21 NOTE — Patient Instructions (Signed)
Good to see you I am so sorry you are having so much going on.  Ice is your friend Make sure you find time for yourself.  Continue the effexor at the same dose and then we will consider increasing if we need See me again in 4 weeks.

## 2015-09-21 NOTE — Telephone Encounter (Signed)
Last time BP medication was filled was 2013 ok to fill? Not on current medication list.

## 2015-09-22 ENCOUNTER — Other Ambulatory Visit: Payer: Self-pay | Admitting: Internal Medicine

## 2015-09-22 MED ORDER — LOSARTAN POTASSIUM-HCTZ 100-12.5 MG PO TABS: 1.0000 | ORAL_TABLET | Freq: Every day | ORAL | 3 refills | Status: DC

## 2015-09-30 ENCOUNTER — Encounter: Payer: Self-pay | Admitting: Internal Medicine

## 2015-10-01 ENCOUNTER — Ambulatory Visit
Admission: RE | Admit: 2015-10-01 | Discharge: 2015-10-01 | Disposition: A | Discharge status: Home / Self Care | Payer: 59 | Source: Ambulatory Visit | Attending: Physician Assistant | Admitting: Physician Assistant

## 2015-10-01 ENCOUNTER — Ambulatory Visit: Payer: Self-pay | Admitting: Physician Assistant

## 2015-10-01 ENCOUNTER — Encounter: Payer: Self-pay | Admitting: Physician Assistant

## 2015-10-01 DIAGNOSIS — R05 Cough: Secondary | ICD-10-CM | POA: Diagnosis not present

## 2015-10-01 DIAGNOSIS — Z77120 Contact with and (suspected) exposure to mold (toxic): Secondary | ICD-10-CM | POA: Insufficient documentation

## 2015-10-01 DIAGNOSIS — R059 Cough, unspecified: Secondary | ICD-10-CM

## 2015-10-01 MED ORDER — OMEPRAZOLE 20 MG PO CPDR: 20.0000 mg | DELAYED_RELEASE_CAPSULE | Freq: Every day | ORAL | 3 refills | Status: DC

## 2015-10-01 MED ORDER — FLUTICASONE-SALMETEROL 115-21 MCG/ACT IN AERO: 2.0000 | INHALATION_SPRAY | Freq: Two times a day (BID) | RESPIRATORY_TRACT | 12 refills | Status: DC

## 2015-10-01 MED ORDER — IPRATROPIUM-ALBUTEROL 0.5-2.5 (3) MG/3ML IN SOLN: 3.0000 mL | Freq: Four times a day (QID) | RESPIRATORY_TRACT | Status: DC

## 2015-10-01 NOTE — Progress Notes (Signed)
Spoke with Patricia [name] @ [facility] appt. Scheduled for 10/05/2015 @ 9:30 in the [place] area. Patient has been notifeid

## 2015-10-01 NOTE — Progress Notes (Signed)
Patient was contacted and informed of CXR is ok

## 2015-10-01 NOTE — Progress Notes (Signed)
S: cough for over 6 weeks, states her house has so much mold it has been condemned, cough started after leaky hvac over a year ago, noticed her floors were buckling and mold was growing up the walls, tested and had some toxic mold, has been out of the house for 3 weeks but continues with cough, has been having rashes and low grade temps on and off throughout the year, ?if its related to the mold, cough is productive with white mucus, nonsmoker  O: vitals wnl, nad, lungs with anterior wheezing, cv rrr, svn duoneb given in clinic  A: mold exposure, cough, bronchitis  P: advair hfa, omeprazole, cxr ordered, refer to pulmonology for eval

## 2015-10-05 ENCOUNTER — Ambulatory Visit (INDEPENDENT_AMBULATORY_CARE_PROVIDER_SITE_OTHER): Payer: 59 | Admitting: Pulmonary Disease

## 2015-10-05 ENCOUNTER — Encounter: Payer: Self-pay | Admitting: Pulmonary Disease

## 2015-10-05 VITALS — BP 124/68 | HR 75 | Ht 69.0 in | Wt 210.8 lb

## 2015-10-05 DIAGNOSIS — R05 Cough: Secondary | ICD-10-CM | POA: Diagnosis not present

## 2015-10-05 DIAGNOSIS — Z23 Encounter for immunization: Secondary | ICD-10-CM

## 2015-10-05 DIAGNOSIS — T7840XA Allergy, unspecified, initial encounter: Secondary | ICD-10-CM | POA: Diagnosis not present

## 2015-10-05 DIAGNOSIS — R0600 Dyspnea, unspecified: Secondary | ICD-10-CM | POA: Diagnosis not present

## 2015-10-05 DIAGNOSIS — R059 Cough, unspecified: Secondary | ICD-10-CM

## 2015-10-05 MED ORDER — ALBUTEROL SULFATE HFA 108 (90 BASE) MCG/ACT IN AERS: 2.0000 | INHALATION_SPRAY | Freq: Four times a day (QID) | RESPIRATORY_TRACT | 6 refills | Status: DC | PRN

## 2015-10-05 MED ORDER — MOMETASONE FURO-FORMOTEROL FUM 100-5 MCG/ACT IN AERO: 2.0000 | INHALATION_SPRAY | Freq: Two times a day (BID) | RESPIRATORY_TRACT | 0 refills | Status: DC

## 2015-10-05 MED ORDER — MOMETASONE FURO-FORMOTEROL FUM 100-5 MCG/ACT IN AERO: 2.0000 | INHALATION_SPRAY | Freq: Two times a day (BID) | RESPIRATORY_TRACT | 10 refills | Status: DC

## 2015-10-05 NOTE — Progress Notes (Signed)
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 She also reports intermittent low grade fever, myalgias, intermittent rash and intermittent chest tightness. Her cough is worse in a supine position. Her house is undergoing renovations and has been found to have extensive molds but she has been out of the house for most of the past few weeks. She saw a provider 09/21 who performed CXR (normal) and prescribed Advair and omeprazole. Pt was reportedly wheezing during that encounter. Pt was already on montelukast and loratadine for perennial allergies. Her cough is not any better after the addition of these medications  Past Medical History:  Diagnosis Date  . Premature atrial contractions    Echo 04/2014: Normal - EF 55-60%, No RWMA, Normal Vavles & Diastolic Fxn; 48 hr Monitor - Frequent PACs with1 short run of  PAT  . Thyroid disease    thyroid nodules    Past Surgical History:  Procedure Laterality Date  . 48 hour Holter Monitor  05/04/2014   5 PVCs, 359 PACs (some with aberrant conduction) 1 short run of 3 beats PAT, no true arrhythmia  . ABDOMINAL HYSTERECTOMY  Dec 2012   secondary to fibroid, heavy bleeding   . BREAST LUMPECTOMY Right 1991 or 1992   fibroadenoma  . TONSILLECTOMY  May 2012   [name]  . TRANSTHORACIC ECHOCARDIOGRAM  04/2014   NORMAL.  EF 55-60%, no Regional WMA, Norml Valves, normal diastolic Fxn, normal RV / RVSP  Repeated ear infections as a child   MEDICATIONS: I have reviewed all medications and confirmed regimen as documented  Social History   Social History  . Marital status: Married    Spouse name: N/A  . Number of  children: N/A  . Years of education: N/A   Occupational History  . Not on file.   Social History Main Topics  . Smoking status: Never Smoker  . Smokeless tobacco: Never Used  . Alcohol use Yes     Comment: occasional  . Drug use: No  . Sexual activity: Not on file   Other Topics Concern  . Not on file   Social History Narrative  . No narrative on file    Family History  Problem Relation Age of Onset  . Diabetes Mother   . Hyperlipidemia Mother   . Hypertension Mother   . Alcohol abuse Father   . Mental illness Father   . Mitral valve prolapse Father   . COPD Father   . Heart failure Father   . Diabetes Brother   . Cancer Paternal Grandmother     bladder  . Breast cancer Paternal Grandmother     ROS: No fever, myalgias/arthralgias, unexplained weight loss or weight gain No new focal weakness or sensory deficits No otalgia, hearing loss, visual changes, nasal and sinus symptoms, mouth and throat problems No neck pain or adenopathy No abdominal pain, N/V/D, diarrhea, change in bowel pattern No dysuria, change in urinary pattern   Vitals:   10/05/15 0934  BP: 124/68  Pulse: 75  SpO2: 97%  Weight: 210 lb 12.8 oz (95.6 kg)  Height: 5\' 9"  (1.753 m)     EXAM:  Gen: WDWN, No overt respiratory distress HEENT: NCAT,  sclera white, oropharynx normal, scarring of B TMs  Neck: Supple without LAN, thyromegaly, JVD Lungs: breath sounds: full, percussion: normal, No adventitious sounds Cardiovascular: RRR, no murmurs noted Abdomen: Soft, nontender, normal BS Ext: without clubbing, cyanosis, edema Neuro: CNs grossly intact, motor and sensory intact Skin: Limited exam, no lesions noted  DATA:   BMP Latest Ref Rng & Units 06/01/2015 05/12/2014 03/17/2014  Glucose 70 - 99 mg/dL 107(H) 108(H) 98  BUN 6 - 23 mg/dL 10 8 15   Creatinine 0.40 - 1.20 mg/dL 0.64 0.69 0.85  Sodium 135 - 145 mEq/L 136 135 135  Potassium 3.5 - 5.1 mEq/L 4.1 3.9 3.7  Chloride 96 - 112 mEq/L 102  103 102  CO2 19 - 32 mEq/L 27 26 25   Calcium 8.4 - 10.5 mg/dL 8.7 8.4 8.8    CBC Latest Ref Rng & Units 06/01/2015 05/12/2014 03/17/2014  WBC 4.0 - 10.5 K/uL 9.2 8.6 10.4  Hemoglobin 12.0 - 15.0 g/dL 13.4 13.6 13.4  Hematocrit 36.0 - 46.0 % 40.7 40.9 39.7  Platelets 150.0 - 400.0 K/uL 314.0 269.0 278.0    CXR 10/01/15:  normal  IMPRESSION:   Chronic cough Atopy - seasonal allergies, h/o eczema Possible asthma  It might take several weeks to discern a benefit from the new additions to her medical regimen  PLAN:  Spirometry today - no evidence of obstruction Continue Claritin, Singulair, Flonase Continue omeprazole Change Advair to [facility] - samples provided Use albuterol as needed and before exposures (walking/running trails) Flu vaccine administered Follow up in 4 weeks.    Merton Border, MD PCCM service Mobile [phone] Pager [phone] 10/05/2015

## 2015-10-05 NOTE — Patient Instructions (Addendum)
Spirometry today Continue Claritin, Singulair, Flonase,  Change Advair to [facility] - samples provided Use albuterol as needed and before exposures (trails) Follow up in 4 weeks

## 2015-10-18 NOTE — Progress Notes (Signed)
[name] [address], [place] [address] Phone: [phone] Subjective:    CC: right hip pain Follow-up, low back pain follow-up  [id number]  Patricia [name] is a 45 y.o. female coming in with complaint of right hip pain. Found to have more of a calcific bursitis that resolved with injection. Patient also has degenerative disc disease of the lumbar spine doesn't seem to be responding fairly well to osteopathic manipulation. Patient was having some worsening symptoms and we started our on Effexor. Patient states only took it for a week felt it made her lack emotion. No other side effect, was helping back.  Worsening in. Snoring tightness. Didn't do well for 3 weeks.  Past Medical History:  Diagnosis Date  . Premature atrial contractions    Echo 04/2014: Normal - EF 55-60%, No RWMA, Normal Vavles & Diastolic Fxn; 48 hr Monitor - Frequent PACs with1 short run of  PAT  . Thyroid disease    thyroid nodules   Past Surgical History:  Procedure Laterality Date  . 48 hour Holter Monitor  05/04/2014   5 PVCs, 359 PACs (some with aberrant conduction) 1 short run of 3 beats PAT, no true arrhythmia  . ABDOMINAL HYSTERECTOMY  Dec 2012   secondary to fibroid, heavy bleeding   . BREAST LUMPECTOMY Right 1991 or 1992   fibroadenoma  . TONSILLECTOMY  May 2012   [name]  . TRANSTHORACIC ECHOCARDIOGRAM  04/2014   NORMAL.  EF 55-60%, no Regional WMA, Norml Valves, normal diastolic Fxn, normal RV / RVSP   Social History   Social History  . Marital status: Married    Spouse name: N/A  . Number of children: N/A  . Years of education: N/A   Social History Main Topics  . Smoking status: Never Smoker  . Smokeless tobacco: Never Used  . Alcohol use Yes     Comment: occasional  . Drug use: No  . Sexual activity: Not Asked   Other Topics Concern  . None   Social History Narrative  . None   Allergies  Allergen Reactions  . Clarithromycin     . Tramadol   . Zithromax [Azithromycin]    Family History  Problem Relation Age of Onset  . Diabetes Mother   . Hyperlipidemia Mother   . Hypertension Mother   . Alcohol abuse Father   . Mental illness Father   . Mitral valve prolapse Father   . COPD Father   . Heart failure Father   . Diabetes Brother   . Cancer Paternal Grandmother     bladder  . Breast cancer Paternal Grandmother     Past medical history, social, surgical and family history all reviewed in electronic medical record.  No pertanent information unless stated regarding to the chief complaint.   Review of Systems: No headache, visual changes, nausea, vomiting, diarrhea, constipation, dizziness, abdominal pain, skin rash, fevers, chills, night sweats, weight loss, swollen lymph nodes, , chest pain, shortness of breath. Objective  Blood pressure 110/80, pulse 73, weight 214 lb (97.1 kg), SpO2 97 %.  General: No apparent distress alert and oriented x3 mood and affect normal, dressed appropriately.  HEENT: Pupils equal, extraocular movements intact  Respiratory: Patient's speak in full sentences and does not appear short of breath  Cardiovascular: No lower extremity edema, non tender, no erythema  Skin: Warm dry intact with no signs of infection or rash on extremities or on axial skeleton.  Abdomen: Soft nontender  Neuro: Cranial nerves II through XII are intact, neurovascularly intact in all extremities with 2+ DTRs and 2+ pulses.  Lymph: No lymphadenopathy of posterior or anterior cervical chain or axillae bilaterally.  Gait normal with good balance and coordination.  MSK:  Non tender with full range of motion and good stability and symmetric strength and tone of shoulders, elbows, wrist, , knee and ankles bilaterally. Negative straight leg test. Continued tenderness in the paraspinal musculature of the lumbar spine. Negative straight leg test bilaterally. Still has some tightness of the hip flexors. No spinous  process tenderness. Full strength of the legs that are symmetric  Osteopathic findings C2 flexed rotated and side bent right T3 extended rotated and side bent right with inhaled third rib T7 extended rotated and side bent right  L2 flexed rotated and side bent right L5 flexed rotated and side bent left Sacrum left on left      Impression and Recommendations:     This case required medical decision making of moderate complexity.      Note: This dictation was prepared with Dragon dictation along with smaller phrase technology. Any transcriptional errors that result from this process are unintentional.

## 2015-10-19 ENCOUNTER — Encounter: Payer: Self-pay | Admitting: Family Medicine

## 2015-10-19 ENCOUNTER — Ambulatory Visit (INDEPENDENT_AMBULATORY_CARE_PROVIDER_SITE_OTHER): Payer: 59 | Admitting: Family Medicine

## 2015-10-19 VITALS — BP 110/80 | HR 73 | Wt 214.0 lb

## 2015-10-19 DIAGNOSIS — M999 Biomechanical lesion, unspecified: Secondary | ICD-10-CM | POA: Diagnosis not present

## 2015-10-19 DIAGNOSIS — M5136 Other intervertebral disc degeneration, lumbar region: Secondary | ICD-10-CM

## 2015-10-19 NOTE — Assessment & Plan Note (Signed)
Decision today to treat with OMT was based on Physical Exam  After verbal consent patient was treated with HVLA, ME, FPR techniques in lumbar, sacral and thoracic areas  Patient tolerated the procedure well with improvement in symptoms  Patient given exercises, stretches and lifestyle modifications  See medications in patient instructions if given  Patient will follow up in 5-6 weeks

## 2015-10-19 NOTE — Patient Instructions (Addendum)
Good to see you  Keep it up  I am glad things are coming into some order See me again in 5 weeks.

## 2015-10-19 NOTE — Assessment & Plan Note (Addendum)
Patient will likely have some underlying difficult. We discussed and will, throbbing aching sensation. No radiation of pain down the legs. No numbness or weakness. We will continue to monitor closely. We discussed icing regimen and home exercises. We discussed which activities doing which was potentially avoid. Patient will come back and see me again in 5-6 weeks.

## 2015-10-26 ENCOUNTER — Encounter: Payer: Self-pay | Admitting: Pulmonary Disease

## 2015-10-27 ENCOUNTER — Telehealth: Payer: Self-pay | Admitting: Pulmonary Disease

## 2015-10-27 NOTE — Telephone Encounter (Signed)
Patricia [name] from [employer] [facility] (patient manger calling ) Asking if we can send a copy of patient flu shot record to them Pt is aware of this.  Please fax to  [phone]

## 2015-10-27 NOTE — Telephone Encounter (Signed)
Flu vaccine record faxed. Nothing further needed.

## 2015-11-16 ENCOUNTER — Ambulatory Visit: Payer: Self-pay | Admitting: Pulmonary Disease

## 2015-11-22 NOTE — Progress Notes (Signed)
[name] [address] [place], [place] [address] Phone: [phone] Subjective:    CC: right hip pain Follow-up, low back pain follow-up  [id number]  Patricia [name] is a 45 y.o. female coming in with complaint of right hip pain. Found to have more of a calcific bursitis that resolved with injection. Patient also has degenerative disc disease of the lumbar spine doesn't seem to be responding fairly well to osteopathic manipulation. Patient was having some worsening symptoms and we started our on Effexor. Patient had difficulty at first but then started again. States that it has felt some of the pain. Not having as severe pain is what she was having previously. Still having some tightness. No significant radiation down the legs. Patient states though  She is now having a new problem. Starting have bilateral hand pain. States that certain activities seem to make it worse. Patient states that there is some numbness in her fingers but seems to be all her fingers. Denies any injury. Denies any weakness. States that when she wakes up in the morning he can be very difficult.   Past Medical History:  Diagnosis Date  . Premature atrial contractions    Echo 04/2014: Normal - EF 55-60%, No RWMA, Normal Vavles & Diastolic Fxn; 48 hr Monitor - Frequent PACs with1 short run of  PAT  . Thyroid disease    thyroid nodules   Past Surgical History:  Procedure Laterality Date  . 48 hour Holter Monitor  05/04/2014   5 PVCs, 359 PACs (some with aberrant conduction) 1 short run of 3 beats PAT, no true arrhythmia  . ABDOMINAL HYSTERECTOMY  Dec 2012   secondary to fibroid, heavy bleeding   . BREAST LUMPECTOMY Right 1991 or 1992   fibroadenoma  . TONSILLECTOMY  May 2012   [name]  . TRANSTHORACIC ECHOCARDIOGRAM  04/2014   NORMAL.  EF 55-60%, no Regional WMA, Norml Valves, normal diastolic Fxn, normal RV / RVSP   Social History   Social History  . Marital status:  Married    Spouse name: N/A  . Number of children: N/A  . Years of education: N/A   Social History Main Topics  . Smoking status: Never Smoker  . Smokeless tobacco: Never Used  . Alcohol use Yes     Comment: occasional  . Drug use: No  . Sexual activity: Not Asked   Other Topics Concern  . None   Social History Narrative  . None   Allergies  Allergen Reactions  . Clarithromycin   . Tramadol   . Zithromax [Azithromycin]    Family History  Problem Relation Age of Onset  . Diabetes Mother   . Hyperlipidemia Mother   . Hypertension Mother   . Alcohol abuse Father   . Mental illness Father   . Mitral valve prolapse Father   . COPD Father   . Heart failure Father   . Diabetes Brother   . Cancer Paternal Grandmother     bladder  . Breast cancer Paternal Grandmother     Past medical history, social, surgical and family history all reviewed in electronic medical record.  No pertanent information unless stated regarding to the chief complaint.   Review of Systems: No headache, visual changes, nausea, vomiting, diarrhea, constipation, dizziness, abdominal pain, skin rash, fevers, chills, night sweats, weight loss, swollen lymph nodes, , chest pain, shortness of breath. Objective  Blood pressure 124/78, pulse 86, height 5\' 9"  (1.753 m), weight 218 lb (  98.9 kg), SpO2 97 %.  Systems examined below as of 11/23/15 General: NAD A&O x3 mood, affect normal  HEENT: Pupils equal, extraocular movements intact no nystagmus Respiratory: not short of breath at rest or with speaking Cardiovascular: No lower extremity edema, non tender Skin: Warm dry intact with no signs of infection or rash on extremities or on axial skeleton. Abdomen: Soft nontender, no masses Neuro: Cranial nerves  intact, neurovascularly intact in all extremities with 2+ DTRs and 2+ pulses. Lymph: No lymphadenopathy appreciated today  Gait normal with good balance and coordination.  MSK: Non tender with full range of  motion and good stability and symmetric strength and tone of shoulders, elbows, wrist,  knee hips and ankles bilaterally.  Back exam shows some significant tightness of the low spine. Patient does have a negative straight leg test. Tightness of the [place] on the right side which is new. Still 4 out of 5 strength of hip abductors otherwise strength is symmetric bilaterally. Neurovascular intact distally.  Neck: Inspection unremarkable. No palpable stepoffs. Negative Spurling's maneuver. Full neck range of motion Grip strength and sensation normal in bilateral hands Strength good C4 to T1 distribution No sensory change to C4 to T1 Negative Hoffman sign bilaterally Reflexes normal   Osteopathic findings C2 flexed rotated and side bent right T3 extended rotated and side bent right with inhaled third rib T5 extended rotated and side bent right  L2 flexed rotated and side bent right L4 flexed rotated and side bent left Sacrum left on left      Impression and Recommendations:     This case required medical decision making of moderate complexity.      Note: This dictation was prepared with Dragon dictation along with smaller phrase technology. Any transcriptional errors that result from this process are unintentional.

## 2015-11-23 ENCOUNTER — Encounter: Payer: Self-pay | Admitting: Family Medicine

## 2015-11-23 ENCOUNTER — Ambulatory Visit (INDEPENDENT_AMBULATORY_CARE_PROVIDER_SITE_OTHER): Payer: 59 | Admitting: Family Medicine

## 2015-11-23 VITALS — BP 124/78 | HR 86 | Ht 69.0 in | Wt 218.0 lb

## 2015-11-23 DIAGNOSIS — M5136 Other intervertebral disc degeneration, lumbar region: Secondary | ICD-10-CM

## 2015-11-23 DIAGNOSIS — R200 Anesthesia of skin: Secondary | ICD-10-CM | POA: Diagnosis not present

## 2015-11-23 DIAGNOSIS — M5410 Radiculopathy, site unspecified: Secondary | ICD-10-CM | POA: Insufficient documentation

## 2015-11-23 DIAGNOSIS — M999 Biomechanical lesion, unspecified: Secondary | ICD-10-CM | POA: Diagnosis not present

## 2015-11-23 DIAGNOSIS — M5412 Radiculopathy, cervical region: Secondary | ICD-10-CM

## 2015-11-23 DIAGNOSIS — M5416 Radiculopathy, lumbar region: Secondary | ICD-10-CM | POA: Insufficient documentation

## 2015-11-23 NOTE — Assessment & Plan Note (Signed)
Patient does have degenerative disc disease. Discussed with patient again at great length. Patient wants to avoid advance imaging at this moment. Patient will continue with conservative therapy. If any radicular symptoms occur any weakness patient knows to seek medical attention immediately. Muscle relaxer given again. We discussed avoiding certain activities. Patient will continue to stay active. Follow-up again in 3-6 weeks.

## 2015-11-23 NOTE — Assessment & Plan Note (Signed)
Decision today to treat with OMT was based on Physical Exam  After verbal consent patient was treated with HVLA, ME, FPR techniques in lumbar, sacral and thoracic areas  Patient tolerated the procedure well with improvement in symptoms  Patient given exercises, stretches and lifestyle modifications  See medications in patient instructions if given  Patient will follow up in 3-6 weeks

## 2015-11-23 NOTE — Patient Instructions (Signed)
Good to see you  Bilateral EMG of the arms to see what is going on Continue the effexor.  Add B12 1024mcg dailly  Just tight, keep stretching overall.  See me again in 4-6 weeks.

## 2015-11-23 NOTE — Assessment & Plan Note (Signed)
Patient is having some numbness in the hands bilaterally. Patient states that it seems to be all the fingers which goes against any specific level. I do think that some of this could be psychosomatic in nature. Differential does though include cervical radiculopathy, bilateral carpal Tunnel with patient doing repetitive motion, or as well as any type of median and radial nerve impingement. Patient will have an EMG done to try to give [place] more progress.

## 2015-11-24 ENCOUNTER — Other Ambulatory Visit: Payer: Self-pay | Admitting: *Deleted

## 2015-11-24 ENCOUNTER — Other Ambulatory Visit: Payer: Self-pay | Admitting: Internal Medicine

## 2015-11-24 ENCOUNTER — Other Ambulatory Visit: Payer: Self-pay | Admitting: Family Medicine

## 2015-11-24 DIAGNOSIS — R200 Anesthesia of skin: Secondary | ICD-10-CM

## 2015-11-24 NOTE — Progress Notes (Unsigned)
emgh

## 2015-11-24 NOTE — Telephone Encounter (Signed)
Refill done.  

## 2015-11-25 ENCOUNTER — Other Ambulatory Visit: Payer: Self-pay | Admitting: Internal Medicine

## 2015-11-27 NOTE — Telephone Encounter (Signed)
Rx faxed

## 2015-12-10 ENCOUNTER — Encounter: Payer: Self-pay | Admitting: Neurology

## 2015-12-15 ENCOUNTER — Ambulatory Visit (INDEPENDENT_AMBULATORY_CARE_PROVIDER_SITE_OTHER): Payer: 59 | Admitting: Neurology

## 2015-12-15 ENCOUNTER — Encounter: Payer: Self-pay | Admitting: Family Medicine

## 2015-12-15 DIAGNOSIS — G5603 Carpal tunnel syndrome, bilateral upper limbs: Secondary | ICD-10-CM

## 2015-12-15 DIAGNOSIS — R200 Anesthesia of skin: Secondary | ICD-10-CM

## 2015-12-15 NOTE — Procedures (Signed)
[facility]  [place]  [place], [place] [address] Tel: [phone] Fax:  [phone] Test Date:  12/15/2015  Patient: Patricia [name] DOB: Sep 24, 1970 Physician: Narda Amber, DO  Sex: Female Height: 5\' 9"  Ref Phys: Hulan Saas, D.O.  ID#: [id number] Temp: 34.3C Technician: Jerilynn Mages. Dean   Patient Complaints: This is a 45 year old female referred for evaluation of bilateral hand numbness, weakness and pain.  NCV & EMG Findings: Extensive electrodiagnostic testing of the right upper extremity and additional studies of the left shows:  1. Bilateral median sensory responses show prolonged latency (R4.2, L3.8 ms) and normal amplitude. Bilateral ulnar and radial sensory responses are within normal limits. 2. Left median motor response shows prolonged latency (4.4 ms). Bilateral ulnar and right median motor responses are within normal limits. 3. There is no evidence of active or chronic motor axon loss changes affecting any of the tested muscles. Motor unit configuration and recruitment pattern is within normal limits.  Impression: 1. Left median neuropathy at or distal to the wrist, consistent with the clinical diagnosis of carpal tunnel syndrome; moderate in degree electrically. 2. Right median neuropathy at or distal to the wrist, consistent with the clinical diagnosis of carpal tunnel syndrome; mild in degree electrically. 3. There is no evidence of a cervical radiculopathy affecting the upper extremities.    ___________________________ Narda Amber, DO    Nerve Conduction Studies Anti Sensory Summary Table   Stim Site NR Peak (ms) Norm Peak (ms) P-T Amp (V) Norm P-T Amp  Left Median Anti Sensory (2nd Digit)  34.3C  Wrist    4.2 <3.4 32.7 >20  Right Median Anti Sensory (2nd Digit)  34.3C  Wrist    3.8 <3.4 27.2 >20  Left Radial Anti Sensory (Base 1st Digit)  34.3C  Wrist    1.8 <2.7 25.1 >18  Right Radial Anti Sensory (Base 1st Digit)  34.3C    Wrist    1.6 <2.7 28.6 >18  Left Ulnar Anti Sensory (5th Digit)  34.3C  Wrist    2.9 <3.1 35.5 >12  Right Ulnar Anti Sensory (5th Digit)  34.3C  Wrist    2.8 <3.1 35.3 >12   Motor Summary Table   Stim Site NR Onset (ms) Norm Onset (ms) O-P Amp (mV) Norm O-P Amp Site1 Site2 Delta-0 (ms) Dist (cm) Vel (m/s) Norm Vel (m/s)  Left Median Motor (Abd Poll Brev)  34.3C  Wrist    4.4 <3.9 6.7 >6 Elbow Wrist 4.1 24.0 59 >50  Elbow    8.5  6.6         Right Median Motor (Abd Poll Brev)  34.3C  Wrist    3.5 <3.9 6.9 >6 Elbow Wrist 4.5 24.0 53 >50  Elbow    8.0  6.6         Left Ulnar Motor (Abd Dig Minimi)  34.3C  Wrist    2.9 <3.1 10.9 >7 B Elbow Wrist 3.6 21.0 58 >50  B Elbow    6.5  9.9  A Elbow B Elbow 1.5 10.0 67 >50  A Elbow    8.0  9.9         Right Ulnar Motor (Abd Dig Minimi)  34.3C  Wrist    2.9 <3.1 12.5 >7 B Elbow Wrist 3.5 22.0 63 >50  B Elbow    6.4  11.9  A Elbow B Elbow 1.6 10.0 63 >50  A Elbow    8.0  11.8  EMG   Side Muscle Ins Act Fibs Psw Fasc Number Recrt Dur Dur. Amp Amp. Poly Poly. Comment  Right 1stDorInt Nml Nml Nml Nml Nml Nml Nml Nml Nml Nml Nml Nml N/A  Right Abd Poll Brev Nml Nml Nml Nml Nml Nml Nml Nml Nml Nml Nml Nml N/A  Right Ext Indicis Nml Nml Nml Nml Nml Nml Nml Nml Nml Nml Nml Nml N/A  Right PronatorTeres Nml Nml Nml Nml Nml Nml Nml Nml Nml Nml Nml Nml N/A  Right Biceps Nml Nml Nml Nml Nml Nml Nml Nml Nml Nml Nml Nml N/A  Right Triceps Nml Nml Nml Nml Nml Nml Nml Nml Nml Nml Nml Nml N/A  Right Deltoid Nml Nml Nml Nml Nml Nml Nml Nml Nml Nml Nml Nml N/A  Left 1stDorInt Nml Nml Nml Nml Nml Nml Nml Nml Nml Nml Nml Nml N/A  Left Abd Poll Brev Nml Nml Nml Nml Nml Nml Nml Nml Nml Nml Nml Nml N/A  Left Ext Indicis Nml Nml Nml Nml Nml Nml Nml Nml Nml Nml Nml Nml N/A  Left PronatorTeres Nml Nml Nml Nml Nml Nml Nml Nml Nml Nml Nml Nml N/A  Left Biceps Nml Nml Nml Nml Nml Nml Nml Nml Nml Nml Nml Nml N/A  Left Triceps Nml Nml Nml Nml Nml Nml Nml Nml  Nml Nml Nml Nml N/A  Left Deltoid Nml Nml Nml Nml Nml Nml Nml Nml Nml Nml Nml Nml N/A      Waveforms:

## 2015-12-21 ENCOUNTER — Other Ambulatory Visit: Payer: Self-pay | Admitting: Internal Medicine

## 2015-12-21 DIAGNOSIS — N6489 Other specified disorders of breast: Secondary | ICD-10-CM

## 2015-12-26 NOTE — Progress Notes (Signed)
[name] [name], [place] [address] Phone: [phone] Subjective:    CC:, low back pain follow-up, bilateral hand pain follow-up  [id number]  Patricia [name] is a 45 y.o. female coming in with complaint of right hip pain. Found to have more of a calcific bursitis that resolved with injection.  Patient also has moderate to severe degenerative disc disease of the lumbar spine. Has responded fairly well to osteopathic manipulation in the past. Patient states Overall she seems to be stable. No worsening symptoms. States that she was having some mild neck pain that seemed to be out of the ordinary but nothing severe. Nothing that stops her from activity.  Patient at last exam was having more bilateral hand numbness.atient was sent for an EMG to further evaluate. EMG was independently visualized by me showing bilateral carpal tunnel syndrome left greater than right. Patient states continues to have some trouble. Is now having constant numbness on the left side. Sometimes affects her job.  Past Medical History:  Diagnosis Date  . Premature atrial contractions    Echo 04/2014: Normal - EF 55-60%, No RWMA, Normal Vavles & Diastolic Fxn; 48 hr Monitor - Frequent PACs with1 short run of  PAT  . Thyroid disease    thyroid nodules   Past Surgical History:  Procedure Laterality Date  . 48 hour Holter Monitor  05/04/2014   5 PVCs, 359 PACs (some with aberrant conduction) 1 short run of 3 beats PAT, no true arrhythmia  . ABDOMINAL HYSTERECTOMY  Dec 2012   secondary to fibroid, heavy bleeding   . BREAST LUMPECTOMY Right 1991 or 1992   fibroadenoma  . TONSILLECTOMY  May 2012   [name]  . TRANSTHORACIC ECHOCARDIOGRAM  04/2014   NORMAL.  EF 55-60%, no Regional WMA, Norml Valves, normal diastolic Fxn, normal RV / RVSP   Social History   Social History  . Marital status: Married    Spouse name: N/A  . Number of children: N/A  . Years of  education: N/A   Social History Main Topics  . Smoking status: Never Smoker  . Smokeless tobacco: Never Used  . Alcohol use Yes     Comment: occasional  . Drug use: No  . Sexual activity: Not Asked   Other Topics Concern  . None   Social History Narrative  . None   Allergies  Allergen Reactions  . Clarithromycin   . Tramadol   . Zithromax [Azithromycin]    Family History  Problem Relation Age of Onset  . Diabetes Mother   . Hyperlipidemia Mother   . Hypertension Mother   . Alcohol abuse Father   . Mental illness Father   . Mitral valve prolapse Father   . COPD Father   . Heart failure Father   . Diabetes Brother   . Cancer Paternal Grandmother     bladder  . Breast cancer Paternal Grandmother     Past medical history, social, surgical and family history all reviewed in electronic medical record.  No pertanent information unless stated regarding to the chief complaint.   Review of Systems: No headache, visual changes, nausea, vomiting, diarrhea, constipation, dizziness, abdominal pain, skin rash, fevers, chills, night sweats, weight loss, swollen lymph nodes, body aches, joint swelling, muscle aches, chest pain, shortness of breath, mood changes.    Objective  Blood pressure 122/80, pulse 77, height 5\' 9"  (1.753 m), weight 222 lb (100.7 kg), SpO2 99 %.  Systems examined below  as of 12/28/15 General: NAD A&O x3 mood, affect normal  HEENT: Pupils equal, extraocular movements intact no nystagmus Respiratory: not short of breath at rest or with speaking Cardiovascular: No lower extremity edema, non tender Skin: Warm dry intact with no signs of infection or rash on extremities or on axial skeleton. Abdomen: Soft nontender, no masses Neuro: Cranial nerves  intact, neurovascularly intact in all extremities with 2+ DTRs and 2+ pulses. Lymph: No lymphadenopathy appreciated today  Gait normal with good balance and coordination.  MSK: Non tender with full range of motion  and good stability and symmetric strength and tone of shoulders, elbows,   knee hips and ankles bilaterally.   Wrist: Bilateral  Inspection normal with no visible erythema or swelling. ROM smooth and normal with good flexion and extension and ulnar/radial deviation that is symmetrical with opposite wrist. Palpation is normal over metacarpals, navicular, lunate, and TFCC; tendons without tenderness/ swelling No snuffbox tenderness. No tenderness over Canal of [place]. Strength 5/5 in all directions without pain. Negative Finkelstein, positive tinel's and phalens. Left greater than right Negative Watson's test.   Back exam shows some significant tightness of the low spine. Patient does have a negative straight leg test. Continue positive Corky Sox test on the right. Patient 4+5 strength of the hip abductors. Otherwise strength symmetric everywhere else.   Neck: Inspection unremarkable. No palpable stepoffs. Negative Spurling's maneuver. Tightness on the right side Grip strength and sensation normal in bilateral hands Strength good C4 to T1 distribution No sensory change to C4 to T1 Negative Hoffman sign bilaterally Reflexes normal   Osteopathic findings C2 flexed rotated and side bent right T3 extended rotated and side bent right with inhaled third rib T5 extended rotated and side bent right  L4 flexed rotated and side bent left Sacrum left on left  Procedure: Real-time Ultrasound Guided Injection of right carpal tunnel Device: GE Logiq E  Ultrasound guided injection is preferred based studies that show increased duration, increased effect, greater accuracy, decreased procedural pain, increased response rate with ultrasound guided versus blind injection.  Verbal informed consent obtained.  Time-out conducted.  Noted no overlying erythema, induration, or other signs of local infection.  Skin prepped in a sterile fashion.  Local anesthesia: Topical Ethyl chloride.  With sterile  technique and under real time ultrasound guidance:  median nerve visualized.  23g 5/8 inch needle inserted distal to proximal approach into nerve sheath. Pictures taken nfor needle placement. Patient did have injection of 2 cc of 1% lidocaine, 1 cc of 0.5% Marcaine, and 1 cc of Kenalog 40 mg/dL. Completed without difficulty  Pain immediately resolved suggesting accurate placement of the medication.  Advised to call if fevers/chills, erythema, induration, drainage, or persistent bleeding.  Images permanently stored and available for review in the ultrasound unit.  Impression: Technically successful ultrasound guided injection.   Procedure: Real-time Ultrasound Guided Injection of left carpal tunnel Device: GE Logiq E  Ultrasound guided injection is preferred based studies that show increased duration, increased effect, greater accuracy, decreased procedural pain, increased response rate with ultrasound guided versus blind injection.  Verbal informed consent obtained.  Time-out conducted.  Noted no overlying erythema, induration, or other signs of local infection.  Skin prepped in a sterile fashion.  Local anesthesia: Topical Ethyl chloride.  With sterile technique and under real time ultrasound guidance:  median nerve visualized.  23g 5/8 inch needle inserted distal to proximal approach into nerve sheath. Pictures taken nfor needle placement. Patient did have injection of 2 cc  of 1% lidocaine, 1 cc of 0.5% Marcaine, and 1 cc of Kenalog 40 mg/dL. Completed without difficulty  Pain immediately resolved suggesting accurate placement of the medication.  Advised to call if fevers/chills, erythema, induration, drainage, or persistent bleeding.  Images permanently stored and available for review in the ultrasound unit.  Impression: Technically successful ultrasound guided injection.     Impression and Recommendations:     This case required medical decision making of moderate complexity.       Note: This dictation was prepared with Dragon dictation along with smaller phrase technology. Any transcriptional errors that result from this process are unintentional.

## 2015-12-28 ENCOUNTER — Encounter: Payer: Self-pay | Admitting: Family Medicine

## 2015-12-28 ENCOUNTER — Ambulatory Visit (INDEPENDENT_AMBULATORY_CARE_PROVIDER_SITE_OTHER): Payer: 59 | Admitting: Family Medicine

## 2015-12-28 ENCOUNTER — Ambulatory Visit: Payer: Self-pay

## 2015-12-28 VITALS — BP 122/80 | HR 77 | Ht 69.0 in | Wt 222.0 lb

## 2015-12-28 DIAGNOSIS — M25531 Pain in right wrist: Secondary | ICD-10-CM

## 2015-12-28 DIAGNOSIS — M999 Biomechanical lesion, unspecified: Secondary | ICD-10-CM | POA: Diagnosis not present

## 2015-12-28 DIAGNOSIS — M5136 Other intervertebral disc degeneration, lumbar region: Secondary | ICD-10-CM

## 2015-12-28 DIAGNOSIS — M51369 Other intervertebral disc degeneration, lumbar region without mention of lumbar back pain or lower extremity pain: Secondary | ICD-10-CM

## 2015-12-28 DIAGNOSIS — G5600 Carpal tunnel syndrome, unspecified upper limb: Secondary | ICD-10-CM | POA: Insufficient documentation

## 2015-12-28 DIAGNOSIS — G5603 Carpal tunnel syndrome, bilateral upper limbs: Secondary | ICD-10-CM

## 2015-12-28 MED ORDER — IBUPROFEN-FAMOTIDINE 800-26.6 MG PO TABS: 1.0000 | ORAL_TABLET | Freq: Three times a day (TID) | ORAL | 3 refills | Status: DC | PRN

## 2015-12-28 NOTE — Assessment & Plan Note (Signed)
Decision today to treat with OMT was based on Physical Exam  After verbal consent patient was treated with HVLA, ME, FPR techniques in lumbar, sacral and thoracic areas  Patient tolerated the procedure well with improvement in symptoms  Patient given exercises, stretches and lifestyle modifications  See medications in patient instructions if given  Patient will follow up in 3-6 weeks

## 2015-12-28 NOTE — Assessment & Plan Note (Signed)
Stable at the moment. Discussed core strengthening. Patient will monitor this after the holidays. Patient does respond well to last about manipulation. No change in this regimen and follow-up again in 4 weeks.

## 2015-12-28 NOTE — Assessment & Plan Note (Signed)
Bilateral injections given today. Tolerated the procedure well. We discussed icing regimen and home exercises. We discussed which activities to do in which ones to potentially avoid. Patient will continue to be active. Patient come back and see me again in 3-4 weeks.

## 2015-12-28 NOTE — Patient Instructions (Addendum)
Great to see you  Happy holidays!  I am glad it is not your neck. I am hoping the injections do help Stay active.  Exercises 3 times a week.  The back seems stable overall.  See me again in 3-4 weeks.

## 2016-01-06 ENCOUNTER — Encounter: Payer: Self-pay | Admitting: Internal Medicine

## 2016-01-06 DIAGNOSIS — R739 Hyperglycemia, unspecified: Secondary | ICD-10-CM

## 2016-01-06 DIAGNOSIS — E079 Disorder of thyroid, unspecified: Secondary | ICD-10-CM

## 2016-01-06 DIAGNOSIS — I10 Essential (primary) hypertension: Secondary | ICD-10-CM

## 2016-01-06 DIAGNOSIS — E7849 Other hyperlipidemia: Secondary | ICD-10-CM

## 2016-01-07 ENCOUNTER — Other Ambulatory Visit: Payer: Self-pay | Admitting: Internal Medicine

## 2016-01-07 DIAGNOSIS — Z808 Family history of malignant neoplasm of other organs or systems: Secondary | ICD-10-CM | POA: Diagnosis not present

## 2016-01-07 DIAGNOSIS — Z8052 Family history of malignant neoplasm of bladder: Secondary | ICD-10-CM | POA: Diagnosis not present

## 2016-01-07 DIAGNOSIS — Z803 Family history of malignant neoplasm of breast: Secondary | ICD-10-CM | POA: Diagnosis not present

## 2016-01-07 DIAGNOSIS — I10 Essential (primary) hypertension: Secondary | ICD-10-CM | POA: Diagnosis not present

## 2016-01-08 LAB — LIPID PANEL WITH LDL/HDL RATIO
Cholesterol, Total: 241 mg/dL — ABNORMAL HIGH (ref 100–199)
HDL: 44 mg/dL
Triglycerides: 459 mg/dL — ABNORMAL HIGH (ref 0–149)

## 2016-01-08 LAB — COMPREHENSIVE METABOLIC PANEL
ALT: 18 IU/L (ref 0–32)
AST: 18 IU/L (ref 0–40)
Albumin/Globulin Ratio: 1.3 (ref 1.2–2.2)
Albumin: 4 g/dL (ref 3.5–5.5)
Alkaline Phosphatase: 77 IU/L (ref 39–117)
BUN/Creatinine Ratio: 22 (ref 9–23)
BUN: 17 mg/dL (ref 6–24)
Bilirubin Total: 0.3 mg/dL (ref 0.0–1.2)
CO2: 23 mmol/L (ref 18–29)
Calcium: 9.3 mg/dL (ref 8.7–10.2)
Chloride: 96 mmol/L (ref 96–106)
Creatinine, Ser: 0.77 mg/dL (ref 0.57–1.00)
GFR calc Af Amer: 107 mL/min/{1.73_m2} (ref >59)
GFR calc non Af Amer: 93 mL/min/{1.73_m2} (ref >59)
Globulin, Total: 3.2 g/dL (ref 1.5–4.5)
Glucose: 92 mg/dL (ref 65–99)
Potassium: 4.2 mmol/L (ref 3.5–5.2)
Sodium: 137 mmol/L (ref 134–144)
Total Protein: 7.2 g/dL (ref 6.0–8.5)

## 2016-01-08 LAB — URINALYSIS, ROUTINE W REFLEX MICROSCOPIC
Bilirubin, UA: NEGATIVE
Glucose, UA: NEGATIVE
Ketones, UA: NEGATIVE
Leukocytes, UA: NEGATIVE
Nitrite, UA: NEGATIVE
Protein, UA: NEGATIVE
RBC, UA: NEGATIVE
Specific Gravity, UA: 1.023 (ref 1.005–1.030)
Urobilinogen, Ur: 0.2 mg/dL (ref 0.2–1.0)
pH, UA: 5 (ref 5.0–7.5)

## 2016-01-11 ENCOUNTER — Encounter: Payer: Self-pay | Admitting: Internal Medicine

## 2016-01-25 ENCOUNTER — Other Ambulatory Visit: Payer: Self-pay | Admitting: Family Medicine

## 2016-01-25 ENCOUNTER — Ambulatory Visit: Payer: Self-pay | Admitting: Family Medicine

## 2016-01-25 ENCOUNTER — Ambulatory Visit
Admission: RE | Admit: 2016-01-25 | Discharge: 2016-01-25 | Disposition: A | Discharge status: Home / Self Care | Payer: 59 | Source: Ambulatory Visit | Attending: Internal Medicine | Admitting: Internal Medicine

## 2016-01-25 DIAGNOSIS — R928 Other abnormal and inconclusive findings on diagnostic imaging of breast: Secondary | ICD-10-CM | POA: Diagnosis not present

## 2016-01-25 DIAGNOSIS — N6489 Other specified disorders of breast: Secondary | ICD-10-CM

## 2016-02-08 MED ORDER — VITAMIN D (ERGOCALCIFEROL) 1.25 MG (50000 UNIT) PO CAPS: 50000.0000 [IU] | ORAL_CAPSULE | ORAL | 0 refills | Status: DC

## 2016-02-08 NOTE — Telephone Encounter (Signed)
Refill done.  

## 2016-02-16 ENCOUNTER — Encounter: Payer: Self-pay | Admitting: Internal Medicine

## 2016-02-17 ENCOUNTER — Other Ambulatory Visit: Payer: Self-pay | Admitting: Internal Medicine

## 2016-02-17 MED ORDER — BUPROPION HCL ER (XL) 150 MG PO TB24: 150.0000 mg | ORAL_TABLET | Freq: Every day | ORAL | 3 refills | Status: DC

## 2016-02-19 ENCOUNTER — Telehealth: Payer: Self-pay | Admitting: *Deleted

## 2016-02-19 ENCOUNTER — Ambulatory Visit (INDEPENDENT_AMBULATORY_CARE_PROVIDER_SITE_OTHER): Payer: Self-pay | Admitting: Certified Nurse Midwife

## 2016-02-19 VITALS — BP 102/78 | Ht 69.0 in | Wt 227.0 lb

## 2016-02-19 DIAGNOSIS — E669 Obesity, unspecified: Secondary | ICD-10-CM

## 2016-02-19 NOTE — Telephone Encounter (Signed)
Pt would like to begin weight loss medication-phentermine 37.5mg   Wt 227lb BP-102/78

## 2016-02-25 ENCOUNTER — Encounter: Payer: Self-pay | Admitting: Certified Nurse Midwife

## 2016-02-25 NOTE — Patient Instructions (Signed)

## 2016-02-25 NOTE — Progress Notes (Signed)
Subjective:  Patricia [name] is a 46 y.o. being seen today for weight loss management- initial visit.    Patient reports General ROS: negative and reports previous weight loss attempts:  Management changes made at the last visit include: adding medication.  Previous/Current treatment includes: small frequent feedings, nutritional supplement, vitamin supplement, gluten free diet, vitamin B-12 injections and appetite stimulant.  Risk factors include: job stress and circadian rhythm dysfunction.   The patient has a surgical history of: hysterectomy.   Past evaluation has included: metabolic profile, hemoglobin A1c, and thyroid panel.   Past treatment has included: small frequent feedings, nutritional supplement, vitamin supplement, social assistance,vitamin B-12 injections, appetite stimulant, exercise management and discontinuation of medication.  The following portions of the patient's history were reviewed and updated as appropriate: allergies, current medications, past family history, past medical history, past social history, past surgical history and problem list.   Objective:   Vitals:   02/25/16 1645  BP: 102/78  Weight: 227 lb (103 kg)  Height: 5\' 9"  (1.753 m)    General:  Alert, oriented and cooperative. Patient is in no acute distress.  PE: Well groomed female in no current distress,   Mental Status: Normal mood and affect. Normal behavior. Normal judgment and thought content.   Current BMI: Body mass index is 33.52 kg/m.   Assessment   Obesity  Plan:   1. Low carb, High protein diet   2. RX for adipex 37.5 mg daily and B12 1084mcg.ml monthly, to start now with first injection given at today's visit. Reviewed side-effects common to both medications and expected outcomes.  3. Increase daily water intake to at least 8 bottle a day, every day.  4. Goal is to reduse weight by 10% by end of three months, and will re-evaluate then.  5. RTC in 4 weeks for Nurse  visit to check weight & BP, and get next B12 injections.   Diona Fanti, CNM

## 2016-02-27 NOTE — Progress Notes (Signed)
[name] [place], [place] [address] Phone: [phone] Subjective:    CC:, low back pain follow-up, bilateral hand pain follow-up  [id number]  Patricia [name] is a 46 y.o. female coming in with complaint of right hip pain. Found to have more of a calcific bursitis that resolved with injection.  Low back seems to be doing relatively well. Mild increasing upper back pain. Has been known slipped rib syndrome. Has responded well to manipulation. Having increase activity at this time patient also has had increasing stress. An states that it is worsening symptoms overall.   Patient at last exam was having more bilateral hand n patient did have EMGs that showed patient did have carpal tunnel. Was given injection 2 months ago. Feeling significantly better at this time.  Past Medical History:  Diagnosis Date  . Premature atrial contractions    Echo 04/2014: Normal - EF 55-60%, No RWMA, Normal Vavles & Diastolic Fxn; 48 hr Monitor - Frequent PACs with1 short run of  PAT  . Thyroid disease    thyroid nodules   Past Surgical History:  Procedure Laterality Date  . 48 hour Holter Monitor  05/04/2014   5 PVCs, 359 PACs (some with aberrant conduction) 1 short run of 3 beats PAT, no true arrhythmia  . ABDOMINAL HYSTERECTOMY  Dec 2012   secondary to fibroid, heavy bleeding   . BREAST EXCISIONAL BIOPSY Right 1990   neg  . BREAST LUMPECTOMY Right 1991 or 1992   fibroadenoma  . TONSILLECTOMY  May 2012   [name]  . TRANSTHORACIC ECHOCARDIOGRAM  04/2014   NORMAL.  EF 55-60%, no Regional WMA, Norml Valves, normal diastolic Fxn, normal RV / RVSP   Social History   Social History  . Marital status: Married    Spouse name: N/A  . Number of children: N/A  . Years of education: N/A   Social History Main Topics  . Smoking status: Never Smoker  . Smokeless tobacco: Never Used  . Alcohol use Yes     Comment: occasional  . Drug use: No  .  Sexual activity: Not Asked   Other Topics Concern  . None   Social History Narrative  . None   Allergies  Allergen Reactions  . Clarithromycin   . Tramadol   . Zithromax [Azithromycin]    Family History  Problem Relation Age of Onset  . Diabetes Mother   . Hyperlipidemia Mother   . Hypertension Mother   . Breast cancer Mother 47    invasive mammary carcinoma  . Alcohol abuse Father   . Mental illness Father   . Mitral valve prolapse Father   . COPD Father   . Heart failure Father   . Diabetes Brother   . Cancer Paternal Grandmother     bladder  . Breast cancer Paternal Grandmother   . Breast cancer Other 17    maternal great aunt    Past medical history, social, surgical and family history all reviewed in electronic medical record.  No pertanent information unless stated regarding to the chief complaint.   Review of Systems: No headache, visual changes, nausea, vomiting, diarrhea, constipation, dizziness, abdominal pain, skin rash, fevers, chills, night sweats, weight loss, swollen lymph nodes, body aches, joint swelling, chest pain, shortness of breath, mood changes. Positive muscle aches   Objective  Blood pressure 100/78, pulse 74, height 5\' 9"  (1.753 m), weight 224 lb (101.6 kg).  Systems examined below as of 02/29/16 General:  NAD A&O x3 mood, affect normal  HEENT: Pupils equal, extraocular movements intact no nystagmus Respiratory: not short of breath at rest or with speaking Cardiovascular: No lower extremity edema, non tender Skin: Warm dry intact with no signs of infection or rash on extremities or on axial skeleton. Abdomen: Soft nontender, no masses Neuro: Cranial nerves  intact, neurovascularly intact in all extremities with 2+ DTRs and 2+ pulses. Lymph: No lymphadenopathy appreciated today  Gait normal with good balance and coordination.  MSK: Non tender with full range of motion and good stability and symmetric strength and tone of shoulders, elbows,    knee hips and ankles bilaterally.   Wrist: Bilateral Inspection normal with no visible erythema or swelling. ROM smooth and normal with good flexion and extension and ulnar/radial deviation that is symmetrical with opposite wrist. Palpation is normal over metacarpals, navicular, lunate, and TFCC; tendons without tenderness/ swelling No snuffbox tenderness. No tenderness over Canal of Guyon. Strength 5/5 in all directions without pain. Negative Finkelstein, tinel's and phalens. Negative Watson's test.     back exam still shows some tightness of the paraspinal musculature. Mild over the right sacroiliac joint positive Faber test.   Neck: Inspection unremarkable. No palpable stepoffs. Negative Spurling's maneuver. Limitation with right-sided rotation Grip strength and sensation normal in bilateral hands Strength good C4 to T1 distribution No sensory change to C4 to T1 Negative Hoffman sign bilaterally Reflexes normal Tightness more on the left side of the scapula. Some mild scapular dyskinesia noted. Slipped rib  Osteopathic findings Cervical C2 flexed rotated and side bent right C4 flexed rotated and side bent left C6 flexed rotated and side bent left T3 extended rotated and side bent left inhaled third rib T9 extended rotated and side bent left L2 flexed rotated and side bent right Sacrum right on right       Impression and Recommendations:     This case required medical decision making of moderate complexity.      Note: This dictation was prepared with Dragon dictation along with smaller phrase technology. Any transcriptional errors that result from this process are unintentional.

## 2016-02-29 ENCOUNTER — Encounter: Payer: Self-pay | Admitting: Family Medicine

## 2016-02-29 ENCOUNTER — Ambulatory Visit (INDEPENDENT_AMBULATORY_CARE_PROVIDER_SITE_OTHER): Payer: 59 | Admitting: Family Medicine

## 2016-02-29 VITALS — BP 100/78 | HR 74 | Ht 69.0 in | Wt 224.0 lb

## 2016-02-29 DIAGNOSIS — G5603 Carpal tunnel syndrome, bilateral upper limbs: Secondary | ICD-10-CM | POA: Diagnosis not present

## 2016-02-29 DIAGNOSIS — M999 Biomechanical lesion, unspecified: Secondary | ICD-10-CM

## 2016-02-29 DIAGNOSIS — M5136 Other intervertebral disc degeneration, lumbar region: Secondary | ICD-10-CM

## 2016-02-29 NOTE — Patient Instructions (Signed)
Great to see you  I am sorry for your mom but you are in good hands.  Ice is your friend Continue to work on your posture See you again in 4 weeks.

## 2016-02-29 NOTE — Assessment & Plan Note (Signed)
Stable overall. Continue conservative therapy. Encourage core strengthening. We discussed a stability. Hip abductor strengthening. Has medicines for breakthrough pain. Follow-up again in 4 weeks

## 2016-02-29 NOTE — Assessment & Plan Note (Signed)
Decision today to treat with OMT was based on Physical Exam  After verbal consent patient was treated with HVLA, ME, FPR techniques in lumbar, sacral and thoracic areas  Patient tolerated the procedure well with improvement in symptoms  Patient given exercises, stretches and lifestyle modifications  See medications in patient instructions if given  Patient will follow up in 4-6 weeks

## 2016-02-29 NOTE — Assessment & Plan Note (Signed)
Resolved after injections.

## 2016-03-29 ENCOUNTER — Ambulatory Visit: Payer: Self-pay | Admitting: Family Medicine

## 2016-04-05 ENCOUNTER — Other Ambulatory Visit: Payer: Self-pay | Admitting: Internal Medicine

## 2016-04-11 ENCOUNTER — Encounter: Payer: Self-pay | Admitting: Family Medicine

## 2016-04-11 ENCOUNTER — Ambulatory Visit (INDEPENDENT_AMBULATORY_CARE_PROVIDER_SITE_OTHER): Payer: 59 | Admitting: Family Medicine

## 2016-04-11 VITALS — BP 110/76 | HR 71 | Resp 16 | Wt 223.6 lb

## 2016-04-11 DIAGNOSIS — M999 Biomechanical lesion, unspecified: Secondary | ICD-10-CM

## 2016-04-11 DIAGNOSIS — M5136 Other intervertebral disc degeneration, lumbar region: Secondary | ICD-10-CM | POA: Diagnosis not present

## 2016-04-11 DIAGNOSIS — G5600 Carpal tunnel syndrome, unspecified upper limb: Secondary | ICD-10-CM | POA: Diagnosis not present

## 2016-04-11 DIAGNOSIS — G5603 Carpal tunnel syndrome, bilateral upper limbs: Secondary | ICD-10-CM | POA: Diagnosis not present

## 2016-04-11 MED ORDER — HYDROXYZINE HCL 25 MG PO TABS: 25.0000 mg | ORAL_TABLET | Freq: Three times a day (TID) | ORAL | 1 refills | Status: DC | PRN

## 2016-04-11 NOTE — Assessment & Plan Note (Signed)
Decision today to treat with OMT was based on Physical Exam  After verbal consent patient was treated with HVLA, ME, FPR techniques in cervical, thoracic, rib lumbar and sacral areas  Patient tolerated the procedure well with improvement in symptoms  Patient given exercises, stretches and lifestyle modifications  See medications in patient instructions if given  Patient will follow up in 4-6 weeks 

## 2016-04-11 NOTE — Assessment & Plan Note (Signed)
Patient does have degenerative disc disease that seems to be doing relatively well. Encourage her to continue the once weekly vitamin D. We discussed icing regimen and home exercises. Encourage her to continue to monitor ergonomics are out the day as well as much as possible. Patient and will come back and see me again in 4-6 weeks for further evaluation and treatment.

## 2016-04-11 NOTE — Progress Notes (Signed)
Pre-visit discussion using our clinic review tool. No additional management support is needed unless otherwise documented below in the visit note.  

## 2016-04-11 NOTE — Assessment & Plan Note (Signed)
Worsening symptoms on the left side at this point. Patient given a brace today. Will wear it at night. Patient will come back and see me again in 4-6 weeks. Worsening symptoms we'll consider repeating injection.

## 2016-04-11 NOTE — Progress Notes (Signed)
[name] [name], [place] [address] Phone: [phone] Subjective:    CC:, low back pain follow-up, bilateral hand pain follow-up  [id number]  Patricia [name] Patricia [name] is a 46 y.o. female coming in with complaint of   Low back seems to be doing relatively well. Patient is continuing to have increasing stress in her to the care for her mother. Patient states that this seems to cause her lower back as well as upper back symptoms. Has had difficult he with different medications we have tried previously. Wondering if there is anything else she can potentially take. Trying to stay active and finding time for self but finds it very difficult.   Patient at last exam was having more bilateral hand n patient did have EMGs that showed patient did have carpal tunnel. Was given injection 2 months ago. Was doing quite well but is starting to have more of a left wrist pain. Patient states that is having more of the tingling again. Seems worse in the mornings. Gets better throughout the day. Denies any weakness of the point but sometimes feels that she could drop something easily.  Past Medical History:  Diagnosis Date  . Premature atrial contractions    Echo 04/2014: Normal - EF 55-60%, No RWMA, Normal Vavles & Diastolic Fxn; 48 hr Monitor - Frequent PACs with1 short run of  PAT  . Thyroid disease    thyroid nodules   Past Surgical History:  Procedure Laterality Date  . 48 hour Holter Monitor  05/04/2014   5 PVCs, 359 PACs (some with aberrant conduction) 1 short run of 3 beats PAT, no true arrhythmia  . ABDOMINAL HYSTERECTOMY  Dec 2012   secondary to fibroid, heavy bleeding   . BREAST EXCISIONAL BIOPSY Right 1990   neg  . BREAST LUMPECTOMY Right 1991 or 1992   fibroadenoma  . TONSILLECTOMY  May 2012   [name]  . TRANSTHORACIC ECHOCARDIOGRAM  04/2014   NORMAL.  EF 55-60%, no Regional WMA, Norml Valves, normal diastolic Fxn, normal RV / RVSP    Social History   Social History  . Marital status: Married    Spouse name: N/A  . Number of children: N/A  . Years of education: N/A   Social History Main Topics  . Smoking status: Never Smoker  . Smokeless tobacco: Never Used  . Alcohol use Yes     Comment: occasional  . Drug use: No  . Sexual activity: Not Asked   Other Topics Concern  . None   Social History Narrative  . None   Allergies  Allergen Reactions  . Clarithromycin   . Tramadol   . Zithromax [Azithromycin]    Family History  Problem Relation Age of Onset  . Diabetes Mother   . Hyperlipidemia Mother   . Hypertension Mother   . Breast cancer Mother 75    invasive mammary carcinoma  . Alcohol abuse Father   . Mental illness Father   . Mitral valve prolapse Father   . COPD Father   . Heart failure Father   . Diabetes Brother   . Cancer Paternal Grandmother     bladder  . Breast cancer Paternal Grandmother   . Breast cancer Other 70    maternal great aunt    Past medical history, social, surgical and family history all reviewed in electronic medical record.  No pertanent information unless stated regarding to the chief complaint.   Review of Systems: No headache, visual  changes, nausea, vomiting, diarrhea, constipation, dizziness, abdominal pain, skin rash, fevers, chills, night sweats, weight loss, swollen lymph nodes, chest pain, shortness of breath, mood changes.  Positive muscle aches, body aches, anxiety   Objective  Blood pressure 110/76, pulse 71, resp. rate 16, weight 223 lb 9.6 oz (101.4 kg), SpO2 96 %.  Systems examined below as of 04/11/16 General: NAD A&O x3 mood, affect normal  HEENT: Pupils equal, extraocular movements intact no nystagmus Respiratory: not short of breath at rest or with speaking Cardiovascular: No lower extremity edema, non tender Skin: Warm dry intact with no signs of infection or rash on extremities or on axial skeleton. Abdomen: Soft nontender, no  masses Neuro: Cranial nerves  intact, neurovascularly intact in all extremities with 2+ DTRs and 2+ pulses. Lymph: No lymphadenopathy appreciated today  Gait normal with good balance and coordination.  MSK: Non tender with full range of motion and good stability and symmetric strength and tone of shoulders, elbows, ,  knee hips and ankles bilaterally.    Wrist: Bilateral Inspection normal with no visible erythema or swelling. ROM smooth and normal with good flexion and extension and ulnar/radial deviation that is symmetrical with opposite wrist. Palpation is normal over metacarpals, navicular, lunate, and TFCC; tendons without tenderness/ swelling No snuffbox tenderness. No tenderness over Canal of Guyon. Strength 5/5 in all directions without pain. Negative Finkelstein, Mild positive  tinel's and phalens left greater than right Negative Watson's test.    Low back is still shows some mild tightness of the thoracolumbar juncture. More tenderness on the right side of the paraspinal musculature than left. Mild tightness of the hip flexors and the hamstring.   Neck: Inspection unremarkable. No palpable stepoffs. Negative Spurling's maneuver. Full neck range of motion Grip strength and sensation normal in bilateral hands Strength good C4 to T1 distribution No sensory change to C4 to T1 Negative Hoffman sign bilaterally Reflexes normal Continue mild spasm of the left trapezius  Osteopathic findings Cervical C3 flexed rotated and side bent right C7 flexed rotated and side bent left T3 extended rotated and side bent left inhaled third rib T8 extended rotated and side bent left L2 flexed rotated and side bent right Sacrum right on right       Impression and Recommendations:     This case required medical decision making of moderate complexity.      Note: This dictation was prepared with Dragon dictation along with smaller phrase technology. Any transcriptional errors  that result from this process are unintentional.

## 2016-04-11 NOTE — Patient Instructions (Signed)
Good to see you  Your back is making progress try hydroxyzine up to 3 times a day and may want to start with a half pill first For the wrist wear the brace at night Pennsaid on the wrist at night, fingertip amount.  See me again in 4-6 weeks

## 2016-05-16 ENCOUNTER — Encounter: Payer: Self-pay | Admitting: Family Medicine

## 2016-05-16 ENCOUNTER — Ambulatory Visit (INDEPENDENT_AMBULATORY_CARE_PROVIDER_SITE_OTHER): Payer: 59 | Admitting: Family Medicine

## 2016-05-16 VITALS — BP 108/82 | HR 80 | Resp 16 | Wt 221.4 lb

## 2016-05-16 DIAGNOSIS — M5136 Other intervertebral disc degeneration, lumbar region: Secondary | ICD-10-CM | POA: Diagnosis not present

## 2016-05-16 DIAGNOSIS — M999 Biomechanical lesion, unspecified: Secondary | ICD-10-CM

## 2016-05-16 NOTE — Assessment & Plan Note (Signed)
Decision today to treat with OMT was based on Physical Exam  After verbal consent patient was treated with HVLA, ME, FPR techniques in cervical, thoracic, rib lumbar and sacral areas  Patient tolerated the procedure well with improvement in symptoms  Patient given exercises, stretches and lifestyle modifications  See medications in patient instructions if given  Patient will follow up in 4 weeks 

## 2016-05-16 NOTE — Progress Notes (Signed)
[name] [address] [place], [place] [address] Phone: [phone] Subjective:    CC:, low back pain follow-up, bilateral hand pain follow-up  [id number]  Patricia [name] is a 46 y.o. female coming in with complaint of   Low back seems to be doing relatively well. Stable overall.  Was great for 2 weeks and then started coming back.  Ice has helped Helping  Her mother with certain things such as transition.patient states it is more of a dull aching pain. Still seems to be me around scapular region.patient state she hasn't all of a sudden started worsening again over the course of he last 2 weeks. Mild worsening she thinks the last week again.    Patient at last exam was having more bilateral hand n patient did have EMGs that showed patient did have carpal tunnel. Been 6 months since injection and doing well.  Wears brace at night  No real trouble during the day if that is not doing well.   Past Medical History:  Diagnosis Date  . Premature atrial contractions    Echo 04/2014: Normal - EF 55-60%, No RWMA, Normal Vavles & Diastolic Fxn; 48 hr Monitor - Frequent PACs with1 short run of  PAT  . Thyroid disease    thyroid nodules   Past Surgical History:  Procedure Laterality Date  . 48 hour Holter Monitor  05/04/2014   5 PVCs, 359 PACs (some with aberrant conduction) 1 short run of 3 beats PAT, no true arrhythmia  . ABDOMINAL HYSTERECTOMY  Dec 2012   secondary to fibroid, heavy bleeding   . BREAST EXCISIONAL BIOPSY Right 1990   neg  . BREAST LUMPECTOMY Right 1991 or 1992   fibroadenoma  . TONSILLECTOMY  May 2012   [name]  . TRANSTHORACIC ECHOCARDIOGRAM  04/2014   NORMAL.  EF 55-60%, no Regional WMA, Norml Valves, normal diastolic Fxn, normal RV / RVSP   Social History   Social History  . Marital status: Married    Spouse name: N/A  . Number of children: N/A  . Years of education: N/A   Social History Main Topics  .  Smoking status: Never Smoker  . Smokeless tobacco: Never Used  . Alcohol use Yes     Comment: occasional  . Drug use: No  . Sexual activity: Not Asked   Other Topics Concern  . None   Social History Narrative  . None   Allergies  Allergen Reactions  . Clarithromycin   . Tramadol   . Zithromax [Azithromycin]    Family History  Problem Relation Age of Onset  . Diabetes Mother   . Hyperlipidemia Mother   . Hypertension Mother   . Breast cancer Mother 60    invasive mammary carcinoma  . Alcohol abuse Father   . Mental illness Father   . Mitral valve prolapse Father   . COPD Father   . Heart failure Father   . Diabetes Brother   . Cancer Paternal Grandmother     bladder  . Breast cancer Paternal Grandmother   . Breast cancer Other 53    maternal great aunt    Past medical history, social, surgical and family history all reviewed in electronic medical record.  No pertanent information unless stated regarding to the chief complaint.   Review of Systems: No headache, visual changes, nausea, vomiting, diarrhea, constipation, dizziness, abdominal pain, skin rash, fevers, chills, night sweats, weight loss, swollen lymph nodes, body aches, joint swelling, muscle  aches, chest pain, shortness of breath, mood changes.  Still stress over mother illness.    Objective  Blood pressure 108/82, pulse 80, resp. rate 16, weight 221 lb 6 oz (100.4 kg), SpO2 98 %.  Systems examined below as of 05/16/16 General: NAD A&O x3 mood, affect normal  HEENT: Pupils equal, extraocular movements intact no nystagmus Respiratory: not short of breath at rest or with speaking Cardiovascular: No lower extremity edema, non tender Skin: Warm dry intact with no signs of infection or rash on extremities or on axial skeleton. Abdomen: Soft nontender, no masses Neuro: Cranial nerves  intact, neurovascularly intact in all extremities with 2+ DTRs and 2+ pulses. Lymph: No lymphadenopathy appreciated today    Gait normal with good balance and coordination.  MSK: Non tender with full range of motion and good stability and symmetric strength and tone of shoulders, elbows, wrist,  knee hips and ankles bilaterally.   Back Exam:  Inspection: Unremarkable  Motion: Flexion 45 deg, Extension 25 deg, Side Bending to 35 deg bilaterally,  Rotation to 45 deg bilaterally  SLR laying: Negative  XSLR laying: Negative  Palpable tenderness:  Tender to palpation in the lumbar spine and in the right scapular region.  FABER: negative. Sensory change: Gross sensation intact to all lumbar and sacral dermatomes.  Reflexes: 2+ at both patellar tendons, 2+ at achilles tendons, Babinski's downgoing.  Strength at foot  Plantar-flexion: 5/5 Dorsi-flexion: 5/5 Eversion: 5/5 Inversion: 5/5  Leg strength  Quad: 5/5 Hamstring: 5/5 Hip flexor: 5/5 Hip abductors: 5/5  Gait unremarkable.  Neck: Inspection unremarkable. No palpable stepoffs. Negative Spurling's maneuver. Full neck range of motion Grip strength and sensation normal in bilateral hands Strength good C4 to T1 distribution No sensory change to C4 to T1 Negative Hoffman sign bilaterally Reflexes normal  Osteopathic findings C2 flexed rotated and side bent right C4 flexed rotated and side bent left C6 flexed rotated and side bent left T5 extended rotated and side bent right inhaled rib T10 extended rotated and side bent left L2 flexed rotated and side bent right Sacrum right on right     Impression and Recommendations:     This case required medical decision making of moderate complexity.      Note: This dictation was prepared with Dragon dictation along with smaller phrase technology. Any transcriptional errors that result from this process are unintentional.

## 2016-05-16 NOTE — Assessment & Plan Note (Signed)
Stable responding well to OMT.  Discuss core and stability  HEP given  RTC in 4 weeks.

## 2016-05-16 NOTE — Patient Instructions (Addendum)
Good to see you  You are doing great  Keep trying to find time for yourself.  Continue the meds Continue to be active 2 tennis balls in a tube sock and lay on them where head meets neck for 5 minutes to stop headache.  See me again in 4 weeks.

## 2016-05-23 ENCOUNTER — Encounter: Payer: Self-pay | Admitting: Internal Medicine

## 2016-05-25 ENCOUNTER — Other Ambulatory Visit: Payer: Self-pay | Admitting: Family Medicine

## 2016-05-29 ENCOUNTER — Encounter: Payer: Self-pay | Admitting: Family Medicine

## 2016-05-30 DIAGNOSIS — H524 Presbyopia: Secondary | ICD-10-CM | POA: Diagnosis not present

## 2016-05-30 DIAGNOSIS — H52223 Regular astigmatism, bilateral: Secondary | ICD-10-CM | POA: Diagnosis not present

## 2016-06-01 ENCOUNTER — Other Ambulatory Visit: Payer: Self-pay | Admitting: Internal Medicine

## 2016-06-03 ENCOUNTER — Other Ambulatory Visit (INDEPENDENT_AMBULATORY_CARE_PROVIDER_SITE_OTHER): Payer: 59

## 2016-06-03 DIAGNOSIS — E079 Disorder of thyroid, unspecified: Secondary | ICD-10-CM | POA: Diagnosis not present

## 2016-06-03 DIAGNOSIS — I10 Essential (primary) hypertension: Secondary | ICD-10-CM | POA: Diagnosis not present

## 2016-06-03 DIAGNOSIS — R739 Hyperglycemia, unspecified: Secondary | ICD-10-CM | POA: Diagnosis not present

## 2016-06-03 DIAGNOSIS — E784 Other hyperlipidemia: Secondary | ICD-10-CM

## 2016-06-03 DIAGNOSIS — E7849 Other hyperlipidemia: Secondary | ICD-10-CM

## 2016-06-03 LAB — COMPREHENSIVE METABOLIC PANEL
ALT: 12 U/L (ref 0–35)
AST: 11 U/L (ref 0–37)
Albumin: 3.7 g/dL (ref 3.5–5.2)
Alkaline Phosphatase: 51 U/L (ref 39–117)
BUN: 12 mg/dL (ref 6–23)
CO2: 28 mEq/L (ref 19–32)
Calcium: 8.9 mg/dL (ref 8.4–10.5)
Chloride: 101 mEq/L (ref 96–112)
Creatinine, Ser: 0.85 mg/dL (ref 0.40–1.20)
GFR: 76.33 mL/min (ref >60.00)
Glucose, Bld: 125 mg/dL — ABNORMAL HIGH (ref 70–99)
Potassium: 3.9 mEq/L (ref 3.5–5.1)
Sodium: 137 mEq/L (ref 135–145)
Total Bilirubin: 0.5 mg/dL (ref 0.2–1.2)
Total Protein: 6.7 g/dL (ref 6.0–8.3)

## 2016-06-03 LAB — HEMOGLOBIN A1C: Hgb A1c MFr Bld: 6.3 % (ref 4.6–6.5)

## 2016-06-03 LAB — MICROALBUMIN / CREATININE URINE RATIO
Creatinine,U: 184.6 mg/dL
Microalb Creat Ratio: 0.4 mg/g (ref 0.0–30.0)
Microalb, Ur: <0.7 mg/dL (ref 0.0–1.9)

## 2016-06-03 LAB — LDL CHOLESTEROL, DIRECT: Direct LDL: 104 mg/dL

## 2016-06-06 ENCOUNTER — Encounter: Payer: Self-pay | Admitting: Internal Medicine

## 2016-06-07 ENCOUNTER — Encounter: Payer: Self-pay | Admitting: Internal Medicine

## 2016-06-07 ENCOUNTER — Ambulatory Visit (INDEPENDENT_AMBULATORY_CARE_PROVIDER_SITE_OTHER): Payer: 59 | Admitting: Internal Medicine

## 2016-06-07 VITALS — BP 112/74 | HR 81 | Temp 98.2°F | Resp 16 | Ht 69.0 in | Wt 214.8 lb

## 2016-06-07 DIAGNOSIS — F324 Major depressive disorder, single episode, in partial remission: Secondary | ICD-10-CM | POA: Diagnosis not present

## 2016-06-07 DIAGNOSIS — Z0000 Encounter for general adult medical examination without abnormal findings: Secondary | ICD-10-CM

## 2016-06-07 DIAGNOSIS — E669 Obesity, unspecified: Secondary | ICD-10-CM

## 2016-06-07 DIAGNOSIS — E119 Type 2 diabetes mellitus without complications: Secondary | ICD-10-CM | POA: Diagnosis not present

## 2016-06-07 DIAGNOSIS — Z803 Family history of malignant neoplasm of breast: Secondary | ICD-10-CM | POA: Diagnosis not present

## 2016-06-07 DIAGNOSIS — I10 Essential (primary) hypertension: Secondary | ICD-10-CM

## 2016-06-07 DIAGNOSIS — R928 Other abnormal and inconclusive findings on diagnostic imaging of breast: Secondary | ICD-10-CM | POA: Diagnosis not present

## 2016-06-07 MED ORDER — METFORMIN HCL ER 500 MG PO TB24: 500.0000 mg | ORAL_TABLET | Freq: Every day | ORAL | 1 refills | Status: DC

## 2016-06-07 MED ORDER — PHENTERMINE HCL 37.5 MG PO TABS
37.5000 mg | ORAL_TABLET | Freq: Every day | ORAL | 2 refills | Status: DC
Start: 2016-06-07 — End: 2016-10-10

## 2016-06-07 NOTE — Progress Notes (Signed)
Patient ID: Patricia [name], female    DOB: [birth year]  Age: 46 y.o. MRN: [id number]  The patient is here for annualexamination and management of other chronic and acute problems.   Due for bilateral diag mammogram and [place] on right due  in June 15 or later.  Needs  Tuesday late afternon/evening          The risk factors are reflected in the social history.  The roster of all physicians providing medical care to patient - is listed in the Snapshot section of the chart.  Activities of daily living:  The patient is 100% independent in all ADLs: dressing, toileting, feeding as well as independent mobility  Home safety : The patient has smoke detectors in the home. They wear seatbelts.  There are no firearms at home. There is no violence in the home.   There is no risks for hepatitis, STDs or HIV. There is no   history of blood transfusion. They have no travel history to infectious disease endemic areas of the world.  The patient has seen their dentist in the last six month. They have seen their eye doctor in the last year. T   They do not  have excessive sun exposure. Discussed the need for sun protection: hats, long sleeves and use of sunscreen if there is significant sun exposure.   Diet: the importance of a healthy diet is discussed. They do have a healthy diet.  The benefits of regular aerobic exercise were discussed. She walks 4 times per week ,  20 minutes.   Depression screen: there are no signs or vegative symptoms of depression- irritability, change in appetite, anhedonia, sadness/tearfullness..  The following portions of the patient's history were reviewed and updated as appropriate: allergies, current medications, past family history, past medical history,  past surgical history, past social history  and problem list.  Visual acuity was not assessed per patient preference since she has regular follow up with her ophthalmologist. Hearing and body mass index were assessed and  reviewed.   During the course of the visit the patient was educated and counseled about appropriate screening and preventive services including : fall prevention , diabetes screening, nutrition counseling, colorectal cancer screening, and recommended immunizations.    CC: The primary encounter diagnosis was Abnormal findings on diagnostic imaging of breast. Diagnoses of Diabetes mellitus without complication ([facility]), Major depressive disorder with single episode, in partial remission ([facility]), Essential hypertension, Visit for preventive health examination, Family history of secondary breast cancer, and Obesity (BMI 30.0-34.9) were also pertinent to this visit.  Using phentemrine for weight management 3rd month. Weight loss 12 months  Taking only losartan .  Stopped hctz due to sunburning with any exposure  Mother has PR positive metastatic breast ca so she has stopped taking her progesterone.  Using ambien prn insomnia, mostly with travel,  Avoids daily use.  Using hydroxyzine for back pain, helps with sleep. FH of  Melanoma in brother ,  Had eye exam ,  Sees Phillip Heal in October annually Genetic testing negative   Back pain worse in the am.  Non radiating   History Patricia [name] has a past medical history of Premature atrial contractions and Thyroid disease.   She has a past surgical history that includes Abdominal hysterectomy (Dec 2012); Tonsillectomy (May 2012); transthoracic echocardiogram (04/2014); 48 hour Holter Monitor (05/04/2014); Breast lumpectomy (Right, 1991 or 1992); and Breast excisional biopsy (Right, 1990).   Her family history includes Alcohol abuse in her father; Breast cancer in  her paternal grandmother; Breast cancer (age of onset: 35) in her mother and other; COPD in her father; Cancer in her paternal grandmother; Diabetes in her brother and mother; Heart failure in her father; Hyperlipidemia in her mother; Hypertension in her mother; Mental illness in her father; Mitral valve prolapse in  her father.She reports that she has never smoked. She has never used smokeless tobacco. She reports that she drinks alcohol. She reports that she does not use drugs.  Outpatient Medications Prior to Visit  Medication Sig Dispense Refill  . buPROPion (WELLBUTRIN XL) 150 MG 24 hr tablet Take 1 tablet (150 mg total) by mouth daily. 30 tablet 3  . cyclobenzaprine (FLEXERIL) 10 MG tablet TAKE 1 TABLET (10 MG TOTAL) BY MOUTH 3 (THREE) TIMES DAILY AS NEEDED FOR MUSCLE SPASMS. 90 tablet 2  . hydrOXYzine (ATARAX/VISTARIL) 25 MG tablet Take 1 tablet (25 mg total) by mouth 3 (three) times daily as needed. 90 tablet 1  . ibuprofen (ADVIL,MOTRIN) 800 MG tablet Take 1 tablet (800 mg total) by mouth 3 (three) times daily as needed. 90 tablet 3  . loratadine (CLARITIN) 10 MG tablet Take 1 tablet (10 mg total) by mouth daily. 90 tablet 2  . montelukast (SINGULAIR) 10 MG tablet TAKE 1 TABLET (10 MG TOTAL) BY MOUTH AT BEDTIME. 90 tablet 3  . pramipexole (MIRAPEX) 0.75 MG tablet TAKE 1 TABLET BY MOUTH 3 TIMES DAILY 90 tablet 2  . Vitamin D, Ergocalciferol, (DRISDOL) 50000 units CAPS capsule TAKE 1 CAPSULE BY MOUTH EVERY 7 DAYS. 12 capsule 0  . zolpidem (AMBIEN) 10 MG tablet TAKE 1 TABLET BY MOUTH AT BEDTIME AS NEEDED FOR SLEEP 30 tablet 1  . Biotin 1000 MCG tablet Take 1 tablet (1 mg total) by mouth 3 (three) times daily. (Patient not taking: Reported on 06/07/2016) 90 tablet 3  . diclofenac (VOLTAREN) 75 MG EC tablet TAKE 1 TABLET (75 MG TOTAL) BY MOUTH 2 (TWO) TIMES DAILY. (Patient not taking: Reported on 06/07/2016) 60 tablet 3  . losartan-hydrochlorothiazide (HYZAAR) 100-12.5 MG tablet Take 1 tablet by mouth daily. (Patient not taking: Reported on 06/07/2016) 90 tablet 3  . progesterone (PROMETRIUM) 200 MG capsule Take 1 capsule (200 mg total) by mouth daily. (Patient not taking: Reported on 06/07/2016) 90 capsule 22  . Vitamin D, Ergocalciferol, (DRISDOL) 50000 units CAPS capsule Take 1 capsule (50,000 Units total) by  mouth every 7 (seven) days. (Patient not taking: Reported on 06/07/2016) 12 capsule 0   No facility-administered medications prior to visit.     Review of Systems   Patient denies headache, fevers, malaise, unintentional weight loss, skin rash, eye pain, sinus congestion and sinus pain, sore throat, dysphagia,  hemoptysis , cough, dyspnea, wheezing, chest pain, palpitations, orthopnea, edema, abdominal pain, nausea, melena, diarrhea, constipation, flank pain, dysuria, hematuria, urinary  Frequency, nocturia, numbness, tingling, seizures,  Focal weakness, Loss of consciousness,  Tremor, insomnia, depression, anxiety, and suicidal ideation.      Objective:  BP 112/74 (BP Location: Left Arm, Patient Position: Sitting, Cuff Size: Large)   Pulse 81   Temp 98.2 F (36.8 C) (Oral)   Resp 16   Ht _0  (1.753 m)   Wt 214 lb 12.8 oz (97.4 kg)   SpO2 99%   BMI 31.72 kg/m   Physical Exam   General appearance: alert, cooperative and appears stated age Head: Normocephalic, without obvious abnormality, atraumatic Eyes: conjunctivae/corneas clear. PERRL, EOM's intact. Fundi benign. Ears: normal TM's and external ear canals both ears Nose: Nares  normal. Septum midline. Mucosa normal. No drainage or sinus tenderness. Throat: lips, mucosa, and tongue normal; teeth and gums normal Neck: no adenopathy, no carotid bruit, no JVD, supple, symmetrical, trachea midline and thyroid not enlarged, symmetric, no tenderness/mass/nodules Lungs: clear to auscultation bilaterally Breasts: normal appearance, no masses or tenderness Heart: regular rate and rhythm, S1, S2 normal, no murmur, click, rub or gallop Abdomen: soft, non-tender; bowel sounds normal; no masses,  no organomegaly Extremities: extremities normal, atraumatic, no cyanosis or edema Pulses: 2+ and symmetric Skin: Skin color, texture, turgor normal. No rashes or lesions Neurologic: Alert and oriented X 3, normal strength and tone. Normal symmetric  reflexes. Normal coordination and gait.      Assessment & Plan:   Problem List Items Addressed This Visit    Visit for preventive health examination    Annual comprehensive preventive exam was done as well as an evaluation and management of chronic conditions .  During the course of the visit the patient was educated and counseled about appropriate screening and preventive services including :  diabetes screening, lipid analysis with projected  10 year  risk for CAD , nutrition counseling, breast, cervical and colorectal cancer screening, and recommended immunizations.  Printed recommendations for health maintenance screenings was given      Obesity (BMI 30.0-34.9)    Improved with  recent initiation of phentermine for appetite suppression.  She is tolerating the medication without side effects .  She is taking the medication twice daily in a divided dose and  has lost 12 lbs in the last 3 months and in general is feeling better.  Continue medication,  Refills given for 3 months   Body mass index is 31.72 kg/m.       Relevant Medications   phentermine (ADIPEX-P) 37.5 MG tablet   metFORMIN (GLUCOPHAGE-XR) 500 MG 24 hr tablet   Major depressive disorder, single episode    Managed with wellbutrin a.. Se has discontinued prometrium 21 days/month due to maternal history of PR positive breast cancer      Family history of secondary breast cancer    Mother has been diagnosed with PR positive breast cancer.  She has had genetic testing which was reportedly negative for BRCA1 and 2 mutations       Essential hypertension    Well controlled on current regimen. Renal function stable, no changes today.  Lab Results  Component Value Date   CREATININE 0.85 06/03/2016   Lab Results  Component Value Date   NA 137 06/03/2016   K 3.9 06/03/2016   CL 101 06/03/2016   CO2 28 06/03/2016         Relevant Medications   losartan (COZAAR) 100 MG tablet   Diabetes mellitus without complication  (HCC)    Fasting glucose of 125.  a1c low enough to delay need for medication.  lowGI diet,  Exercise, and weight loss advised.   Lab Results  Component Value Date   HGBA1C 6.3 06/03/2016         Relevant Medications   losartan (COZAAR) 100 MG tablet   metFORMIN (GLUCOPHAGE-XR) 500 MG 24 hr tablet    Other Visit Diagnoses    Abnormal findings on diagnostic imaging of breast    -  Primary   Relevant Orders   MM Digital Diagnostic Bilat   US BREAST COMPLETE UNI RIGHT INC AXILLA      I have discontinued Patricia [name]'s Biotin, progesterone, losartan-hydrochlorothiazide, and diclofenac. I have also changed her phentermine. Additionally, I am  having her start on metFORMIN. Lastly, I am having her maintain her loratadine, ibuprofen, cyclobenzaprine, zolpidem, buPROPion, pramipexole, hydrOXYzine, Vitamin D (Ergocalciferol), montelukast, losartan, and Magnesium Oxide.  Meds ordered this encounter  Medications  . DISCONTD: phentermine (ADIPEX-P) 37.5 MG tablet    Refill:  2  . losartan (COZAAR) 100 MG tablet    Sig: Take 100 mg by mouth daily.  . Magnesium Oxide 200 MG TABS    Sig: Take 1 tablet by mouth daily.  . phentermine (ADIPEX-P) 37.5 MG tablet    Sig: Take 1 tablet (37.5 mg total) by mouth daily before breakfast.    Dispense:  30 tablet    Refill:  2  . metFORMIN (GLUCOPHAGE-XR) 500 MG 24 hr tablet    Sig: Take 1 tablet (500 mg total) by mouth daily with breakfast.    Dispense:  90 tablet    Refill:  1    Medications Discontinued During This Encounter  Medication Reason  . Biotin 1000 MCG tablet Patient has not taken in last 30 days  . diclofenac (VOLTAREN) 75 MG EC tablet Patient has not taken in last 30 days  . losartan-hydrochlorothiazide (HYZAAR) 100-12.5 MG tablet Patient has not taken in last 30 days  . progesterone (PROMETRIUM) 200 MG capsule Patient has not taken in last 30 days  . Vitamin D, Ergocalciferol, (DRISDOL) 50000 units CAPS capsule Duplicate  .  phentermine (ADIPEX-P) 37.5 MG tablet Reorder    Follow-up: Return in about 3 months (around 09/07/2016) for  90+ days , labs prior .   [name], MD

## 2016-06-07 NOTE — Patient Instructions (Signed)
I agree with suspending hctz  I agree with starting metformin  I have refilled the phentermine for 3 more months  RTC for labs /visit in  3-4 months  Mammogram ordered   Health Maintenance, Female Adopting a healthy lifestyle and getting preventive care can go a long way to promote health and wellness. Talk with your health care provider about what schedule of regular examinations is right for you. This is a good chance for you to check in with your provider about disease prevention and staying healthy. In between checkups, there are plenty of things you can do on your own. Experts have done a lot of research about which lifestyle changes and preventive measures are most likely to keep you healthy. Ask your health care provider for more information. Weight and diet Eat a healthy diet  Be sure to include plenty of vegetables, fruits, low-fat dairy products, and lean protein.  Do not eat a lot of foods high in solid fats, added sugars, or salt.  Get regular exercise. This is one of the most important things you can do for your health.  Most adults should exercise for at least 150 minutes each week. The exercise should increase your heart rate and make you sweat (moderate-intensity exercise).  Most adults should also do strengthening exercises at least twice a week. This is in addition to the moderate-intensity exercise. Maintain a healthy weight  Body mass index (BMI) is a measurement that can be used to identify possible weight problems. It estimates body fat based on height and weight. Your health care provider can help determine your BMI and help you achieve or maintain a healthy weight.  For females 85 years of age and older:  A BMI below 18.5 is considered underweight.  A BMI of 18.5 to 24.9 is normal.  A BMI of 25 to 29.9 is considered overweight.  A BMI of 30 and above is considered obese. Watch levels of cholesterol and blood lipids  You should start having your blood  tested for lipids and cholesterol at 47 years of age, then have this test every 5 years.  You may need to have your cholesterol levels checked more often if:  Your lipid or cholesterol levels are high.  You are older than 47 years of age.  You are at high risk for heart disease. Cancer screening Lung Cancer  Lung cancer screening is recommended for adults 35-48 years old who are at high risk for lung cancer because of a history of smoking.  A yearly low-dose CT scan of the lungs is recommended for people who:  Currently smoke.  Have quit within the past 15 years.  Have at least a 30-pack-year history of smoking. A pack year is smoking an average of one pack of cigarettes a day for 1 year.  Yearly screening should continue until it has been 15 years since you quit.  Yearly screening should stop if you develop a health problem that would prevent you from having lung cancer treatment. Breast Cancer  Practice breast self-awareness. This means understanding how your breasts normally appear and feel.  It also means doing regular breast self-exams. Let your health care provider know about any changes, no matter how small.  If you are in your 20s or 30s, you should have a clinical breast exam (CBE) by a health care provider every 1-3 years as part of a regular health exam.  If you are 68 or older, have a CBE every year. Also consider having a  breast X-ray (mammogram) every year.  If you have a family history of breast cancer, talk to your health care provider about genetic screening.  If you are at high risk for breast cancer, talk to your health care provider about having an MRI and a mammogram every year.  Breast cancer gene (BRCA) assessment is recommended for women who have family members with BRCA-related cancers. BRCA-related cancers include:  Breast.  Ovarian.  Tubal.  Peritoneal cancers.  Results of the assessment will determine the need for genetic counseling and  BRCA1 and BRCA2 testing. Cervical Cancer  Your health care provider may recommend that you be screened regularly for cancer of the pelvic organs (ovaries, uterus, and vagina). This screening involves a pelvic examination, including checking for microscopic changes to the surface of your cervix (Pap test). You may be encouraged to have this screening done every 3 years, beginning at age 73.  For women ages 37-65, health care providers may recommend pelvic exams and Pap testing every 3 years, or they may recommend the Pap and pelvic exam, combined with testing for human papilloma virus (HPV), every 5 years. Some types of HPV increase your risk of cervical cancer. Testing for HPV may also be done on women of any age with unclear Pap test results.  Other health care providers may not recommend any screening for nonpregnant women who are considered low risk for pelvic cancer and who do not have symptoms. Ask your health care provider if a screening pelvic exam is right for you.  If you have had past treatment for cervical cancer or a condition that could lead to cancer, you need Pap tests and screening for cancer for at least 20 years after your treatment. If Pap tests have been discontinued, your risk factors (such as having a new sexual partner) need to be reassessed to determine if screening should resume. Some women have medical problems that increase the chance of getting cervical cancer. In these cases, your health care provider may recommend more frequent screening and Pap tests. Colorectal Cancer  This type of cancer can be detected and often prevented.  Routine colorectal cancer screening usually begins at 47 years of age and continues through 47 years of age.  Your health care provider may recommend screening at an earlier age if you have risk factors for colon cancer.  Your health care provider may also recommend using home test kits to check for hidden blood in the stool.  A small camera at  the end of a tube can be used to examine your colon directly (sigmoidoscopy or colonoscopy). This is done to check for the earliest forms of colorectal cancer.  Routine screening usually begins at age 29.  Direct examination of the colon should be repeated every 5-10 years through 47 years of age. However, you may need to be screened more often if early forms of precancerous polyps or small growths are found. Skin Cancer  Check your skin from head to toe regularly.  Tell your health care provider about any new moles or changes in moles, especially if there is a change in a mole's shape or color.  Also tell your health care provider if you have a mole that is larger than the size of a pencil eraser.  Always use sunscreen. Apply sunscreen liberally and repeatedly throughout the day.  Protect yourself by wearing long sleeves, pants, a wide-brimmed hat, and sunglasses whenever you are outside. Heart disease, diabetes, and high blood pressure  High blood pressure causes  heart disease and increases the risk of stroke. High blood pressure is more likely to develop in:  People who have blood pressure in the high end of the normal range (130-139/85-89 mm Hg).  People who are overweight or obese.  People who are African American.  If you are 92-44 years of age, have your blood pressure checked every 3-5 years. If you are 40 years of age or older, have your blood pressure checked every year. You should have your blood pressure measured twice-once when you are at a hospital or clinic, and once when you are not at a hospital or clinic. Record the average of the two measurements. To check your blood pressure when you are not at a hospital or clinic, you can use:  An automated blood pressure machine at a pharmacy.  A home blood pressure monitor.  If you are between 4 years and 47 years old, ask your health care provider if you should take aspirin to prevent strokes.  Have regular diabetes  screenings. This involves taking a blood sample to check your fasting blood sugar level.  If you are at a normal weight and have a low risk for diabetes, have this test once every three years after 47 years of age.  If you are overweight and have a high risk for diabetes, consider being tested at a younger age or more often. Preventing infection Hepatitis B  If you have a higher risk for hepatitis B, you should be screened for this virus. You are considered at high risk for hepatitis B if:  You were born in a country where hepatitis B is common. Ask your health care provider which countries are considered high risk.  Your parents were born in a high-risk country, and you have not been immunized against hepatitis B (hepatitis B vaccine).  You have HIV or AIDS.  You use needles to inject street drugs.  You live with someone who has hepatitis B.  You have had sex with someone who has hepatitis B.  You get hemodialysis treatment.  You take certain medicines for conditions, including cancer, organ transplantation, and autoimmune conditions. Hepatitis C  Blood testing is recommended for:  Everyone born from [birth year] through 1966.  Anyone with known risk factors for hepatitis C. Sexually transmitted infections (STIs)  You should be screened for sexually transmitted infections (STIs) including gonorrhea and chlamydia if:  You are sexually active and are younger than 47 years of age.  You are older than 47 years of age and your health care provider tells you that you are at risk for this type of infection.  Your sexual activity has changed since you were last screened and you are at an increased risk for chlamydia or gonorrhea. Ask your health care provider if you are at risk.  If you do not have HIV, but are at risk, it may be recommended that you take a prescription medicine daily to prevent HIV infection. This is called pre-exposure prophylaxis (PrEP). You are considered at risk  if:  You are sexually active and do not regularly use condoms or know the HIV status of your partner(s).  You take drugs by injection.  You are sexually active with a partner who has HIV. Talk with your health care provider about whether you are at high risk of being infected with HIV. If you choose to begin PrEP, you should first be tested for HIV. You should then be tested every 3 months for as long as you are taking  PrEP. Pregnancy  If you are premenopausal and you may become pregnant, ask your health care provider about preconception counseling.  If you may become pregnant, take 400 to 800 micrograms (mcg) of folic acid every day.  If you want to prevent pregnancy, talk to your health care provider about birth control (contraception). Osteoporosis and menopause  Osteoporosis is a disease in which the bones lose minerals and strength with aging. This can result in serious bone fractures. Your risk for osteoporosis can be identified using a bone density scan.  If you are 30 years of age or older, or if you are at risk for osteoporosis and fractures, ask your health care provider if you should be screened.  Ask your health care provider whether you should take a calcium or vitamin D supplement to lower your risk for osteoporosis.  Menopause may have certain physical symptoms and risks.  Hormone replacement therapy may reduce some of these symptoms and risks. Talk to your health care provider about whether hormone replacement therapy is right for you. Follow these instructions at home:  Schedule regular health, dental, and eye exams.  Stay current with your immunizations.  Do not use any tobacco products including cigarettes, chewing tobacco, or electronic cigarettes.  If you are pregnant, do not drink alcohol.  If you are breastfeeding, limit how much and how often you drink alcohol.  Limit alcohol intake to no more than 1 drink per day for nonpregnant women. One drink equals  12 ounces of beer, 5 ounces of wine, or 1 ounces of hard liquor.  Do not use street drugs.  Do not share needles.  Ask your health care provider for help if you need support or information about quitting drugs.  Tell your health care provider if you often feel depressed.  Tell your health care provider if you have ever been abused or do not feel safe at home. This information is not intended to replace advice given to you by your health care provider. Make sure you discuss any questions you have with your health care provider. Document Released: 07/12/2010 Document Revised: 06/04/2015 Document Reviewed: 09/30/2014 Elsevier Interactive Patient Education  2017 [employer].

## 2016-06-08 ENCOUNTER — Encounter: Payer: Self-pay | Admitting: Internal Medicine

## 2016-06-08 DIAGNOSIS — Z803 Family history of malignant neoplasm of breast: Secondary | ICD-10-CM | POA: Insufficient documentation

## 2016-06-08 DIAGNOSIS — E669 Obesity, unspecified: Secondary | ICD-10-CM | POA: Insufficient documentation

## 2016-06-08 NOTE — Assessment & Plan Note (Signed)
Fasting glucose of 125.  a1c low enough to delay need for medication.  lowGI diet,  Exercise, and weight loss advised.   Lab Results  Component Value Date   HGBA1C 6.3 06/03/2016

## 2016-06-08 NOTE — Assessment & Plan Note (Signed)
Managed with wellbutrin a.. Se has discontinued prometrium 21 days/month due to maternal history of PR positive breast cancer

## 2016-06-08 NOTE — Assessment & Plan Note (Signed)
Improved with  recent initiation of phentermine for appetite suppression.  She is tolerating the medication without side effects .  She is taking the medication twice daily in a divided dose and  has lost 12 lbs in the last 3 months and in general is feeling better.  Continue medication,  Refills given for 3 months   Body mass index is 31.72 kg/m.

## 2016-06-08 NOTE — Assessment & Plan Note (Signed)
Well controlled on current regimen. Renal function stable, no changes today.  Lab Results  Component Value Date   CREATININE 0.85 06/03/2016   Lab Results  Component Value Date   NA 137 06/03/2016   K 3.9 06/03/2016   CL 101 06/03/2016   CO2 28 06/03/2016

## 2016-06-08 NOTE — Assessment & Plan Note (Signed)
Annual comprehensive preventive exam was done as well as an evaluation and management of chronic conditions .  During the course of the visit the patient was educated and counseled about appropriate screening and preventive services including :  diabetes screening, lipid analysis with projected  10 year  risk for CAD , nutrition counseling, breast, cervical and colorectal cancer screening, and recommended immunizations.  Printed recommendations for health maintenance screenings was given 

## 2016-06-08 NOTE — Assessment & Plan Note (Signed)
Mother has been diagnosed with PR positive breast cancer.  She has had genetic testing which was reportedly negative for BRCA1 and 2 mutations

## 2016-06-09 ENCOUNTER — Telehealth: Payer: Self-pay

## 2016-06-09 ENCOUNTER — Other Ambulatory Visit: Payer: Self-pay

## 2016-06-09 DIAGNOSIS — R928 Other abnormal and inconclusive findings on diagnostic imaging of breast: Secondary | ICD-10-CM

## 2016-06-09 MED ORDER — METFORMIN HCL ER 500 MG PO TB24: 500.0000 mg | ORAL_TABLET | Freq: Every day | ORAL | 1 refills | Status: DC

## 2016-06-09 NOTE — Telephone Encounter (Signed)
-----   Message from Eustace Pen sent at 06/08/2016  4:15 PM EDT ----- Regarding: Drucilla Chalet, Dr. Derrel Nip entered orders for this pt to have her 6 month diagnostic mammogram, however she entered the wrong one. Can you please enter the correct one for me? It needs to be [id number]. Thank you, Lenna Sciara

## 2016-06-09 NOTE — Telephone Encounter (Signed)
Order has been corrected.  

## 2016-06-09 NOTE — Telephone Encounter (Signed)
Refilled: 11/25/2015 Last OV: 06/07/2016 Next OV: 09/19/2016

## 2016-06-09 NOTE — Telephone Encounter (Signed)
Pharmacy called and stated that we sent rx to the wrong place. Can we please resend to [place], [address]. Please advise, thank you!

## 2016-06-09 NOTE — Telephone Encounter (Signed)
Error - medication already sent

## 2016-06-13 ENCOUNTER — Other Ambulatory Visit: Payer: Self-pay | Admitting: Internal Medicine

## 2016-06-17 ENCOUNTER — Telehealth: Payer: Self-pay

## 2016-06-17 DIAGNOSIS — R928 Other abnormal and inconclusive findings on diagnostic imaging of breast: Secondary | ICD-10-CM

## 2016-06-17 NOTE — Telephone Encounter (Signed)
Order changed.

## 2016-06-20 ENCOUNTER — Ambulatory Visit: Payer: Self-pay | Admitting: Family Medicine

## 2016-06-21 ENCOUNTER — Encounter: Payer: Self-pay | Admitting: Family Medicine

## 2016-06-21 ENCOUNTER — Ambulatory Visit (INDEPENDENT_AMBULATORY_CARE_PROVIDER_SITE_OTHER): Payer: 59 | Admitting: Family Medicine

## 2016-06-21 ENCOUNTER — Ambulatory Visit: Payer: Self-pay

## 2016-06-21 VITALS — BP 108/82 | HR 76 | Ht 69.0 in | Wt 215.0 lb

## 2016-06-21 DIAGNOSIS — G5603 Carpal tunnel syndrome, bilateral upper limbs: Secondary | ICD-10-CM | POA: Diagnosis not present

## 2016-06-21 DIAGNOSIS — M25532 Pain in left wrist: Secondary | ICD-10-CM

## 2016-06-21 DIAGNOSIS — M999 Biomechanical lesion, unspecified: Secondary | ICD-10-CM

## 2016-06-21 DIAGNOSIS — M5412 Radiculopathy, cervical region: Secondary | ICD-10-CM | POA: Diagnosis not present

## 2016-06-21 NOTE — Assessment & Plan Note (Signed)
Decision today to treat with OMT was based on Physical Exam  After verbal consent patient was treated with HVLA, ME, FPR techniques in cervical, thoracic, rib lumbar and sacral areas  Patient tolerated the procedure well with improvement in symptoms  Patient given exercises, stretches and lifestyle modifications  See medications in patient instructions if given  Patient will follow up in 6 weeks 

## 2016-06-21 NOTE — Progress Notes (Signed)
[name] [address], [place] [address] Phone: [phone] Subjective:    CC:, low back pain follow-up, bilateral hand pain follow-up  [id number]  Patricia [name] is a 46 y.o. female coming in with complaint of   Low back seems to be doing relatively well.Patient is on medications seems to be doing relatively better. Patient has been having some dull, throbbing aching sensation. Patient states that there is some tightness. States that when she was on vacation the seem to be doing much better.  Patient is having more of the left hand pain. Known carpal tunnel. Started having worsening symptoms again. Starting to affect daily activities.   EMGs that showed patient did have carpal tunnel.  No real trouble during the day if that is not doing well.   Past Medical History:  Diagnosis Date  . Premature atrial contractions    Echo 04/2014: Normal - EF 55-60%, No RWMA, Normal Vavles & Diastolic Fxn; 48 hr Monitor - Frequent PACs with1 short run of  PAT  . Thyroid disease    thyroid nodules   Past Surgical History:  Procedure Laterality Date  . 48 hour Holter Monitor  05/04/2014   5 PVCs, 359 PACs (some with aberrant conduction) 1 short run of 3 beats PAT, no true arrhythmia  . ABDOMINAL HYSTERECTOMY  Dec 2012   secondary to fibroid, heavy bleeding   . BREAST EXCISIONAL BIOPSY Right 1990   neg  . BREAST LUMPECTOMY Right 1991 or 1992   fibroadenoma  . TONSILLECTOMY  May 2012   [name]  . TRANSTHORACIC ECHOCARDIOGRAM  04/2014   NORMAL.  EF 55-60%, no Regional WMA, Norml Valves, normal diastolic Fxn, normal RV / RVSP   Social History   Social History  . Marital status: Married    Spouse name: N/A  . Number of children: N/A  . Years of education: N/A   Social History Main Topics  . Smoking status: Never Smoker  . Smokeless tobacco: Never Used  . Alcohol use Yes     Comment: occasional  . Drug use: No  . Sexual  activity: Not Asked   Other Topics Concern  . None   Social History Narrative  . None   Allergies  Allergen Reactions  . Clarithromycin   . Tramadol   . Zithromax [Azithromycin]    Family History  Problem Relation Age of Onset  . Diabetes Mother   . Hyperlipidemia Mother   . Hypertension Mother   . Breast cancer Mother 30       invasive mammary carcinoma  . Alcohol abuse Father   . Mental illness Father   . Mitral valve prolapse Father   . COPD Father   . Heart failure Father   . Diabetes Brother   . Cancer Paternal Grandmother        bladder  . Breast cancer Paternal Grandmother   . Breast cancer Other 61       maternal great aunt    Past medical history, social, surgical and family history all reviewed in electronic medical record.  No pertanent information unless stated regarding to the chief complaint.   Review of Systems: No headache, visual changes, nausea, vomiting, diarrhea, constipation, dizziness, abdominal pain, skin rash, fevers, chills, night sweats, weight loss, swollen lymph nodes, body aches, joint swelling, muscle aches, chest pain, shortness of breath, mood changes.    Objective  Blood pressure 108/82, pulse 76, height 5\' 9"  (1.753 m), weight 215 lb (  97.5 kg).  Systems examined below as of 06/21/16 General: NAD A&O x3 mood, affect normal  HEENT: Pupils equal, extraocular movements intact no nystagmus Respiratory: not short of breath at rest or with speaking Cardiovascular: No lower extremity edema, non tender Skin: Warm dry intact with no signs of infection or rash on extremities or on axial skeleton. Abdomen: Soft nontender, no masses Neuro: Cranial nerves  intact, neurovascularly intact in all extremities with 2+ DTRs and 2+ pulses. Lymph: No lymphadenopathy appreciated today  Gait normal with good balance and coordination.  MSK: Non tender with full range of motion and good stability and symmetric strength and tone of shoulders, elbows,   knee  hips and ankles bilaterally.   Back Exam:  Inspection: Unremarkable  Motion: Flexion 45 deg, Extension 25 deg, Side Bending to 35 deg bilaterally,  Rotation to 45 deg bilaterally  SLR laying: Negative  XSLR laying: Negative  Palpable tenderness:  Mild tenderness in the upper lumbar juncture as well as the lumbosacral juncture.  FABER: negative. Sensory change: Gross sensation intact to all lumbar and sacral dermatomes.  Reflexes: 2+ at both patellar tendons, 2+ at achilles tendons, Babinski's downgoing.  Strength at foot  Plantar-flexion: 5/5 Dorsi-flexion: 5/5 Eversion: 5/5 Inversion: 5/5  Leg strength  Quad: 5/5 Hamstring: 5/5 Hip flexor: 5/5 Hip abductors: 5/5  Gait unremarkable.  Wrist exam shows the patient does have positive Tinel's and Phalen's on the left wrist. Grip strength is still intact. Neurovascular intact but does have some subjective numbness of the left thumb through middle finger.  Neck: Inspection unremarkable. No palpable stepoffs. Negative Spurling's maneuver. Full neck range of motion Grip strength and sensation normal in bilateral hands Strength good C4 to T1 distribution No sensory change to C4 to T1 Negative Hoffman sign bilaterally Reflexes normal  Osteopathic findings C2 flexed rotated and side bent right C3 flexed rotated and side bent left C6 flexed rotated and side bent left T3 extended rotated and side bent right inhaled third rib T9 extended rotated and side bent left L4 flexed rotated and side bent right Sacrum right on right  Procedure: Real-time Ultrasound Guided Injection of right carpal tunnel Device: GE Logiq Q7 Ultrasound guided injection is preferred based studies that show increased duration, increased effect, greater accuracy, decreased procedural pain, increased response rate with ultrasound guided versus blind injection.  Verbal informed consent obtained.  Time-out conducted.  Noted no overlying erythema, induration, or other  signs of local infection.  Skin prepped in a sterile fashion.  Local anesthesia: Topical Ethyl chloride.  With sterile technique and under real time ultrasound guidance:  median nerve visualized.  23g 5/8 inch needle inserted distal to proximal approach into nerve sheath. Pictures taken nfor needle placement. Patient did have injection of 2 cc of 0.5% Marcaine, and 1 cc of Kenalog 40 mg/dL. Completed without difficulty  Pain immediately resolved suggesting accurate placement of the medication.  Advised to call if fevers/chills, erythema, induration, drainage, or persistent bleeding.  Images permanently stored and available for review in the ultrasound unit.  Impression: Technically successful ultrasound guided injection.   Impression and Recommendations:     This case required medical decision making of moderate complexity.      Note: This dictation was prepared with Dragon dictation along with smaller phrase technology. Any transcriptional errors that result from this process are unintentional.

## 2016-06-21 NOTE — Assessment & Plan Note (Signed)
Worsening symptoms on the left sign. Tone of the procedure well with the injection. We discussed icing regimen. We discussed wearing the brace slowly. Patient will start to increase activity as tolerated. Follow-up again in 6 weeks

## 2016-06-21 NOTE — Patient Instructions (Signed)
injected the wrist  Overall back doing well  I will work on the brace See me again in 6 weeks!

## 2016-06-21 NOTE — Assessment & Plan Note (Signed)
Patient does not have any radicular symptoms. Patient does have low more of a carpal tunnel. Was given an injection. Has been tolerating the osteopathic manipulation well. We discussed icing regimen and home exercises. Patient will make different changes as well as we discussed and the ergonomic fashion. Patient will follow-up again in 4-6 weeks

## 2016-07-10 DIAGNOSIS — S060XAA Concussion with loss of consciousness status unknown, initial encounter: Secondary | ICD-10-CM

## 2016-07-10 DIAGNOSIS — S060X9A Concussion with loss of consciousness of unspecified duration, initial encounter: Secondary | ICD-10-CM

## 2016-07-10 HISTORY — DX: Concussion with loss of consciousness status unknown, initial encounter: S06.0XAA

## 2016-07-10 HISTORY — DX: Concussion with loss of consciousness of unspecified duration, initial encounter: S06.0X9A

## 2016-07-15 ENCOUNTER — Ambulatory Visit
Admission: RE | Admit: 2016-07-15 | Discharge: 2016-07-15 | Disposition: A | Discharge status: Home / Self Care | Payer: 59 | Source: Ambulatory Visit | Attending: Internal Medicine | Admitting: Internal Medicine

## 2016-07-15 DIAGNOSIS — R928 Other abnormal and inconclusive findings on diagnostic imaging of breast: Secondary | ICD-10-CM | POA: Insufficient documentation

## 2016-07-15 DIAGNOSIS — R922 Inconclusive mammogram: Secondary | ICD-10-CM | POA: Diagnosis not present

## 2016-07-18 ENCOUNTER — Other Ambulatory Visit: Payer: Self-pay | Admitting: Internal Medicine

## 2016-07-18 ENCOUNTER — Ambulatory Visit: Payer: Self-pay | Admitting: Family

## 2016-07-18 VITALS — BP 125/70 | HR 85 | Temp 98.5°F | Resp 16

## 2016-07-18 DIAGNOSIS — H6690 Otitis media, unspecified, unspecified ear: Secondary | ICD-10-CM | POA: Insufficient documentation

## 2016-07-18 DIAGNOSIS — H6692 Otitis media, unspecified, left ear: Secondary | ICD-10-CM

## 2016-07-18 MED ORDER — CEFDINIR 300 MG PO CAPS: 300.0000 mg | ORAL_CAPSULE | Freq: Two times a day (BID) | ORAL | 0 refills | Status: DC

## 2016-07-18 MED ORDER — FLUCONAZOLE 150 MG PO TABS: 150.0000 mg | ORAL_TABLET | Freq: Once | ORAL | 0 refills | Status: AC

## 2016-07-18 NOTE — Progress Notes (Signed)
S/: 2 day history of acute and severe left ear pain, no fever, positive left maxillary sinus pain and pressure, chronic year-round allergies, tonsillectomy 6 years ago for recurrent strep throats and sinus infections, has been symptom-free until now  O/: Vital signs stable alert pleasant appears uncomfortable ENT left TM with loss of landmarks hyperemic right TM normal, nasal turbinates erythematous and swollen left maxillary tenderness to palpation pharynx clear neck supple heart RSR lungs clear  A/left otitis media  P/: Cefdinir  300 mg twice a day #20,Supportive measures discussed. Follow up prn not improving  . Diflucan sent for when necessary use

## 2016-07-19 ENCOUNTER — Encounter: Payer: Self-pay | Admitting: Family Medicine

## 2016-07-22 DIAGNOSIS — Z041 Encounter for examination and observation following transport accident: Secondary | ICD-10-CM | POA: Diagnosis not present

## 2016-07-22 DIAGNOSIS — Y92410 Unspecified street and highway as the place of occurrence of the external cause: Secondary | ICD-10-CM | POA: Diagnosis not present

## 2016-07-22 DIAGNOSIS — R739 Hyperglycemia, unspecified: Secondary | ICD-10-CM | POA: Diagnosis not present

## 2016-07-22 DIAGNOSIS — R319 Hematuria, unspecified: Secondary | ICD-10-CM | POA: Diagnosis not present

## 2016-07-22 DIAGNOSIS — S81022A Laceration with foreign body, left knee, initial encounter: Secondary | ICD-10-CM | POA: Diagnosis not present

## 2016-07-22 DIAGNOSIS — S51021A Laceration with foreign body of right elbow, initial encounter: Secondary | ICD-10-CM | POA: Diagnosis not present

## 2016-07-22 DIAGNOSIS — M25462 Effusion, left knee: Secondary | ICD-10-CM | POA: Diagnosis not present

## 2016-07-22 DIAGNOSIS — Y999 Unspecified external cause status: Secondary | ICD-10-CM | POA: Diagnosis not present

## 2016-07-22 DIAGNOSIS — S51821A Laceration with foreign body of right forearm, initial encounter: Secondary | ICD-10-CM | POA: Diagnosis not present

## 2016-07-22 DIAGNOSIS — S81012A Laceration without foreign body, left knee, initial encounter: Secondary | ICD-10-CM | POA: Diagnosis not present

## 2016-07-22 DIAGNOSIS — D72829 Elevated white blood cell count, unspecified: Secondary | ICD-10-CM | POA: Diagnosis not present

## 2016-07-25 ENCOUNTER — Telehealth: Payer: Self-pay | Admitting: Internal Medicine

## 2016-07-25 NOTE — Telephone Encounter (Signed)
Going to Employee clinic.
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
## 2016-07-25 NOTE — Telephone Encounter (Signed)
Juliann Pulse spoke to going to [employer]
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
## 2016-07-26 ENCOUNTER — Encounter: Payer: Self-pay | Admitting: Family Medicine

## 2016-07-29 ENCOUNTER — Ambulatory Visit (INDEPENDENT_AMBULATORY_CARE_PROVIDER_SITE_OTHER): Payer: Self-pay | Admitting: Certified Nurse Midwife

## 2016-07-29 ENCOUNTER — Telehealth: Payer: Self-pay | Admitting: Internal Medicine

## 2016-07-29 ENCOUNTER — Other Ambulatory Visit: Payer: Self-pay

## 2016-07-29 ENCOUNTER — Encounter: Payer: Self-pay | Admitting: Certified Nurse Midwife

## 2016-07-29 VITALS — BP 108/82 | HR 76 | Ht 69.0 in | Wt 215.0 lb

## 2016-07-29 DIAGNOSIS — H9203 Otalgia, bilateral: Secondary | ICD-10-CM

## 2016-07-29 DIAGNOSIS — R112 Nausea with vomiting, unspecified: Secondary | ICD-10-CM

## 2016-07-29 DIAGNOSIS — R110 Nausea: Secondary | ICD-10-CM

## 2016-07-29 MED ORDER — PROMETHAZINE HCL 25 MG PO TABS: 25.0000 mg | ORAL_TABLET | Freq: Four times a day (QID) | ORAL | 1 refills | Status: DC | PRN
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
## 2016-07-29 NOTE — Telephone Encounter (Addendum)
Per Amy CMA [phone] Dr Gayla Medicus "feels like she is the fun house" at 1 hour ago they gave her Zofran.  Now she is vomiting.   She has headache, light is bothering her.

## 2016-07-29 NOTE — Telephone Encounter (Signed)
Spoke with [facility] LPN, and she stated patient needs to go to ED to be evaluated.  I  advised Amy ,CMA to have Dr Gayla Medicus go to ED to be evaluated per below symptoms she needs imaging and further work up. I    Advised Amy, CMA that patient is not to drive.

## 2016-08-05 ENCOUNTER — Ambulatory Visit
Admission: RE | Admit: 2016-08-05 | Discharge: 2016-08-05 | Disposition: A | Discharge status: Home / Self Care | Payer: 59 | Source: Ambulatory Visit | Attending: Family Medicine | Admitting: Family Medicine

## 2016-08-05 DIAGNOSIS — M8588 Other specified disorders of bone density and structure, other site: Secondary | ICD-10-CM | POA: Diagnosis not present

## 2016-08-05 DIAGNOSIS — S59901A Unspecified injury of right elbow, initial encounter: Secondary | ICD-10-CM | POA: Diagnosis not present

## 2016-08-05 DIAGNOSIS — M25521 Pain in right elbow: Secondary | ICD-10-CM | POA: Insufficient documentation

## 2016-08-05 DIAGNOSIS — X58XXXA Exposure to other specified factors, initial encounter: Secondary | ICD-10-CM | POA: Insufficient documentation

## 2016-08-05 DIAGNOSIS — M25562 Pain in left knee: Secondary | ICD-10-CM | POA: Diagnosis not present

## 2016-08-05 DIAGNOSIS — S8992XA Unspecified injury of left lower leg, initial encounter: Secondary | ICD-10-CM | POA: Diagnosis not present
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
## 2016-08-05 NOTE — Patient Instructions (Signed)
Ondansetron injection What is this medicine? ONDANSETRON (on DAN se tron) is used to treat nausea and vomiting caused by chemotherapy. It is also used to prevent or treat nausea and vomiting after surgery. This medicine may be used for other purposes; ask your health care provider or pharmacist if you have questions. COMMON BRAND NAME(S): Zofran What should I tell my health care provider before I take this medicine? They need to know if you have any of these conditions: -heart disease -history of irregular heartbeat -liver disease -low levels of magnesium or potassium in the blood -an unusual or allergic reaction to ondansetron, granisetron, other medicines, foods, dyes, or preservatives -pregnant or trying to get pregnant -breast-feeding How should I use this medicine? This medicine is for infusion into a vein. It is given by a health care professional in a hospital or clinic setting. Talk to your pediatrician regarding the use of this medicine in children. Special care may be needed. Overdosage: If you think you have taken too much of this medicine contact a poison control center or emergency room at once. NOTE: This medicine is only for you. Do not share this medicine with others. What if I miss a dose? This does not apply. What may interact with this medicine? Do not take this medicine with any of the following medications: -apomorphine -certain medicines for fungal infections like fluconazole, itraconazole, ketoconazole, posaconazole, voriconazole -cisapride -dofetilide -dronedarone -pimozide -thioridazine -ziprasidone This medicine may also interact with the following medications: -carbamazepine -certain medicines for depression, anxiety, or psychotic disturbances -fentanyl -linezolid -MAOIs like Carbex, Eldepryl, Marplan, Nardil, and Parnate -methylene blue (injected into a vein) -other medicines that prolong the QT interval (cause an abnormal heart  rhythm) -phenytoin -rifampicin -tramadol This list may not describe all possible interactions. Give your health care provider a list of all the medicines, herbs, non-prescription drugs, or dietary supplements you use. Also tell them if you smoke, drink alcohol, or use illegal drugs. Some items may interact with your medicine. What should I watch for while using this medicine? Your condition will be monitored carefully while you are receiving this medicine. What side effects may I notice from receiving this medicine? Side effects that you should report to your doctor or health care professional as soon as possible: -allergic reactions like skin rash, itching or hives, swelling of the face, lips, or tongue -breathing problems -confusion -dizziness -fast or irregular heartbeat -feeling faint or lightheaded, falls -fever and chills -loss of balance or coordination -seizures -sweating -swelling of the hands and feet -tightness in the chest -tremors -unusually weak or tired Side effects that usually do not require medical attention (report to your doctor or health care professional if they continue or are bothersome): -constipation or diarrhea -headache This list may not describe all possible side effects. Call your doctor for medical advice about side effects. You may report side effects to FDA at 1-800-FDA-1088. Where should I keep my medicine? This drug is given in a hospital or clinic and will not be stored at home. NOTE: This sheet is a summary. It may not cover all possible information. If you have questions about this medicine, talk to your doctor, pharmacist, or health care provider.  2018 Elsevier/Gold Standard (2012-10-03 16:18:28) Ceftriaxone injection What is this medicine? CEFTRIAXONE (sef try AX one) is a cephalosporin antibiotic. It is used to treat certain kinds of bacterial infections. It will not work for colds, flu, or other viral infections. This medicine may be used  for other purposes; ask your  health care provider or pharmacist if you have questions. COMMON BRAND NAME(S): Rocephin What should I tell my health care provider before I take this medicine? They need to know if you have any of these conditions: -any chronic illness -bowel disease, like colitis -both kidney and liver disease -high bilirubin level in newborn patients -an unusual or allergic reaction to ceftriaxone, other cephalosporin or penicillin antibiotics, foods, dyes, or preservatives -pregnant or trying to get pregnant -breast-feeding How should I use this medicine? This medicine is injected into a muscle or infused it into a vein. It is usually given in a medical office or clinic. If you are to give this medicine you will be taught how to inject it. Follow instructions carefully. Use your doses at regular intervals. Do not take your medicine more often than directed. Do not skip doses or stop your medicine early even if you feel better. Do not stop taking except on your doctor's advice. Talk to your pediatrician regarding the use of this medicine in children. Special care may be needed. Overdosage: If you think you have taken too much of this medicine contact a poison control center or emergency room at once. NOTE: This medicine is only for you. Do not share this medicine with others. What if I miss a dose? If you miss a dose, take it as soon as you can. If it is almost time for your next dose, take only that dose. Do not take double or extra doses. What may interact with this medicine? Do not take this medicine with any of the following medications: -intravenous calcium This medicine may also interact with the following medications: -birth control pills This list may not describe all possible interactions. Give your health care provider a list of all the medicines, herbs, non-prescription drugs, or dietary supplements you use. Also tell them if you smoke, drink alcohol, or use illegal  drugs. Some items may interact with your medicine. What should I watch for while using this medicine? Tell your doctor or health care professional if your symptoms do not improve or if they get worse. Do not treat diarrhea with over the counter products. Contact your doctor if you have diarrhea that lasts more than 2 days or if it is severe and watery. If you are being treated for a sexually transmitted disease, avoid sexual contact until you have finished your treatment. Having sex can infect your sexual partner. Calcium may bind to this medicine and cause lung or kidney problems. Avoid calcium products while taking this medicine and for 48 hours after taking the last dose of this medicine. What side effects may I notice from receiving this medicine? Side effects that you should report to your doctor or health care professional as soon as possible: -allergic reactions like skin rash, itching or hives, swelling of the face, lips, or tongue -breathing problems -fever, chills -irregular heartbeat -pain when passing urine -seizures -stomach pain, cramps -unusual bleeding, bruising -unusually weak or tired Side effects that usually do not require medical attention (report to your doctor or health care professional if they continue or are bothersome): -diarrhea -dizzy, drowsy -headache -nausea, vomiting -pain, swelling, irritation where injected -stomach upset -sweating This list may not describe all possible side effects. Call your doctor for medical advice about side effects. You may report side effects to FDA at 1-800-FDA-1088. Where should I keep my medicine? Keep out of the reach of children. Store at room temperature below 25 degrees C (77 degrees F). Protect from light. Throw  away any unused vials after the expiration date. NOTE: This sheet is a summary. It may not cover all possible information. If you have questions about this medicine, talk to your doctor, pharmacist, or health care  provider.  2018 Elsevier/Gold Standard (2013-07-15 09:14:54)
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
## 2016-08-08 ENCOUNTER — Encounter: Payer: Self-pay | Admitting: *Deleted

## 2016-08-08 ENCOUNTER — Encounter: Payer: Self-pay | Admitting: Family Medicine

## 2016-08-08 ENCOUNTER — Ambulatory Visit (INDEPENDENT_AMBULATORY_CARE_PROVIDER_SITE_OTHER): Payer: 59 | Admitting: Family Medicine

## 2016-08-08 ENCOUNTER — Ambulatory Visit: Payer: Self-pay

## 2016-08-08 VITALS — BP 118/78 | HR 73 | Ht 69.0 in | Wt 210.0 lb

## 2016-08-08 DIAGNOSIS — M24021 Loose body in right elbow: Secondary | ICD-10-CM | POA: Insufficient documentation

## 2016-08-08 DIAGNOSIS — M25511 Pain in right shoulder: Secondary | ICD-10-CM

## 2016-08-08 DIAGNOSIS — S060X0A Concussion without loss of consciousness, initial encounter: Secondary | ICD-10-CM | POA: Diagnosis not present

## 2016-08-08 DIAGNOSIS — Z87820 Personal history of traumatic brain injury: Secondary | ICD-10-CM | POA: Insufficient documentation

## 2016-08-08 DIAGNOSIS — S86892A Other injury of other muscle(s) and tendon(s) at lower leg level, left leg, initial encounter: Secondary | ICD-10-CM | POA: Diagnosis not present

## 2016-08-08 MED ORDER — DOXYCYCLINE HYCLATE 100 MG PO TABS: 100.0000 mg | ORAL_TABLET | Freq: Two times a day (BID) | ORAL | 0 refills | Status: AC

## 2016-08-08 NOTE — Assessment & Plan Note (Signed)
Partial tear noted. In addition of this patient does have overlying erythema in the area that on ultrasound is consistent with potential infection. Discussed with patient about doxycycline for further evaluation as well as coverage. We discussed that we will need to monitor. Patient does not make any improvement we need to consider the possibility of foreign body still layers well. We discussed trying some mild range of motion. Patient will come back and see me again in 4 weeks

## 2016-08-08 NOTE — Assessment & Plan Note (Signed)
Mild concussion will monitor, discussed OTC meds, edecrease work.

## 2016-08-08 NOTE — Patient Instructions (Addendum)
Good to see you  I am sorry you have had a rough year.  We will back you up at work  Patricia [name] is your friend.  Ice 20 minutes 2 times daily. Usually after activity and before bed. The knee should heal and the elbow we will watch  AC separation will do well with pennsaid For the concussion  To help improve COGNITIVE function: Using fish oil/omega 3 that is 1000 mg (or roughly 600 mg EPA/DHA), starting as soon as possible after concussion, take: 3 tabs THREE TIMES a day  for the first 3 days, then (you will smell a little, sory) 3 tabs TWICE DAILY  for the next 3 days, then 3 tabs ONCE DAILY  for the next 10 days   To help reduce HEADACHES: Coenzyme Q10 160mg  ONCE DAILY  I want to see you again in 2 weeks (OK to double book)

## 2016-08-08 NOTE — Assessment & Plan Note (Signed)
Patient is his body seems to be the soft tissue. This could be unfortunately glass or possibly some other foreign objects. Patient has decided that she would like to see how it does. Given antibiotics to further heal with the erythema in the area. No significant improvement we may need to consider surgical intervention. Patient will try compression and avoiding direct contact in this area.

## 2016-08-16 ENCOUNTER — Encounter: Payer: Self-pay | Admitting: Family Medicine

## 2016-08-23 ENCOUNTER — Ambulatory Visit (INDEPENDENT_AMBULATORY_CARE_PROVIDER_SITE_OTHER): Payer: 59 | Admitting: Family Medicine

## 2016-08-23 ENCOUNTER — Ambulatory Visit: Payer: Self-pay

## 2016-08-23 ENCOUNTER — Encounter: Payer: Self-pay | Admitting: Family Medicine

## 2016-08-23 VITALS — BP 110/76 | HR 82 | Ht 69.0 in | Wt 208.0 lb

## 2016-08-23 DIAGNOSIS — M795 Residual foreign body in soft tissue: Secondary | ICD-10-CM

## 2016-08-23 DIAGNOSIS — S86892A Other injury of other muscle(s) and tendon(s) at lower leg level, left leg, initial encounter: Secondary | ICD-10-CM

## 2016-08-23 DIAGNOSIS — M24021 Loose body in right elbow: Secondary | ICD-10-CM | POA: Diagnosis not present

## 2016-08-23 DIAGNOSIS — M25521 Pain in right elbow: Secondary | ICD-10-CM | POA: Diagnosis not present

## 2016-08-23 DIAGNOSIS — S060X0D Concussion without loss of consciousness, subsequent encounter: Secondary | ICD-10-CM | POA: Diagnosis not present
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
## 2016-08-23 NOTE — Patient Instructions (Addendum)
We will see you  Ice is your friend. Ice 20 minutes 2 times daily. Usually after activity and before bed.  We will get MRI of knee.  Ask aflac again to send what we need.  Continue the fish oil and maybe have eye check again  We will see you again in 3-4 weeks for another round of manipualtion  OK to lift a little and start a bike at very low resistance

## 2016-08-24 NOTE — Assessment & Plan Note (Signed)
Resolved at this time except for vision. Discussed the patient may want to have her checked again. I did not feel that advance imaging is warranted based on testing today.

## 2016-08-24 NOTE — Assessment & Plan Note (Signed)
Concerned than patient does have a partial-thickness tear that does not seem to be improving. Also possible loose bodies and fragmentation. Did have significant amount ankle as at the time of injury. I do feel that an MRI is necessary at this time to further evaluate. Patient is in agreement with this. We'll have the MRI and then discuss different options that would be available.

## 2016-08-24 NOTE — Assessment & Plan Note (Signed)
Improvement at this time. Likely clinically reabsorbed just fine. We discussed icing regimen, we discussed which activities to do in which ones to avoid. Patient will continue to monitor if any worsening symptoms we'll consider advanced imaging but I think patient will do well with conservative therapy.

## 2016-08-30 ENCOUNTER — Other Ambulatory Visit: Payer: Self-pay | Admitting: Family Medicine

## 2016-08-30 NOTE — Telephone Encounter (Signed)
Refill done.  

## 2016-09-05 ENCOUNTER — Ambulatory Visit
Admission: RE | Admit: 2016-09-05 | Discharge: 2016-09-05 | Disposition: A | Discharge status: Home / Self Care | Payer: 59 | Source: Ambulatory Visit | Attending: Family Medicine | Admitting: Family Medicine

## 2016-09-05 ENCOUNTER — Encounter: Payer: Self-pay | Admitting: Family Medicine

## 2016-09-05 DIAGNOSIS — S46912A Strain of unspecified muscle, fascia and tendon at shoulder and upper arm level, left arm, initial encounter: Secondary | ICD-10-CM | POA: Diagnosis not present

## 2016-09-05 DIAGNOSIS — S86892A Other injury of other muscle(s) and tendon(s) at lower leg level, left leg, initial encounter: Secondary | ICD-10-CM

## 2016-09-05 DIAGNOSIS — M795 Residual foreign body in soft tissue: Secondary | ICD-10-CM

## 2016-09-06 ENCOUNTER — Telehealth: Payer: Self-pay | Admitting: Radiology

## 2016-09-06 DIAGNOSIS — I10 Essential (primary) hypertension: Secondary | ICD-10-CM

## 2016-09-06 DIAGNOSIS — E079 Disorder of thyroid, unspecified: Secondary | ICD-10-CM

## 2016-09-06 DIAGNOSIS — E119 Type 2 diabetes mellitus without complications: Secondary | ICD-10-CM

## 2016-09-06 NOTE — Telephone Encounter (Signed)
Pt coming in for labs next Thursday please place future orders. Thank you.

## 2016-09-07 ENCOUNTER — Other Ambulatory Visit: Payer: Self-pay | Admitting: Internal Medicine

## 2016-09-07 NOTE — Telephone Encounter (Signed)
ordered

## 2016-09-07 NOTE — Telephone Encounter (Signed)
Pt called checking on this referral. I do not see it in her Referral Tab. Can you put this in?
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
## 2016-09-07 NOTE — Addendum Note (Signed)
Addended by: [name] on: 09/07/2016 10:35 AM   Modules accepted: Orders

## 2016-09-07 NOTE — Telephone Encounter (Signed)
REFILLED BOTH

## 2016-09-15 ENCOUNTER — Other Ambulatory Visit (INDEPENDENT_AMBULATORY_CARE_PROVIDER_SITE_OTHER): Payer: 59

## 2016-09-15 DIAGNOSIS — E119 Type 2 diabetes mellitus without complications: Secondary | ICD-10-CM

## 2016-09-15 DIAGNOSIS — E079 Disorder of thyroid, unspecified: Secondary | ICD-10-CM

## 2016-09-15 LAB — COMPREHENSIVE METABOLIC PANEL
ALT: 12 U/L (ref 0–35)
AST: 13 U/L (ref 0–37)
Albumin: 3.5 g/dL (ref 3.5–5.2)
Alkaline Phosphatase: 62 U/L (ref 39–117)
BUN: 12 mg/dL (ref 6–23)
CO2: 30 mEq/L (ref 19–32)
Calcium: 8.9 mg/dL (ref 8.4–10.5)
Chloride: 100 mEq/L (ref 96–112)
Creatinine, Ser: 0.69 mg/dL (ref 0.40–1.20)
GFR: 96.98 mL/min (ref >60.00)
Glucose, Bld: 109 mg/dL — ABNORMAL HIGH (ref 70–99)
Potassium: 3.9 mEq/L (ref 3.5–5.1)
Sodium: 138 mEq/L (ref 135–145)
Total Bilirubin: 0.4 mg/dL (ref 0.2–1.2)
Total Protein: 6.7 g/dL (ref 6.0–8.3)

## 2016-09-15 LAB — HEMOGLOBIN A1C: Hgb A1c MFr Bld: 6.2 % (ref 4.6–6.5)

## 2016-09-15 LAB — LIPID PANEL
Cholesterol: 172 mg/dL (ref 0–200)
HDL: 43.1 mg/dL (ref >39.00)
LDL Cholesterol: 105 mg/dL — ABNORMAL HIGH (ref 0–99)
NonHDL: 129.28
Total CHOL/HDL Ratio: 4
Triglycerides: 121 mg/dL (ref 0.0–149.0)
VLDL: 24.2 mg/dL (ref 0.0–40.0)

## 2016-09-15 LAB — TSH: TSH: 1.64 u[IU]/mL (ref 0.35–4.50)

## 2016-09-16 DIAGNOSIS — M25562 Pain in left knee: Secondary | ICD-10-CM | POA: Diagnosis not present

## 2016-09-16 DIAGNOSIS — M7541 Impingement syndrome of right shoulder: Secondary | ICD-10-CM | POA: Diagnosis not present

## 2016-09-19 ENCOUNTER — Ambulatory Visit (INDEPENDENT_AMBULATORY_CARE_PROVIDER_SITE_OTHER): Payer: 59 | Admitting: Internal Medicine

## 2016-09-19 ENCOUNTER — Ambulatory Visit: Payer: Self-pay | Admitting: Family Medicine

## 2016-09-19 ENCOUNTER — Encounter: Payer: Self-pay | Admitting: Internal Medicine

## 2016-09-19 DIAGNOSIS — Z79899 Other long term (current) drug therapy: Secondary | ICD-10-CM | POA: Diagnosis not present

## 2016-09-19 DIAGNOSIS — F321 Major depressive disorder, single episode, moderate: Secondary | ICD-10-CM | POA: Diagnosis not present

## 2016-09-19 DIAGNOSIS — F5102 Adjustment insomnia: Secondary | ICD-10-CM

## 2016-09-19 DIAGNOSIS — E669 Obesity, unspecified: Secondary | ICD-10-CM | POA: Diagnosis not present

## 2016-09-19 DIAGNOSIS — S060X0D Concussion without loss of consciousness, subsequent encounter: Secondary | ICD-10-CM

## 2016-09-19 DIAGNOSIS — E119 Type 2 diabetes mellitus without complications: Secondary | ICD-10-CM | POA: Diagnosis not present

## 2016-09-19 MED ORDER — ZOLPIDEM TARTRATE 10 MG PO TABS: 10.0000 mg | ORAL_TABLET | Freq: Every evening | ORAL | 5 refills | Status: DC | PRN

## 2016-09-19 MED ORDER — PEN NEEDLES 31G X 6 MM MISC: 1 refills | Status: DC

## 2016-09-19 MED ORDER — LIRAGLUTIDE 18 MG/3ML ~~LOC~~ SOPN: PEN_INJECTOR | SUBCUTANEOUS | 2 refills | Status: DC
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
## 2016-09-19 NOTE — Patient Instructions (Signed)
Dc phentermine when rx for victoza gets approved.  Start with 0.6 mg daily ,  Advance to 1.2 mg after one week if nausea resolved.  Advance to 1.8 mg after 2 weeks    We should Consider adding cymbalta for depression /pain management in a few weeks   We also discussed a Trial of trazodone down the road for insomnia if cymbalta does not help with insomnia

## 2016-09-20 ENCOUNTER — Other Ambulatory Visit: Payer: Self-pay | Admitting: Internal Medicine

## 2016-09-20 DIAGNOSIS — F5102 Adjustment insomnia: Secondary | ICD-10-CM | POA: Insufficient documentation

## 2016-09-20 NOTE — Assessment & Plan Note (Signed)
Controlled with metformin.  Adding victoza   Lab Results  Component Value Date   HGBA1C 6.2 09/15/2016
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
## 2016-09-20 NOTE — Assessment & Plan Note (Signed)
Not tolerating phentermine due to worsening insomnia.  Discussed transitioning to Victoza for concurrent management of type 2 diabetes

## 2016-09-20 NOTE — Assessment & Plan Note (Addendum)
Despite use of ambien prn Will consider adding trazodone

## 2016-09-27 ENCOUNTER — Ambulatory Visit: Payer: 59 | Attending: Orthopaedic Surgery | Admitting: Physical Therapy

## 2016-09-27 ENCOUNTER — Encounter: Payer: Self-pay | Admitting: Physical Therapy

## 2016-09-27 DIAGNOSIS — M25562 Pain in left knee: Secondary | ICD-10-CM | POA: Insufficient documentation

## 2016-09-27 DIAGNOSIS — M6281 Muscle weakness (generalized): Secondary | ICD-10-CM | POA: Insufficient documentation

## 2016-09-27 DIAGNOSIS — M25511 Pain in right shoulder: Secondary | ICD-10-CM | POA: Diagnosis not present

## 2016-09-27 NOTE — Patient Instructions (Addendum)
   Copyright  VHI. All rights reserved.  Closed Chain: Shoulder Extensor Stretch - Counter Level    Hands forward on counter, step backward. Stepping backward causes shoulder flexion and stretches shoulder extensors. Hold _30__ seconds. Step backward _2__ times, each foot, __3-5_ times per day.  http://ss.exer.us/255   Copyright  VHI. All rights reserved.  Table Stretch: External Rotation    Sit with left arm on table. Lean forward until a stretch is felt in shoulder. Hold __30__ seconds. Repeat __2__ times. Do __3-5__ sessions per day.  http://[email or web address].exer.us/106   Copyright  VHI. All rights reserved.  (Home) Retraction: Row - Bilateral (Anchor)    Facing anchor, arms reaching forward, pull hands toward stomach, pinching shoulder blades together. Repeat ___15_ times per set. Do _2___ sets per session. Do __7__ sessions per week. Use                 theraband   Copyright  VHI. All rights reserved.    Perform heel slides as instructed 2 x 10 every day in pain free range of motion.
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
## 2016-10-03 ENCOUNTER — Ambulatory Visit: Payer: 59 | Admitting: Physical Therapy

## 2016-10-03 ENCOUNTER — Encounter: Payer: Self-pay | Admitting: Physical Therapy

## 2016-10-03 DIAGNOSIS — M25562 Pain in left knee: Secondary | ICD-10-CM

## 2016-10-03 DIAGNOSIS — M6281 Muscle weakness (generalized): Secondary | ICD-10-CM

## 2016-10-03 DIAGNOSIS — M25511 Pain in right shoulder: Secondary | ICD-10-CM
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
## 2016-10-03 NOTE — Patient Instructions (Addendum)
Short [employer] a large can or rolled towel under left leg. Straighten leg. Hold ____ seconds. Repeat __15__ times. Do ___2_ sessions per day.  http://gt2.exer.us/299   Copyright  VHI. All rights reserved.  HIP: Flexion / KNEE: Extension, Straight Leg Raise    Raise leg, keeping knee straight, have left foot turned out for increased strengthening of inner thigh. Perform slowly. _10-15__ reps per set, 2___ sets per day, __5_ days per week   Copyright  VHI. All rights reserved.  Knee Extension: Terminal - Standing (Single Leg)    Standing with green band around left knee (have band through the door), stand with right leg behind left knee in a lunge type position; slowly straighten left knee and then relax.  Repeat _15_ times per set. Repeat with other leg. Do 2__ sets per session. Do _5_ sessions per week. Anchor Height: Knee  http://tub.exer.us/36   Copyright  VHI. All rights reserved.

## 2016-10-05 ENCOUNTER — Ambulatory Visit: Payer: 59

## 2016-10-05 NOTE — Telephone Encounter (Signed)
Error

## 2016-10-10 ENCOUNTER — Encounter: Payer: Self-pay | Admitting: Family Medicine

## 2016-10-10 ENCOUNTER — Ambulatory Visit (INDEPENDENT_AMBULATORY_CARE_PROVIDER_SITE_OTHER): Payer: 59 | Admitting: Family Medicine

## 2016-10-10 ENCOUNTER — Ambulatory Visit: Payer: 59

## 2016-10-10 VITALS — BP 100/82 | HR 84 | Ht 69.0 in | Wt 211.0 lb

## 2016-10-10 DIAGNOSIS — M999 Biomechanical lesion, unspecified: Secondary | ICD-10-CM

## 2016-10-10 DIAGNOSIS — S86892A Other injury of other muscle(s) and tendon(s) at lower leg level, left leg, initial encounter: Secondary | ICD-10-CM | POA: Diagnosis not present

## 2016-10-10 MED ORDER — DULOXETINE HCL 20 MG PO CPEP: 20.0000 mg | ORAL_CAPSULE | Freq: Every day | ORAL | 1 refills | Status: DC

## 2016-10-10 MED ORDER — DULOXETINE HCL 30 MG PO CPEP: 30.0000 mg | ORAL_CAPSULE | Freq: Every day | ORAL | 1 refills | Status: DC

## 2016-10-10 MED ORDER — NITROGLYCERIN 0.2 MG/HR TD PT24: MEDICATED_PATCH | TRANSDERMAL | 1 refills | Status: DC

## 2016-10-10 NOTE — Progress Notes (Signed)
[name] [place], [place] [address] Phone: [phone] Subjective:    I'm seeing this patient by the request  of:    CC: back pain follow up , knee pain f/u   [id number]  Patricia [name] is a 46 y.o. female coming in for follow up for back pain. Her back has been bothering her and see feels that it is time for an adjustment.   Elbow is doing great.  Her left knee continues to bother her. She has been doing physical therapy but states that she only gets one night of 8 hours of sleep a week. She has throbbing pain at night and seems to have pain a few hours after exercising. She has been trying to increase walking at the track. Patient was found to have more of a patellar tendon tear noted at its origin.     Past Medical History:  Diagnosis Date  . Premature atrial contractions    Echo 04/2014: Normal - EF 55-60%, No RWMA, Normal Vavles & Diastolic Fxn; 48 hr Monitor - Frequent PACs with1 short run of  PAT  . Thyroid disease    thyroid nodules   Past Surgical History:  Procedure Laterality Date  . 48 hour Holter Monitor  05/04/2014   5 PVCs, 359 PACs (some with aberrant conduction) 1 short run of 3 beats PAT, no true arrhythmia  . ABDOMINAL HYSTERECTOMY  Dec 2012   secondary to fibroid, heavy bleeding   . BREAST EXCISIONAL BIOPSY Right 1990   neg  . BREAST LUMPECTOMY Right 1991 or 1992   fibroadenoma  . TONSILLECTOMY  May 2012   [name]  . TRANSTHORACIC ECHOCARDIOGRAM  04/2014   NORMAL.  EF 55-60%, no Regional WMA, Norml Valves, normal diastolic Fxn, normal RV / RVSP   Social History   Social History  . Marital status: Married    Spouse name: N/A  . Number of children: N/A  . Years of education: N/A   Social History Main Topics  . Smoking status: Never Smoker  . Smokeless tobacco: Never Used  . Alcohol use Yes     Comment: occasional  . Drug use: No  . Sexual activity: Yes    Birth control/  protection: Surgical   Other Topics Concern  . None   Social History Narrative  . None   Allergies  Allergen Reactions  . Clarithromycin   . Tramadol   . Zithromax [Azithromycin]    Family History  Problem Relation Age of Onset  . Diabetes Mother   . Hyperlipidemia Mother   . Hypertension Mother   . Breast cancer Mother 72       invasive mammary carcinoma  . Alcohol abuse Father   . Mental illness Father   . Mitral valve prolapse Father   . COPD Father   . Heart failure Father   . Diabetes Brother   . Cancer Paternal Grandmother        bladder  . Breast cancer Paternal Grandmother   . Breast cancer Other 30       maternal great aunt     Past medical history, social, surgical and family history all reviewed in electronic medical record.  No pertanent information unless stated regarding to the chief complaint.   Review of Systems:Review of systems updated and as accurate as of 10/10/16  No headache, visual changes, nausea, vomiting, diarrhea, constipation, dizziness, abdominal pain, skin rash, fevers, chills, night sweats, weight loss, swollen lymph  nodes, body aches, joint swelling, chest pain, s hortness of breath, mood changes.  + muscle aches.  Objective  Blood pressure 100/82, pulse 84, height 5\' 9"  (1.753 m), weight 211 lb (95.7 kg), SpO2 98 %. Systems examined below as of 10/10/16   General: No apparent distress alert and oriented x3 mood and affect normal, dressed appropriately.  HEENT: Pupils equal, extraocular movements intact  Respiratory: Patient's speak in full sentences and does not appear short of breath  Cardiovascular: No lower extremity edema, non tender, no erythema  Skin: Warm dry intact with no signs of infection or rash on extremities or on axial skeleton.  Abdomen: Soft nontender  Neuro: Cranial nerves II through XII are intact, neurovascularly intact in all extremities with 2+ DTRs and 2+ pulses.  Lymph: No lymphadenopathy of posterior or  anterior cervical chain or axillae bilaterally.  Gait normal with good balance and coordination.  MSK:  Non tender with full range of motion and good stability and symmetric strength and tone of shoulders, elbows, wrist, hip, and ankles bilaterally.  Knee: Left Patient still has some discoloration and some swelling over the anterior aspect near the proximal aspect of the patella Tender over the patellar tendon proximally ROM full in flexion and extension and lower leg rotation. Mild tightness with full flexion Ligaments with solid consistent endpoints including ACL, PCL, LCL, MCL. Negative Mcmurray's, Apley's, and Thessalonian tests. Non painful patellar compression. Patellar glide without crepitus. Patellar and quadriceps tendons unremarkable. Hamstring and quadriceps strength is normal.  Contralateral knee unremarkable  Back Exam:  Inspection: Mild loss of lordosis Motion: Flexion 40 deg, Extension 25 deg, Side Bending to 35 deg bilaterally,  Rotation to 35 deg bilaterally  SLR laying: Negative  XSLR laying: Negative  Palpable tenderness: More tenderness in the thoracolumbar. FABER: negative. Sensory change: Gross sensation intact to all lumbar and sacral dermatomes.  Reflexes: 2+ at both patellar tendons, 2+ at achilles tendons, Babinski's downgoing.  Strength at foot  Plantar-flexion: 5/5 Dorsi-flexion: 5/5 Eversion: 5/5 Inversion: 5/5  Leg strength  Quad: 5/5 Hamstring: 5/5 Hip flexor: 5/5 Hip abductors: 4/5  Gait unremarkable.  Osteopathic findings C2 flexed rotated and side bent right C4 flexed rotated and side bent left C7 flexed rotated and side bent left T3 extended rotated and side bent right inhaled third rib T11 extended rotated and side bent left L3 flexed rotated and side bent right Sacrum right on right     Impression and Recommendations:     This case required medical decision making of moderate complexity.      Note: This dictation was prepared  with Dragon dictation along with smaller phrase technology. Any transcriptional errors that result from this process are unintentional.

## 2016-10-10 NOTE — Assessment & Plan Note (Signed)
Still having difficulty.  Starte don nitro  Some more scar tissue noted today.  Once weekly vitaminD  Potential bracing Could be candidate of PRP if continue.

## 2016-10-10 NOTE — Patient Instructions (Addendum)
Good to see you  Ice 20 minutes 2 times daily. Usually after activity and before bed. Stop the wellbutrin and then start cymbalta 30 mg daily  Nitroglycerin Protocol   Apply 1/4 nitroglycerin patch to affected area daily.  Change position of patch within the affected area every 24 hours.  You may experience a headache during the first 1-2 weeks of using the patch, these should subside.  If you experience headaches after beginning nitroglycerin patch treatment, you may take your preferred over the counter pain reliever.  Another side effect of the nitroglycerin patch is skin irritation or rash related to patch adhesive.  Please notify our office if you develop more severe headaches or rash, and stop the patch.  Tendon healing with nitroglycerin patch may require 12 to 24 weeks depending on the extent of injury.  Men should not use if taking Viagra, Cialis, or Levitra.   Do not use if you have migraines or rosacea.  See me again in 4 weeks

## 2016-10-10 NOTE — Assessment & Plan Note (Signed)
Decision today to treat with OMT was based on Physical Exam  After verbal consent patient was treated with HVLA, ME, FPR techniques in cervical, thoracic, lumbar and sacral areas  Patient tolerated the procedure well with improvement in symptoms  Patient given exercises, stretches and lifestyle modifications  See medications in patient instructions if given  Patient will follow up in 4 weeks 

## 2016-10-11 ENCOUNTER — Ambulatory Visit: Payer: 59 | Attending: Orthopaedic Surgery

## 2016-10-11 DIAGNOSIS — M25562 Pain in left knee: Secondary | ICD-10-CM | POA: Diagnosis not present

## 2016-10-11 DIAGNOSIS — M6281 Muscle weakness (generalized): Secondary | ICD-10-CM | POA: Diagnosis not present

## 2016-10-11 DIAGNOSIS — M25511 Pain in right shoulder: Secondary | ICD-10-CM | POA: Insufficient documentation
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
## 2016-10-12 ENCOUNTER — Encounter: Payer: Self-pay | Admitting: Internal Medicine

## 2016-10-12 ENCOUNTER — Other Ambulatory Visit: Payer: Self-pay | Admitting: Internal Medicine

## 2016-10-12 DIAGNOSIS — R110 Nausea: Secondary | ICD-10-CM

## 2016-10-13 ENCOUNTER — Other Ambulatory Visit: Payer: Self-pay | Admitting: Internal Medicine

## 2016-10-13 ENCOUNTER — Telehealth: Payer: Self-pay | Admitting: Internal Medicine

## 2016-10-13 DIAGNOSIS — R112 Nausea with vomiting, unspecified: Secondary | ICD-10-CM | POA: Diagnosis not present

## 2016-10-13 NOTE — Telephone Encounter (Signed)
Faxed lab orders 

## 2016-10-13 NOTE — Telephone Encounter (Signed)
plase fax order for cmet and lipase to her office per [employer]

## 2016-10-14 ENCOUNTER — Ambulatory Visit: Payer: 59

## 2016-10-14 DIAGNOSIS — M25511 Pain in right shoulder: Secondary | ICD-10-CM

## 2016-10-14 DIAGNOSIS — M25562 Pain in left knee: Secondary | ICD-10-CM

## 2016-10-14 DIAGNOSIS — M6281 Muscle weakness (generalized): Secondary | ICD-10-CM

## 2016-10-14 LAB — COMPREHENSIVE METABOLIC PANEL
ALT: 10 IU/L (ref 0–32)
AST: 12 IU/L (ref 0–40)
Albumin/Globulin Ratio: 1.3 (ref 1.2–2.2)
Albumin: 4.1 g/dL (ref 3.5–5.5)
Alkaline Phosphatase: 63 IU/L (ref 39–117)
BUN/Creatinine Ratio: 11 (ref 9–23)
BUN: 10 mg/dL (ref 6–24)
Bilirubin Total: 0.4 mg/dL (ref 0.0–1.2)
CO2: 25 mmol/L (ref 20–29)
Calcium: 9.1 mg/dL (ref 8.7–10.2)
Chloride: 98 mmol/L (ref 96–106)
Creatinine, Ser: 0.92 mg/dL (ref 0.57–1.00)
GFR calc Af Amer: 86 mL/min/{1.73_m2} (ref >59)
GFR calc non Af Amer: 75 mL/min/{1.73_m2} (ref >59)
Globulin, Total: 3.1 g/dL (ref 1.5–4.5)
Glucose: 87 mg/dL (ref 65–99)
Potassium: 3.9 mmol/L (ref 3.5–5.2)
Sodium: 138 mmol/L (ref 134–144)
Total Protein: 7.2 g/dL (ref 6.0–8.5)

## 2016-10-14 LAB — LIPASE: Lipase: 29 U/L (ref 14–72)
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
## 2016-10-17 ENCOUNTER — Ambulatory Visit: Payer: 59

## 2016-10-18 ENCOUNTER — Encounter: Payer: Self-pay | Admitting: Internal Medicine

## 2016-10-19 ENCOUNTER — Other Ambulatory Visit: Payer: Self-pay | Admitting: Internal Medicine

## 2016-10-19 MED ORDER — PHENTERMINE HCL 37.5 MG PO TABS: 37.5000 mg | ORAL_TABLET | Freq: Every day | ORAL | 2 refills | Status: DC

## 2016-10-19 MED ORDER — METFORMIN HCL ER 500 MG PO TB24: 500.0000 mg | ORAL_TABLET | Freq: Every day | ORAL | 1 refills | Status: DC

## 2016-10-20 ENCOUNTER — Encounter: Payer: Self-pay | Admitting: Internal Medicine

## 2016-10-21 ENCOUNTER — Ambulatory Visit: Payer: 59

## 2016-10-21 DIAGNOSIS — M25511 Pain in right shoulder: Secondary | ICD-10-CM

## 2016-10-21 DIAGNOSIS — M25562 Pain in left knee: Secondary | ICD-10-CM | POA: Diagnosis not present

## 2016-10-21 DIAGNOSIS — M6281 Muscle weakness (generalized): Secondary | ICD-10-CM
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
## 2016-10-24 ENCOUNTER — Ambulatory Visit: Payer: 59 | Admitting: Physical Therapy

## 2016-10-24 ENCOUNTER — Encounter: Payer: Self-pay | Admitting: Physical Therapy

## 2016-10-24 DIAGNOSIS — M6281 Muscle weakness (generalized): Secondary | ICD-10-CM

## 2016-10-24 DIAGNOSIS — M25511 Pain in right shoulder: Secondary | ICD-10-CM

## 2016-10-24 DIAGNOSIS — M25562 Pain in left knee: Secondary | ICD-10-CM | POA: Diagnosis not present
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
## 2016-10-28 ENCOUNTER — Ambulatory Visit: Payer: 59

## 2016-10-31 ENCOUNTER — Ambulatory Visit: Payer: 59

## 2016-11-03 ENCOUNTER — Encounter: Payer: Self-pay | Admitting: Internal Medicine

## 2016-11-04 ENCOUNTER — Ambulatory Visit: Payer: 59

## 2016-11-04 MED ORDER — METFORMIN HCL ER (MOD) 1000 MG PO TB24: 1000.0000 mg | ORAL_TABLET | Freq: Every day | ORAL | 1 refills | Status: DC

## 2016-11-06 NOTE — Progress Notes (Deleted)
[name] [place], [place] [address] Phone: [phone] Subjective:    I'm seeing this patient by the request  of:    CC: back pain follow up , knee pain f/u   [id number]  Patricia [name] is a 46 y.o. female coming in for follow up for back pain. Her back has been bothering her and see feels that it is time for an adjustment.   Elbow is doing great.  Her left knee continues to bother her. She has been doing physical therapy but states that she only gets one night of 8 hours of sleep a week. She has throbbing pain at night and seems to have pain a few hours after exercising. She has been trying to increase walking at the track. Patient was found to have more of a patellar tendon tear noted at its origin.     Past Medical History:  Diagnosis Date  . Premature atrial contractions    Echo 04/2014: Normal - EF 55-60%, No RWMA, Normal Vavles & Diastolic Fxn; 48 hr Monitor - Frequent PACs with1 short run of  PAT  . Thyroid disease    thyroid nodules   Past Surgical History:  Procedure Laterality Date  . 48 hour Holter Monitor  05/04/2014   5 PVCs, 359 PACs (some with aberrant conduction) 1 short run of 3 beats PAT, no true arrhythmia  . ABDOMINAL HYSTERECTOMY  Dec 2012   secondary to fibroid, heavy bleeding   . BREAST EXCISIONAL BIOPSY Right 1990   neg  . BREAST LUMPECTOMY Right 1991 or 1992   fibroadenoma  . TONSILLECTOMY  May 2012   [name]  . TRANSTHORACIC ECHOCARDIOGRAM  04/2014   NORMAL.  EF 55-60%, no Regional WMA, Norml Valves, normal diastolic Fxn, normal RV / RVSP   Social History   Social History  . Marital status: Married    Spouse name: N/A  . Number of children: N/A  . Years of education: N/A   Social History Main Topics  . Smoking status: Never Smoker  . Smokeless tobacco: Never Used  . Alcohol use Yes     Comment: occasional  . Drug use: No  . Sexual activity: Yes    Birth control/  protection: Surgical   Other Topics Concern  . Not on file   Social History Narrative  . No narrative on file   Allergies  Allergen Reactions  . Clarithromycin   . Tramadol   . Zithromax [Azithromycin]    Family History  Problem Relation Age of Onset  . Diabetes Mother   . Hyperlipidemia Mother   . Hypertension Mother   . Breast cancer Mother 26       invasive mammary carcinoma  . Alcohol abuse Father   . Mental illness Father   . Mitral valve prolapse Father   . COPD Father   . Heart failure Father   . Diabetes Brother   . Cancer Paternal Grandmother        bladder  . Breast cancer Paternal Grandmother   . Breast cancer Other 40       maternal great aunt     Past medical history, social, surgical and family history all reviewed in electronic medical record.  No pertanent information unless stated regarding to the chief complaint.   Review of Systems:Review of systems updated and as accurate as of 11/06/16  No headache, visual changes, nausea, vomiting, diarrhea, constipation, dizziness, abdominal pain, skin rash, fevers, chills, night  sweats, weight loss, swollen lymph nodes, body aches, joint swelling, chest pain, s hortness of breath, mood changes.  + muscle aches.  Objective  There were no vitals taken for this visit. Systems examined below as of 11/06/16   General: No apparent distress alert and oriented x3 mood and affect normal, dressed appropriately.  HEENT: Pupils equal, extraocular movements intact  Respiratory: Patient's speak in full sentences and does not appear short of breath  Cardiovascular: No lower extremity edema, non tender, no erythema  Skin: Warm dry intact with no signs of infection or rash on extremities or on axial skeleton.  Abdomen: Soft nontender  Neuro: Cranial nerves II through XII are intact, neurovascularly intact in all extremities with 2+ DTRs and 2+ pulses.  Lymph: No lymphadenopathy of posterior or anterior cervical chain or  axillae bilaterally.  Gait normal with good balance and coordination.  MSK:  Non tender with full range of motion and good stability and symmetric strength and tone of shoulders, elbows, wrist, hip, and ankles bilaterally.  Knee: Left Patient still has some discoloration and some swelling over the anterior aspect near the proximal aspect of the patella Tender over the patellar tendon proximally ROM full in flexion and extension and lower leg rotation. Mild tightness with full flexion Ligaments with solid consistent endpoints including ACL, PCL, LCL, MCL. Negative Mcmurray's, Apley's, and Thessalonian tests. Non painful patellar compression. Patellar glide without crepitus. Patellar and quadriceps tendons unremarkable. Hamstring and quadriceps strength is normal.  Contralateral knee unremarkable  Back Exam:  Inspection: Mild loss of lordosis Motion: Flexion 40 deg, Extension 25 deg, Side Bending to 35 deg bilaterally,  Rotation to 35 deg bilaterally  SLR laying: Negative  XSLR laying: Negative  Palpable tenderness: More tenderness in the thoracolumbar. FABER: negative. Sensory change: Gross sensation intact to all lumbar and sacral dermatomes.  Reflexes: 2+ at both patellar tendons, 2+ at achilles tendons, Babinski's downgoing.  Strength at foot  Plantar-flexion: 5/5 Dorsi-flexion: 5/5 Eversion: 5/5 Inversion: 5/5  Leg strength  Quad: 5/5 Hamstring: 5/5 Hip flexor: 5/5 Hip abductors: 4/5  Gait unremarkable.  Osteopathic findings C2 flexed rotated and side bent right C4 flexed rotated and side bent left C7 flexed rotated and side bent left T3 extended rotated and side bent right inhaled third rib T11 extended rotated and side bent left L3 flexed rotated and side bent right Sacrum right on right     Impression and Recommendations:     This case required medical decision making of moderate complexity.      Note: This dictation was prepared with Dragon dictation along  with smaller phrase technology. Any transcriptional errors that result from this process are unintentional.

## 2016-11-07 ENCOUNTER — Ambulatory Visit: Payer: Self-pay | Admitting: Family Medicine

## 2016-12-04 NOTE — Progress Notes (Deleted)
[name] [place], [place] [address] Phone: [phone] Subjective:    I'm seeing this patient by the request  of:    CC:   [id number]  Patricia [name] is a 46 y.o. female coming in with complaint of ***  Onset-  Location Duration-  Character- Aggravating factors- Reliving factors-  Therapies tried-  Severity-     Past Medical History:  Diagnosis Date  . Premature atrial contractions    Echo 04/2014: Normal - EF 55-60%, No RWMA, Normal Vavles & Diastolic Fxn; 48 hr Monitor - Frequent PACs with1 short run of  PAT  . Thyroid disease    thyroid nodules   Past Surgical History:  Procedure Laterality Date  . 48 hour Holter Monitor  05/04/2014   5 PVCs, 359 PACs (some with aberrant conduction) 1 short run of 3 beats PAT, no true arrhythmia  . ABDOMINAL HYSTERECTOMY  Dec 2012   secondary to fibroid, heavy bleeding   . BREAST EXCISIONAL BIOPSY Right 1990   neg  . BREAST LUMPECTOMY Right 1991 or 1992   fibroadenoma  . TONSILLECTOMY  May 2012   [name]  . TRANSTHORACIC ECHOCARDIOGRAM  04/2014   NORMAL.  EF 55-60%, no Regional WMA, Norml Valves, normal diastolic Fxn, normal RV / RVSP   Social History   Socioeconomic History  . Marital status: Married    Spouse name: Not on file  . Number of children: Not on file  . Years of education: Not on file  . Highest education level: Not on file  Social Needs  . Financial resource strain: Not on file  . Food insecurity - worry: Not on file  . Food insecurity - inability: Not on file  . Transportation needs - medical: Not on file  . Transportation needs - non-medical: Not on file  Occupational History  . Not on file  Tobacco Use  . Smoking status: Never Smoker  . Smokeless tobacco: Never Used  Substance and Sexual Activity  . Alcohol use: Yes    Comment: occasional  . Drug use: No  . Sexual activity: Yes    Birth control/protection: Surgical  Other Topics  Concern  . Not on file  Social History Narrative  . Not on file   Allergies  Allergen Reactions  . Clarithromycin   . Tramadol   . Zithromax [Azithromycin]    Family History  Problem Relation Age of Onset  . Diabetes Mother   . Hyperlipidemia Mother   . Hypertension Mother   . Breast cancer Mother 75       invasive mammary carcinoma  . Alcohol abuse Father   . Mental illness Father   . Mitral valve prolapse Father   . COPD Father   . Heart failure Father   . Diabetes Brother   . Cancer Paternal Grandmother        bladder  . Breast cancer Paternal Grandmother   . Breast cancer Other 74       maternal great aunt     Past medical history, social, surgical and family history all reviewed in electronic medical record.  No pertanent information unless stated regarding to the chief complaint.   Review of Systems:Review of systems updated and as accurate as of 12/04/16  No headache, visual changes, nausea, vomiting, diarrhea, constipation, dizziness, abdominal pain, skin rash, fevers, chills, night sweats, weight loss, swollen lymph nodes, body aches, joint swelling, muscle aches, chest pain, shortness of breath, mood changes.  Objective  There were no vitals taken for this visit. Systems examined below as of 12/04/16   General: No apparent distress alert and oriented x3 mood and affect normal, dressed appropriately.  HEENT: Pupils equal, extraocular movements intact  Respiratory: Patient's speak in full sentences and does not appear short of breath  Cardiovascular: No lower extremity edema, non tender, no erythema  Skin: Warm dry intact with no signs of infection or rash on extremities or on axial skeleton.  Abdomen: Soft nontender  Neuro: Cranial nerves II through XII are intact, neurovascularly intact in all extremities with 2+ DTRs and 2+ pulses.  Lymph: No lymphadenopathy of posterior or anterior cervical chain or axillae bilaterally.  Gait normal with good balance and  coordination.  MSK:  Non tender with full range of motion and good stability and symmetric strength and tone of shoulders, elbows, wrist, hip, knee and ankles bilaterally.     Impression and Recommendations:     This case required medical decision making of moderate complexity.      Note: This dictation was prepared with Dragon dictation along with smaller phrase technology. Any transcriptional errors that result from this process are unintentional.

## 2016-12-05 ENCOUNTER — Ambulatory Visit: Payer: Self-pay | Admitting: Family Medicine

## 2016-12-16 ENCOUNTER — Telehealth: Payer: Self-pay | Admitting: Internal Medicine

## 2016-12-16 NOTE — Telephone Encounter (Signed)
Pt is aware that she does not need any labs before appt

## 2016-12-16 NOTE — Telephone Encounter (Signed)
Pt's appt rescheduled to 17/2018 do to inclement weather. Pt is doing fine on new medication. Does she need to come in for labs? Please advise.

## 2016-12-16 NOTE — Telephone Encounter (Signed)
Pt's last labs were 10/13/2016

## 2016-12-16 NOTE — Telephone Encounter (Signed)
NO

## 2016-12-19 ENCOUNTER — Ambulatory Visit: Payer: Self-pay | Admitting: Internal Medicine

## 2016-12-19 ENCOUNTER — Ambulatory Visit: Payer: Self-pay | Admitting: Family Medicine

## 2016-12-27 ENCOUNTER — Other Ambulatory Visit: Payer: Self-pay | Admitting: Internal Medicine

## 2016-12-27 NOTE — Telephone Encounter (Signed)
Refilled: 04/07/2016 Last OV: 09/19/2016 Next OV: 01/16/2017

## 2017-01-08 NOTE — Progress Notes (Signed)
[name] [place], [place] [address] Phone: [phone] Subjective:    CC: Back pain, knee pain follow-up  [id number]  Patricia [name] is a 46 y.o. female coming in with complaint of back pain.  Patient does have degenerative disc disease.  Has responded fairly well to osteopathic manipulation.  Patient has had some increasing stress recently.  Not taking care of herself.  Recently lost her mother.  States that because of this is having worsening pain overall.  Feels like there is more tightness.  Keeping her from sleeping.  Patient also states that her knee seems to be worsening again.  Had a partial tear of the patella tendon.  Went to formal physical therapy.  Felt like she is making some progress with the nitroglycerin but seems now that the pain is worsening.  Has times when she feels like her knee is collapsing on her and has fallen 3 times.  Patient is looking for something else to be done.    Past Medical History:  Diagnosis Date  . Premature atrial contractions    Echo 04/2014: Normal - EF 55-60%, No RWMA, Normal Vavles & Diastolic Fxn; 48 hr Monitor - Frequent PACs with1 short run of  PAT  . Thyroid disease    thyroid nodules   Past Surgical History:  Procedure Laterality Date  . 48 hour Holter Monitor  05/04/2014   5 PVCs, 359 PACs (some with aberrant conduction) 1 short run of 3 beats PAT, no true arrhythmia  . ABDOMINAL HYSTERECTOMY  Dec 2012   secondary to fibroid, heavy bleeding   . BREAST EXCISIONAL BIOPSY Right 1990   neg  . BREAST LUMPECTOMY Right 1991 or 1992   fibroadenoma  . TONSILLECTOMY  May 2012   [name]  . TRANSTHORACIC ECHOCARDIOGRAM  04/2014   NORMAL.  EF 55-60%, no Regional WMA, Norml Valves, normal diastolic Fxn, normal RV / RVSP   Social History   Socioeconomic History  . Marital status: Married    Spouse name: None  . Number of children: None  . Years of education: None  . Highest  education level: None  Social Needs  . Financial resource strain: None  . Food insecurity - worry: None  . Food insecurity - inability: None  . Transportation needs - medical: None  . Transportation needs - non-medical: None  Occupational History  . None  Tobacco Use  . Smoking status: Never Smoker  . Smokeless tobacco: Never Used  Substance and Sexual Activity  . Alcohol use: Yes    Comment: occasional  . Drug use: No  . Sexual activity: Yes    Birth control/protection: Surgical  Other Topics Concern  . None  Social History Narrative  . None   Allergies  Allergen Reactions  . Clarithromycin   . Tramadol   . Zithromax [Azithromycin]    Family History  Problem Relation Age of Onset  . Diabetes Mother   . Hyperlipidemia Mother   . Hypertension Mother   . Breast cancer Mother 34       invasive mammary carcinoma  . Alcohol abuse Father   . Mental illness Father   . Mitral valve prolapse Father   . COPD Father   . Heart failure Father   . Diabetes Brother   . Cancer Paternal Grandmother        bladder  . Breast cancer Paternal Grandmother   . Breast cancer Other 33  maternal great aunt     Past medical history, social, surgical and family history all reviewed in electronic medical record.  No pertanent information unless stated regarding to the chief complaint.   Review of Systems:Review of systems updated and as accurate as of 01/09/17  No headache, visual changes, nausea, vomiting, diarrhea, constipation, dizziness, abdominal pain, skin rash, fevers, chills, night sweats, weight loss, swollen lymph nodes, body aches,, chest pain, shortness of breath, mood changes.  Positive joint swelling and muscle aches  Objective  Blood pressure 134/80, pulse 86, height 5\' 9"  (1.753 m), weight 218 lb (98.9 kg), SpO2 98 %. Systems examined below as of 01/09/17   General: No apparent distress alert and oriented x3 mood and affect normal, dressed appropriately.  HEENT:  Pupils equal, extraocular movements intact  Respiratory: Patient's speak in full sentences and does not appear short of breath  Cardiovascular: No lower extremity edema, non tender, no erythema  Skin: Warm dry intact with no signs of infection or rash on extremities or on axial skeleton.  Abdomen: Soft nontender  Neuro: Cranial nerves II through XII are intact, neurovascularly intact in all extremities with 2+ DTRs and 2+ pulses.  Lymph: No lymphadenopathy of posterior or anterior cervical chain or axillae bilaterally.  Gait normal with good balance and coordination.  MSK:  Non tender with full range of motion and good stability and symmetric strength and tone of shoulders, elbows, wrist, hip and ankles bilaterally.  Left knee exam still shows the patient does have some swelling over the tibial tuberosity as well as the inferior aspect of the patella at the patella tendon insertion.  Tender to palpation in this area.  Mild subluxation noted of the knee.  Back exam shows significant tightness in the paraspinal musculature lumbar spine right greater than left.  Positive Corky Sox.  Significant tightness of the right hamstring compared to contralateral side but negative straight leg test.  Full strength with deep tendon reflexes intact  Osteopathic findings C6 flexed rotated and side bent left T3 extended rotated and side bent right inhaled third rib T8 extended rotated and side bent left L2 flexed rotated and side bent right Sacrum right on right     Impression and Recommendations:     This case required medical decision making of moderate complexity.      Note: This dictation was prepared with Dragon dictation along with smaller phrase technology. Any transcriptional errors that result from this process are unintentional.

## 2017-01-09 ENCOUNTER — Encounter: Payer: Self-pay | Admitting: Family Medicine

## 2017-01-09 ENCOUNTER — Ambulatory Visit (INDEPENDENT_AMBULATORY_CARE_PROVIDER_SITE_OTHER): Payer: 59 | Admitting: Family Medicine

## 2017-01-09 VITALS — BP 134/80 | HR 86 | Ht 69.0 in | Wt 218.0 lb

## 2017-01-09 DIAGNOSIS — S86892A Other injury of other muscle(s) and tendon(s) at lower leg level, left leg, initial encounter: Secondary | ICD-10-CM

## 2017-01-09 DIAGNOSIS — M2550 Pain in unspecified joint: Secondary | ICD-10-CM

## 2017-01-09 DIAGNOSIS — M999 Biomechanical lesion, unspecified: Secondary | ICD-10-CM | POA: Diagnosis not present

## 2017-01-09 DIAGNOSIS — M5136 Other intervertebral disc degeneration, lumbar region: Secondary | ICD-10-CM | POA: Diagnosis not present

## 2017-01-09 DIAGNOSIS — M25539 Pain in unspecified wrist: Secondary | ICD-10-CM | POA: Diagnosis not present

## 2017-01-09 DIAGNOSIS — M51369 Other intervertebral disc degeneration, lumbar region without mention of lumbar back pain or lower extremity pain: Secondary | ICD-10-CM

## 2017-01-09 MED ORDER — TRAZODONE HCL 50 MG PO TABS: 25.0000 mg | ORAL_TABLET | Freq: Every evening | ORAL | 3 refills | Status: DC | PRN

## 2017-01-09 MED ORDER — KETOROLAC TROMETHAMINE 60 MG/2ML IM SOLN
60.0000 mg | Freq: Once | INTRAMUSCULAR | Status: AC
  Administered 2017-01-09: 60 mg via INTRAMUSCULAR

## 2017-01-09 NOTE — Assessment & Plan Note (Signed)
Seem to be making improvement but no worsening in fall 3 times.  Patient has had an MRI previously but does seem to be affecting daily activities as well as her job and safety.  Patient will be referred again to the orthopedic surgery now to discuss possible surgical intervention with her failing all other treatment options at this time.

## 2017-01-09 NOTE — Assessment & Plan Note (Signed)
Decision today to treat with OMT was based on Physical Exam  After verbal consent patient was treated with HVLA, ME, FPR techniques in cervical, thoracic, lumbar and sacral areas  Patient tolerated the procedure well with improvement in symptoms  Patient given exercises, stretches and lifestyle modifications  See medications in patient instructions if given  Patient will follow up in 4 weeks 

## 2017-01-09 NOTE — Assessment & Plan Note (Signed)
Known some degenerative disc disease.  To anything else.  Some more increased muscle tightness.  Trazodone given for ablation again.  Follow-up again in 4 weeks

## 2017-01-09 NOTE — Patient Instructions (Addendum)
Good to see you  Ice is your friend I am sorry for your loss.  Trazadone nightly  Continue the other medicines.  Wear the brace at night  See me again in 2 weeks

## 2017-01-10 DIAGNOSIS — M4712 Other spondylosis with myelopathy, cervical region: Secondary | ICD-10-CM

## 2017-01-10 DIAGNOSIS — M4722 Other spondylosis with radiculopathy, cervical region: Secondary | ICD-10-CM

## 2017-01-10 HISTORY — DX: Other spondylosis with myelopathy, cervical region: M47.12

## 2017-01-10 HISTORY — DX: Other spondylosis with myelopathy, cervical region: M47.22

## 2017-01-15 ENCOUNTER — Emergency Department: Payer: 59

## 2017-01-15 ENCOUNTER — Other Ambulatory Visit: Payer: Self-pay

## 2017-01-15 ENCOUNTER — Emergency Department
Admission: EM | Admit: 2017-01-15 | Discharge: 2017-01-15 | Disposition: A | Discharge status: Home / Self Care | Payer: 59 | Attending: Emergency Medicine | Admitting: Emergency Medicine

## 2017-01-15 ENCOUNTER — Encounter: Payer: Self-pay | Admitting: Emergency Medicine

## 2017-01-15 DIAGNOSIS — Z7984 Long term (current) use of oral hypoglycemic drugs: Secondary | ICD-10-CM | POA: Diagnosis not present

## 2017-01-15 DIAGNOSIS — M549 Dorsalgia, unspecified: Secondary | ICD-10-CM

## 2017-01-15 DIAGNOSIS — I10 Essential (primary) hypertension: Secondary | ICD-10-CM | POA: Insufficient documentation

## 2017-01-15 DIAGNOSIS — E119 Type 2 diabetes mellitus without complications: Secondary | ICD-10-CM | POA: Insufficient documentation

## 2017-01-15 DIAGNOSIS — R079 Chest pain, unspecified: Secondary | ICD-10-CM | POA: Diagnosis not present

## 2017-01-15 DIAGNOSIS — Z79899 Other long term (current) drug therapy: Secondary | ICD-10-CM | POA: Diagnosis not present

## 2017-01-15 DIAGNOSIS — M62838 Other muscle spasm: Secondary | ICD-10-CM | POA: Insufficient documentation

## 2017-01-15 HISTORY — DX: Essential (primary) hypertension: I10

## 2017-01-15 LAB — CBC
HCT: 38.5 % (ref 35.0–47.0)
Hemoglobin: 12.9 g/dL (ref 12.0–16.0)
MCH: 28.1 pg (ref 26.0–34.0)
MCHC: 33.6 g/dL (ref 32.0–36.0)
MCV: 83.6 fL (ref 80.0–100.0)
Platelets: 293 10*3/uL (ref 150–440)
RBC: 4.61 MIL/uL (ref 3.80–5.20)
RDW: 14.6 % — ABNORMAL HIGH (ref 11.5–14.5)
WBC: 9.7 10*3/uL (ref 3.6–11.0)

## 2017-01-15 LAB — BASIC METABOLIC PANEL
Anion gap: 8 (ref 5–15)
BUN: 13 mg/dL (ref 6–20)
CO2: 22 mmol/L (ref 22–32)
Calcium: 8.6 mg/dL — ABNORMAL LOW (ref 8.9–10.3)
Chloride: 104 mmol/L (ref 101–111)
Creatinine, Ser: 0.7 mg/dL (ref 0.44–1.00)
GFR calc Af Amer: >60 mL/min (ref >60)
GFR calc non Af Amer: >60 mL/min (ref >60)
Glucose, Bld: 119 mg/dL — ABNORMAL HIGH (ref 65–99)
Potassium: 3.6 mmol/L (ref 3.5–5.1)
Sodium: 134 mmol/L — ABNORMAL LOW (ref 135–145)

## 2017-01-15 LAB — URINALYSIS, COMPLETE (UACMP) WITH MICROSCOPIC
Bacteria, UA: NONE SEEN
Bilirubin Urine: NEGATIVE
Glucose, UA: NEGATIVE mg/dL
Ketones, ur: NEGATIVE mg/dL
Leukocytes, UA: NEGATIVE
Nitrite: NEGATIVE
Protein, ur: NEGATIVE mg/dL
Specific Gravity, Urine: 1.006 (ref 1.005–1.030)
pH: 7 (ref 5.0–8.0)

## 2017-01-15 LAB — HEPATIC FUNCTION PANEL
ALT: 14 U/L (ref 14–54)
AST: 19 U/L (ref 15–41)
Albumin: 3.3 g/dL — ABNORMAL LOW (ref 3.5–5.0)
Alkaline Phosphatase: 59 U/L (ref 38–126)
Bilirubin, Direct: <0.1 mg/dL — ABNORMAL LOW (ref 0.1–0.5)
Total Bilirubin: 0.3 mg/dL (ref 0.3–1.2)
Total Protein: 7 g/dL (ref 6.5–8.1)

## 2017-01-15 LAB — TROPONIN I: Troponin I: <0.03 ng/mL (ref <0.03)

## 2017-01-15 LAB — LIPASE, BLOOD: Lipase: 25 U/L (ref 11–51)

## 2017-01-15 LAB — POCT PREGNANCY, URINE: Preg Test, Ur: NEGATIVE

## 2017-01-15 MED ORDER — PREDNISONE 20 MG PO TABS
40.0000 mg | ORAL_TABLET | Freq: Once | ORAL | Status: AC
  Administered 2017-01-15: 40 mg via ORAL
  Filled 2017-01-15: qty 2

## 2017-01-15 MED ORDER — HYDROMORPHONE HCL 1 MG/ML IJ SOLN
1.0000 mg | Freq: Once | INTRAMUSCULAR | Status: AC
  Administered 2017-01-15: 1 mg via INTRAVENOUS
  Filled 2017-01-15: qty 1

## 2017-01-15 MED ORDER — HYDROCODONE-ACETAMINOPHEN 5-325 MG PO TABS: 1.0000 | ORAL_TABLET | Freq: Four times a day (QID) | ORAL | 0 refills | Status: DC | PRN

## 2017-01-15 MED ORDER — PREDNISONE 10 MG PO TABS: ORAL_TABLET | ORAL | 0 refills | Status: DC

## 2017-01-15 MED ORDER — MORPHINE SULFATE (PF) 4 MG/ML IV SOLN
4.0000 mg | Freq: Once | INTRAVENOUS | Status: AC
  Administered 2017-01-15: 4 mg via INTRAVENOUS
  Filled 2017-01-15: qty 1

## 2017-01-15 MED ORDER — HYDROMORPHONE HCL 1 MG/ML IJ SOLN
0.5000 mg | Freq: Once | INTRAMUSCULAR | Status: AC
  Administered 2017-01-15: 0.5 mg via INTRAVENOUS
  Filled 2017-01-15: qty 1

## 2017-01-15 MED ORDER — HYDROMORPHONE HCL 1 MG/ML IJ SOLN
1.0000 mg | Freq: Once | INTRAMUSCULAR | Status: AC
Start: 2017-01-15 — End: 2017-01-15
  Administered 2017-01-15: 1 mg via INTRAVENOUS
  Filled 2017-01-15: qty 1

## 2017-01-15 MED ORDER — ONDANSETRON HCL 4 MG/2ML IJ SOLN
4.0000 mg | Freq: Once | INTRAMUSCULAR | Status: AC
  Administered 2017-01-15: 4 mg via INTRAVENOUS
  Filled 2017-01-15: qty 2

## 2017-01-15 MED ORDER — LORAZEPAM 2 MG/ML IJ SOLN
1.0000 mg | Freq: Once | INTRAMUSCULAR | Status: AC
  Administered 2017-01-15: 1 mg via INTRAVENOUS

## 2017-01-15 MED ORDER — DIAZEPAM 5 MG PO TABS
5.0000 mg | ORAL_TABLET | Freq: Once | ORAL | Status: AC
  Administered 2017-01-15: 5 mg via ORAL
  Filled 2017-01-15: qty 1

## 2017-01-15 MED ORDER — ONDANSETRON HCL 4 MG PO TABS: 4.0000 mg | ORAL_TABLET | Freq: Three times a day (TID) | ORAL | 0 refills | Status: DC | PRN

## 2017-01-15 MED ORDER — DIAZEPAM 5 MG PO TABS: 5.0000 mg | ORAL_TABLET | Freq: Three times a day (TID) | ORAL | 0 refills | Status: DC | PRN

## 2017-01-15 MED ORDER — SODIUM CHLORIDE 0.9 % IV BOLUS (SEPSIS)
1000.0000 mL | Freq: Once | INTRAVENOUS | Status: AC
  Administered 2017-01-15: 1000 mL via INTRAVENOUS

## 2017-01-15 MED ORDER — LORAZEPAM 2 MG/ML IJ SOLN
INTRAMUSCULAR | Status: AC
  Filled 2017-01-15: qty 1

## 2017-01-15 MED ORDER — DIAZEPAM 5 MG/ML IJ SOLN: 5.0000 mg | Freq: Once | INTRAMUSCULAR | Status: DC

## 2017-01-15 NOTE — Discharge Instructions (Signed)
You are evaluated for back pain over to the left arm, and your exam and evaluation are overall reassured today.  We discussed that you have a significant muscle spasm at the left mid back that I suspect is responsible for your discomfort.  It is possible you could be having pinched nerve related pain, please follow-up with your primary care doctor to discuss physical therapy versus massage versus further imaging such as MRI.  Return to the emergency department immediately for any new or worsening or uncontrolled pain, numbness, weakness, palpitations, dizziness or passing out, chest pain, shortness of breath, trouble breathing or any other symptoms concerning to you.

## 2017-01-15 NOTE — ED Notes (Signed)
FIRST NURSE NOTE:  Pt c/o pain between shoulder blades that radiates to the front, also c/o pain radiating down left arm. Pt placed in wheelchair and taken back for EKG.

## 2017-01-15 NOTE — ED Triage Notes (Signed)
Pt to ED via POV, pt states that she has chronic back pain but this morning she is having pressure in the left center of her chest that radiates into the left arm. Pt also having pain the left shoulder blade. Pt is pacing in room and appears uncomfortable. Pt denies N/V, shortness of breath. Pt states that she has hx/o SVT.
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
## 2017-01-16 ENCOUNTER — Encounter: Payer: Self-pay | Admitting: Internal Medicine

## 2017-01-16 ENCOUNTER — Ambulatory Visit (INDEPENDENT_AMBULATORY_CARE_PROVIDER_SITE_OTHER): Payer: 59 | Admitting: Family Medicine

## 2017-01-16 ENCOUNTER — Ambulatory Visit (INDEPENDENT_AMBULATORY_CARE_PROVIDER_SITE_OTHER): Payer: 59 | Admitting: Internal Medicine

## 2017-01-16 ENCOUNTER — Encounter: Payer: Self-pay | Admitting: Family Medicine

## 2017-01-16 VITALS — BP 144/92 | HR 83 | Temp 99.0°F | Resp 16 | Ht 69.0 in | Wt 217.6 lb

## 2017-01-16 DIAGNOSIS — M5413 Radiculopathy, cervicothoracic region: Secondary | ICD-10-CM

## 2017-01-16 DIAGNOSIS — M5412 Radiculopathy, cervical region: Secondary | ICD-10-CM | POA: Diagnosis not present

## 2017-01-16 DIAGNOSIS — M25512 Pain in left shoulder: Secondary | ICD-10-CM

## 2017-01-16 DIAGNOSIS — M546 Pain in thoracic spine: Secondary | ICD-10-CM | POA: Diagnosis not present

## 2017-01-16 DIAGNOSIS — M5410 Radiculopathy, site unspecified: Secondary | ICD-10-CM | POA: Diagnosis not present

## 2017-01-16 MED ORDER — DIAZEPAM 5 MG PO TABS: 5.0000 mg | ORAL_TABLET | Freq: Three times a day (TID) | ORAL | 3 refills | Status: DC | PRN

## 2017-01-16 MED ORDER — GABAPENTIN 100 MG PO CAPS: 200.0000 mg | ORAL_CAPSULE | Freq: Three times a day (TID) | ORAL | 3 refills | Status: DC

## 2017-01-16 NOTE — Patient Instructions (Addendum)
Start taking 1000 mg tylenol every 12 hours in addition to 800 mg motrin   Don't use motrin and prednisone together  Try using the valium instead of the flexeril   [facility] referral,    MRI cerivcal and thoracic spines have all been ordered
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
## 2017-01-16 NOTE — Assessment & Plan Note (Signed)
Patient is having more cervical radiculopathy.  Given trigger point injections today to see if that would alleviate some of the pain.  Started on gabapentin.  Awaiting MRI that was ordered by primary care provider.  Depending on the findings patient could be a candidate for possible epidurals.  We need to monitor the weakness that is noted on the left side.  Follow-up with me again after the MRI.

## 2017-01-16 NOTE — Progress Notes (Signed)
[name] [address], [place] [address] Phone: [phone] Subjective:    I'm seeing this patient by the request  of:    CC: Neck and back pain follow-up  [id number]  Patricia [name] is a 47 y.o. female coming in with complaint of neck and back pain follow-up.  Patient was actually seen previously for more of her knee recently and she is going to go see an orthopedic surgeon for that.  Patient started having increasing back pain.  Severe enough to cause spasms that cause patient to go to the emergency room.  Was given Dilaudid as well as Valium and muscle relaxers before it seemed to break.  Patient states that now seems to be more located in the left scapular region.  Can radiate up to her neck.  Noticing radiation down the left arm.  Rates the severity of pain is 9 out of 10.  Patient states that the medication only can help with his ibuprofen 800 mg and is using Tylenol patient was given prednisone by the emergency room and has taken 2 doses.  Was seen by primary care provider and was encouraged to stop the ibuprofen while she takes the prednisone.  Patient is concerned because she feels like her left hand is not working well either.        Past Medical History:  Diagnosis Date  . Hypertension   . Premature atrial contractions    Echo 04/2014: Normal - EF 55-60%, No RWMA, Normal Vavles & Diastolic Fxn; 48 hr Monitor - Frequent PACs with1 short run of  PAT  . Thyroid disease    thyroid nodules   Past Surgical History:  Procedure Laterality Date  . 48 hour Holter Monitor  05/04/2014   5 PVCs, 359 PACs (some with aberrant conduction) 1 short run of 3 beats PAT, no true arrhythmia  . ABDOMINAL HYSTERECTOMY  Dec 2012   secondary to fibroid, heavy bleeding   . BREAST EXCISIONAL BIOPSY Right 1990   neg  . BREAST LUMPECTOMY Right 1991 or 1992   fibroadenoma  . TONSILLECTOMY  May 2012   [name]  . TRANSTHORACIC ECHOCARDIOGRAM   04/2014   NORMAL.  EF 55-60%, no Regional WMA, Norml Valves, normal diastolic Fxn, normal RV / RVSP   Social History   Socioeconomic History  . Marital status: Married    Spouse name: None  . Number of children: None  . Years of education: None  . Highest education level: None  Social Needs  . Financial resource strain: None  . Food insecurity - worry: None  . Food insecurity - inability: None  . Transportation needs - medical: None  . Transportation needs - non-medical: None  Occupational History  . None  Tobacco Use  . Smoking status: Never Smoker  . Smokeless tobacco: Never Used  Substance and Sexual Activity  . Alcohol use: Yes    Comment: occasional  . Drug use: No  . Sexual activity: Yes    Birth control/protection: Surgical  Other Topics Concern  . None  Social History Narrative  . None   Allergies  Allergen Reactions  . Clarithromycin   . Tramadol   . Zithromax [Azithromycin]    Family History  Problem Relation Age of Onset  . Diabetes Mother   . Hyperlipidemia Mother   . Hypertension Mother   . Breast cancer Mother 54       invasive mammary carcinoma  . Alcohol abuse Father   .  Mental illness Father   . Mitral valve prolapse Father   . COPD Father   . Heart failure Father   . Diabetes Brother   . Cancer Paternal Grandmother        bladder  . Breast cancer Paternal Grandmother   . Breast cancer Other 73       maternal great aunt     Past medical history, social, surgical and family history all reviewed in electronic medical record.  No pertanent information unless stated regarding to the chief complaint.   Review of Systems:Review of systems updated and as accurate as of 01/16/17  No headache, visual changes, nausea, vomiting, diarrhea, constipation, dizziness, abdominal pain, skin rash, fevers, chills, night sweats, weight loss, swollen lymph nodes, body aches, joint swelling, muscle aches, chest pain, shortness of breath, mood changes.   Positive muscle aches  Objective  Blood pressure (!) 150/82, pulse (!) 103, resp. rate (!) 98, height 5\' 9"  (1.753 m), weight 216 lb (98 kg). Systems examined below as of 01/16/17   General: No apparent distress alert and oriented x3 mood and affect normal, dressed appropriately.  HEENT: Pupils equal, extraocular movements intact  Respiratory: Patient's speak in full sentences and does not appear short of breath  Cardiovascular: No lower extremity edema, non tender, no erythema  Skin: Warm dry intact with no signs of infection or rash on extremities or on axial skeleton.  Abdomen: Soft nontender  Neuro: Cranial nerves II through XII are intact, neurovascularly intact in all extremities with  2+ pulses.  Lymph: No lymphadenopathy of posterior or anterior cervical chain or axillae bilaterally.  Gait normal with good balance and coordination.  MSK:  Non tender with full range of motion and good stability and symmetric strength and tone of shoulders, elbows, wrist, hip, knee and ankles bilaterally.  Neck: Inspection loss of lordosis. No palpable stepoffs. Positive Spurling's maneuver. Weakness noted in the C7 distribution Negative Hoffman sign bilaterally Reflexes 1+ on the left tricep Significant muscle spasm in the left trapezius muscle   After verbal consent patient was prepped with alcohol swab and 4 distinct trigger points in the left trapezius was injected with a total of 3 cc of 0.5% Marcaine and 1 cc of Kenalog 40 mg/dL no blood loss.  Postinjection instructions given    Impression and Recommendations:     This case required medical decision making of moderate complexity.      Note: This dictation was prepared with Dragon dictation along with smaller phrase technology. Any transcriptional errors that result from this process are unintentional.

## 2017-01-16 NOTE — Patient Instructions (Addendum)
Good to see you  Gabapentin 200mg  up to 3 times a day   Trigger point injections today  I like the MRI cervical but don't need the thoracic. [id number] TENS unit is good.  Send me a message when you have the MRI and I will look at it. Then we will discuss possible injections.

## 2017-01-17 NOTE — Assessment & Plan Note (Signed)
Not clear if the symptoms are due to her previous elbow injury or due to a disk problem .  Given her concurrent back pain, need to rule out herniated disk and nerve impingement MRI cervical and thoracic spine ordered

## 2017-01-18 ENCOUNTER — Encounter: Payer: Self-pay | Admitting: Family Medicine

## 2017-01-18 NOTE — Progress Notes (Signed)
[name] [address] [place], [place] [address] Phone: [phone] Subjective:     CC: Back pain, neck pain follow-up  [id number]  Patricia [name] is a 47 y.o. female coming in with complaint of patient was seen 3 days ago for more of a cervical radiculopathy.  Patient has an MRI scheduled for Friday.  When we saw the patient we did do trigger point injections in the muscle that gave her approximately 1 day relief and pain is come back again.  Increase gabapentin patient has pain medications for breakthrough.  Patient states that the injections decreased her pain but within 12 hours her pain has increased again. She is in constant pain and states that she hasn't slept in 4 days.       Past Medical History:  Diagnosis Date  . Hypertension   . Premature atrial contractions    Echo 04/2014: Normal - EF 55-60%, No RWMA, Normal Vavles & Diastolic Fxn; 48 hr Monitor - Frequent PACs with1 short run of  PAT  . Thyroid disease    thyroid nodules   Past Surgical History:  Procedure Laterality Date  . 48 hour Holter Monitor  05/04/2014   5 PVCs, 359 PACs (some with aberrant conduction) 1 short run of 3 beats PAT, no true arrhythmia  . ABDOMINAL HYSTERECTOMY  Dec 2012   secondary to fibroid, heavy bleeding   . BREAST EXCISIONAL BIOPSY Right 1990   neg  . BREAST LUMPECTOMY Right 1991 or 1992   fibroadenoma  . TONSILLECTOMY  May 2012   Patricia [name]  . TRANSTHORACIC ECHOCARDIOGRAM  04/2014   NORMAL.  EF 55-60%, no Regional WMA, Norml Valves, normal diastolic Fxn, normal RV / RVSP   Social History   Socioeconomic History  . Marital status: Married    Spouse name: Not on file  . Number of children: Not on file  . Years of education: Not on file  . Highest education level: Not on file  Social Needs  . Financial resource strain: Not on file  . Food insecurity - worry: Not on file  . Food insecurity - inability: Not on file  .  Transportation needs - medical: Not on file  . Transportation needs - non-medical: Not on file  Occupational History  . Not on file  Tobacco Use  . Smoking status: Never Smoker  . Smokeless tobacco: Never Used  Substance and Sexual Activity  . Alcohol use: Yes    Comment: occasional  . Drug use: No  . Sexual activity: Yes    Birth control/protection: Surgical  Other Topics Concern  . Not on file  Social History Narrative  . Not on file   Allergies  Allergen Reactions  . Clarithromycin   . Tramadol   . Zithromax [Azithromycin]    Family History  Problem Relation Age of Onset  . Diabetes Mother   . Hyperlipidemia Mother   . Hypertension Mother   . Breast cancer Mother 38       invasive mammary carcinoma  . Alcohol abuse Father   . Mental illness Father   . Mitral valve prolapse Father   . COPD Father   . Heart failure Father   . Diabetes Brother   . Cancer Paternal Grandmother        bladder  . Breast cancer Paternal Grandmother   . Breast cancer Other 74       maternal great aunt     Past medical history, social, surgical  and family history all reviewed in electronic medical record.  No pertanent information unless stated regarding to the chief complaint.   Review of Systems:Review of systems updated and as accurate as of 01/19/17  No headache, visual changes, nausea, vomiting, diarrhea, constipation, dizziness, abdominal pain, skin rash, fevers, chills, night sweats, weight loss, swollen lymph nodes, body aches, joint swelling, chest pain, shortness of breath, mood changes.  Positive muscle aches with numbness and weakness of the left upper extremity  Objective  Blood pressure (!) 128/94, pulse 97, height 5\' 9"  (1.753 m), weight 221 lb (100.2 kg), SpO2 98 %. Systems examined below as of 01/19/17   General: No apparent distress alert and oriented x3 mood and affect normal, dressed appropriately.  HEENT: Pupils equal, extraocular movements intact  Respiratory:  Patient's speak in full sentences and does not appear short of breath  Cardiovascular: No lower extremity edema, non tender, no erythema  Skin: Warm dry intact with no signs of infection or rash on extremities or on axial skeleton.  Abdomen: Soft nontender  Neuro: Cranial nerves II through XII are intact, neurovascularly intact in all extremities with 2+ pulses.  Lymph: No lymphadenopathy of posterior or anterior cervical chain or axillae bilaterally.  Gait normal with good balance and coordination.  MSK:  Non tender with full range of motion and good stability and symmetric strength and tone of shoulders, elbows, wrist, hip, knee and ankles bilaterally.  Neck: Inspection loss of lordosis. No palpable stepoffs. Positive Spurling's maneuver. Patient does have limited range of motion with extension, left-sided side bending and rotation significantly secondary to pain Grip strength is decreased on the left side.  Significant weakness in the C8 distribution Negative Hoffman sign bilaterally 1+ deep tendon reflex of the triceps compared to the contralateral side.    Impression and Recommendations:     This case required medical decision making of moderate complexity.      Note: This dictation was prepared with Dragon dictation along with smaller phrase technology. Any transcriptional errors that result from this process are unintentional.

## 2017-01-19 ENCOUNTER — Other Ambulatory Visit: Payer: Self-pay | Admitting: Obstetrics and Gynecology

## 2017-01-19 ENCOUNTER — Encounter: Payer: Self-pay | Admitting: Family Medicine

## 2017-01-19 ENCOUNTER — Ambulatory Visit (INDEPENDENT_AMBULATORY_CARE_PROVIDER_SITE_OTHER): Payer: 59 | Admitting: Family Medicine

## 2017-01-19 ENCOUNTER — Other Ambulatory Visit: Payer: Self-pay

## 2017-01-19 VITALS — BP 128/94 | HR 97 | Ht 69.0 in | Wt 221.0 lb

## 2017-01-19 DIAGNOSIS — M5412 Radiculopathy, cervical region: Secondary | ICD-10-CM | POA: Insufficient documentation

## 2017-01-19 MED ORDER — GABAPENTIN 300 MG PO CAPS: 300.0000 mg | ORAL_CAPSULE | Freq: Four times a day (QID) | ORAL | 3 refills | Status: DC

## 2017-01-19 MED ORDER — KETOROLAC TROMETHAMINE 60 MG/2ML IM SOLN
60.0000 mg | Freq: Once | INTRAMUSCULAR | Status: AC
  Administered 2017-01-19: 60 mg via INTRAMUSCULAR

## 2017-01-19 MED ORDER — METHYLPREDNISOLONE ACETATE 80 MG/ML IJ SUSP
80.0000 mg | Freq: Once | INTRAMUSCULAR | Status: AC
  Administered 2017-01-19: 80 mg via INTRAMUSCULAR

## 2017-01-19 MED ORDER — VITAMIN D (ERGOCALCIFEROL) 1.25 MG (50000 UNIT) PO CAPS: 50000.0000 [IU] | ORAL_CAPSULE | ORAL | 0 refills | Status: DC

## 2017-01-19 MED ORDER — HYDROMORPHONE HCL 2 MG PO TABS: 2.0000 mg | ORAL_TABLET | ORAL | 0 refills | Status: DC | PRN

## 2017-01-19 NOTE — Assessment & Plan Note (Signed)
Patient has decreased deep tendon reflexes.  Increase gabapentin at this time.  Patient does not want to do pain medication secondary to allergy and stomach discomfort.  MRI ordered for tomorrow.  Hopefully this will tell us if she would be a candidate for an epidural.  Will hold out of work for the next 10 days.  Depending on advanced imaging this could change management.

## 2017-01-19 NOTE — Patient Instructions (Signed)
I am sorry you are hurting.  Gabapentin 300mg  in AM, 300mg  in PM and 600mg  at night Trazodone at night 2 injections today  Out of work until Next Monday  Lets see what the MRI shows and I will be talking to you

## 2017-01-19 NOTE — Progress Notes (Signed)
I

## 2017-01-20 ENCOUNTER — Ambulatory Visit
Admission: RE | Admit: 2017-01-20 | Discharge: 2017-01-20 | Disposition: A | Discharge status: Home / Self Care | Payer: 59 | Source: Ambulatory Visit | Attending: Internal Medicine | Admitting: Internal Medicine

## 2017-01-20 DIAGNOSIS — M546 Pain in thoracic spine: Secondary | ICD-10-CM | POA: Insufficient documentation

## 2017-01-20 DIAGNOSIS — M47816 Spondylosis without myelopathy or radiculopathy, lumbar region: Secondary | ICD-10-CM | POA: Diagnosis not present

## 2017-01-20 DIAGNOSIS — M4802 Spinal stenosis, cervical region: Secondary | ICD-10-CM | POA: Diagnosis not present

## 2017-01-20 DIAGNOSIS — M47814 Spondylosis without myelopathy or radiculopathy, thoracic region: Secondary | ICD-10-CM | POA: Insufficient documentation

## 2017-01-20 DIAGNOSIS — M5124 Other intervertebral disc displacement, thoracic region: Secondary | ICD-10-CM | POA: Diagnosis not present

## 2017-01-20 DIAGNOSIS — M5413 Radiculopathy, cervicothoracic region: Secondary | ICD-10-CM | POA: Insufficient documentation

## 2017-01-21 ENCOUNTER — Encounter: Payer: Self-pay | Admitting: Internal Medicine

## 2017-01-21 ENCOUNTER — Other Ambulatory Visit: Payer: Self-pay | Admitting: Internal Medicine

## 2017-01-21 MED ORDER — PREDNISONE 10 MG PO TABS: ORAL_TABLET | ORAL | 0 refills | Status: DC

## 2017-01-23 ENCOUNTER — Encounter: Payer: Self-pay | Admitting: Student in an Organized Health Care Education/Training Program

## 2017-01-23 ENCOUNTER — Other Ambulatory Visit: Payer: Self-pay

## 2017-01-23 ENCOUNTER — Ambulatory Visit
Admission: RE | Admit: 2017-01-23 | Discharge: 2017-01-23 | Disposition: A | Discharge status: Home / Self Care | Payer: 59 | Source: Ambulatory Visit | Attending: Student in an Organized Health Care Education/Training Program | Admitting: Student in an Organized Health Care Education/Training Program

## 2017-01-23 ENCOUNTER — Ambulatory Visit (HOSPITAL_BASED_OUTPATIENT_CLINIC_OR_DEPARTMENT_OTHER): Payer: 59 | Admitting: Student in an Organized Health Care Education/Training Program

## 2017-01-23 ENCOUNTER — Telehealth: Payer: Self-pay | Admitting: Family Medicine

## 2017-01-23 VITALS — BP 134/89 | HR 99 | Temp 97.2°F | Resp 16 | Ht 69.0 in | Wt 216.0 lb

## 2017-01-23 DIAGNOSIS — G5600 Carpal tunnel syndrome, unspecified upper limb: Secondary | ICD-10-CM | POA: Diagnosis not present

## 2017-01-23 DIAGNOSIS — M4802 Spinal stenosis, cervical region: Secondary | ICD-10-CM

## 2017-01-23 DIAGNOSIS — R531 Weakness: Secondary | ICD-10-CM | POA: Insufficient documentation

## 2017-01-23 DIAGNOSIS — G2581 Restless legs syndrome: Secondary | ICD-10-CM | POA: Insufficient documentation

## 2017-01-23 DIAGNOSIS — Z791 Long term (current) use of non-steroidal anti-inflammatories (NSAID): Secondary | ICD-10-CM | POA: Insufficient documentation

## 2017-01-23 DIAGNOSIS — Z833 Family history of diabetes mellitus: Secondary | ICD-10-CM | POA: Diagnosis not present

## 2017-01-23 DIAGNOSIS — M47814 Spondylosis without myelopathy or radiculopathy, thoracic region: Secondary | ICD-10-CM | POA: Diagnosis not present

## 2017-01-23 DIAGNOSIS — Z7952 Long term (current) use of systemic steroids: Secondary | ICD-10-CM | POA: Diagnosis not present

## 2017-01-23 DIAGNOSIS — I491 Atrial premature depolarization: Secondary | ICD-10-CM | POA: Diagnosis not present

## 2017-01-23 DIAGNOSIS — Z79899 Other long term (current) drug therapy: Secondary | ICD-10-CM | POA: Insufficient documentation

## 2017-01-23 DIAGNOSIS — M5412 Radiculopathy, cervical region: Secondary | ICD-10-CM

## 2017-01-23 DIAGNOSIS — K219 Gastro-esophageal reflux disease without esophagitis: Secondary | ICD-10-CM | POA: Insufficient documentation

## 2017-01-23 DIAGNOSIS — M546 Pain in thoracic spine: Secondary | ICD-10-CM | POA: Diagnosis not present

## 2017-01-23 DIAGNOSIS — Z79891 Long term (current) use of opiate analgesic: Secondary | ICD-10-CM | POA: Diagnosis not present

## 2017-01-23 DIAGNOSIS — I129 Hypertensive chronic kidney disease with stage 1 through stage 4 chronic kidney disease, or unspecified chronic kidney disease: Secondary | ICD-10-CM | POA: Diagnosis not present

## 2017-01-23 DIAGNOSIS — M4803 Spinal stenosis, cervicothoracic region: Secondary | ICD-10-CM | POA: Insufficient documentation

## 2017-01-23 DIAGNOSIS — Z825 Family history of asthma and other chronic lower respiratory diseases: Secondary | ICD-10-CM | POA: Diagnosis not present

## 2017-01-23 DIAGNOSIS — Z809 Family history of malignant neoplasm, unspecified: Secondary | ICD-10-CM | POA: Insufficient documentation

## 2017-01-23 DIAGNOSIS — Z811 Family history of alcohol abuse and dependence: Secondary | ICD-10-CM | POA: Insufficient documentation

## 2017-01-23 DIAGNOSIS — M25562 Pain in left knee: Secondary | ICD-10-CM | POA: Diagnosis not present

## 2017-01-23 DIAGNOSIS — Z90710 Acquired absence of both cervix and uterus: Secondary | ICD-10-CM | POA: Diagnosis not present

## 2017-01-23 DIAGNOSIS — Z888 Allergy status to other drugs, medicaments and biological substances status: Secondary | ICD-10-CM | POA: Insufficient documentation

## 2017-01-23 DIAGNOSIS — Z818 Family history of other mental and behavioral disorders: Secondary | ICD-10-CM | POA: Diagnosis not present

## 2017-01-23 DIAGNOSIS — M4726 Other spondylosis with radiculopathy, lumbar region: Secondary | ICD-10-CM | POA: Diagnosis not present

## 2017-01-23 DIAGNOSIS — M47812 Spondylosis without myelopathy or radiculopathy, cervical region: Secondary | ICD-10-CM

## 2017-01-23 DIAGNOSIS — Z9889 Other specified postprocedural states: Secondary | ICD-10-CM | POA: Insufficient documentation

## 2017-01-23 DIAGNOSIS — M4722 Other spondylosis with radiculopathy, cervical region: Secondary | ICD-10-CM

## 2017-01-23 DIAGNOSIS — M9981 Other biomechanical lesions of cervical region: Secondary | ICD-10-CM

## 2017-01-23 DIAGNOSIS — E669 Obesity, unspecified: Secondary | ICD-10-CM | POA: Insufficient documentation

## 2017-01-23 DIAGNOSIS — Z8249 Family history of ischemic heart disease and other diseases of the circulatory system: Secondary | ICD-10-CM | POA: Insufficient documentation

## 2017-01-23 MED ORDER — SODIUM CHLORIDE 0.9% FLUSH
1.0000 mL | Freq: Once | INTRAVENOUS | Status: AC
  Administered 2017-01-23: 10 mL

## 2017-01-23 MED ORDER — FENTANYL CITRATE (PF) 100 MCG/2ML IJ SOLN
25.0000 ug | INTRAMUSCULAR | Status: DC | PRN
  Administered 2017-01-23: 75 ug via INTRAVENOUS
  Filled 2017-01-23: qty 2

## 2017-01-23 MED ORDER — ROPIVACAINE HCL 2 MG/ML IJ SOLN
1.0000 mL | Freq: Once | INTRAMUSCULAR | Status: AC
  Administered 2017-01-23: 10 mL via EPIDURAL
  Filled 2017-01-23: qty 10

## 2017-01-23 MED ORDER — LACTATED RINGERS IV SOLN
1000.0000 mL | Freq: Once | INTRAVENOUS | Status: AC
  Administered 2017-01-23: 1000 mL via INTRAVENOUS

## 2017-01-23 MED ORDER — SODIUM CHLORIDE 0.9 % IJ SOLN
INTRAMUSCULAR | Status: AC
  Filled 2017-01-23: qty 10

## 2017-01-23 MED ORDER — IOPAMIDOL (ISOVUE-M 200) INJECTION 41%
10.0000 mL | Freq: Once | INTRAMUSCULAR | Status: AC
  Administered 2017-01-23: 10 mL via EPIDURAL
  Filled 2017-01-23: qty 10

## 2017-01-23 MED ORDER — LIDOCAINE HCL (PF) 1 % IJ SOLN
4.5000 mL | Freq: Once | INTRAMUSCULAR | Status: AC
  Administered 2017-01-23: 5 mL
  Filled 2017-01-23: qty 5

## 2017-01-23 MED ORDER — DEXAMETHASONE SODIUM PHOSPHATE 10 MG/ML IJ SOLN
10.0000 mg | Freq: Once | INTRAMUSCULAR | Status: AC
  Administered 2017-01-23: 10 mg
  Filled 2017-01-23: qty 1

## 2017-01-23 NOTE — Telephone Encounter (Signed)
Copied from [place] [id number]. Topic: Quick Communication - See Telephone Encounter >> Jan 23, 2017  8:28 AM Bea Graff, NT wrote: CRM for notification. See Telephone encounter for: Pt would like a call from Dr. Tamala Julian regarding the MRI she had on Friday. She is still in a lot of pain down her left arm and still unable to feel her fingers in that hand. She has been taking the medicine prescribed to her. She would like to speak to Dr. Tamala Julian ASAP to see what the next steps are.   01/23/17.
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
## 2017-01-23 NOTE — Patient Instructions (Addendum)
1.  UDS today 2.  Increase gabapentin so that you are taking 300 mg, 300 mg, 600 mg nightly 3.  Follow-up in 1-2 weeks.____________________________________________________________________________________________  Post-Procedure instructions Instructions:  Apply ice: Fill a plastic sandwich bag with crushed ice. Cover it with a small towel and apply to injection site. Apply for 15 minutes then remove x 15 minutes. Repeat sequence on day of procedure, until you go to bed. The purpose is to minimize swelling and discomfort after procedure.  Apply heat: Apply heat to procedure site starting the day following the procedure. The purpose is to treat any soreness and discomfort from the procedure.  Food intake: Start with clear liquids (like water) and advance to regular food, as tolerated.   Physical activities: Keep activities to a minimum for the first 8 hours after the procedure.   Driving: If you have received any sedation, you are not allowed to drive for 24 hours after your procedure.  Blood thinner: Restart your blood thinner 6 hours after your procedure. (Only for those taking blood thinners)  Insulin: As soon as you can eat, you may resume your normal dosing schedule. (Only for those taking insulin)  Infection prevention: Keep procedure site clean and dry.  Post-procedure Pain Diary: Extremely important that this be done correctly and accurately. Recorded information will be used to determine the next step in treatment.  Pain evaluated is that of treated area only. Do not include pain from an untreated area.  Complete every hour, on the hour, for the initial 8 hours. Set an alarm to help you do this part accurately.  Do not go to sleep and have it completed later. It will not be accurate.  Follow-up appointment: Keep your follow-up appointment after the procedure. Usually 2 weeks for most procedures. (6 weeks in the case of radiofrequency.) Bring you pain diary.  Expect:  From  numbing medicine (AKA: Local Anesthetics): Numbness or decrease in pain.  Onset: Full effect within 15 minutes of injected.  Duration: It will depend on the type of local anesthetic used. On the average, 1 to 8 hours.   From steroids: Decrease in swelling or inflammation. Once inflammation is improved, relief of the pain will follow.  Onset of benefits: Depends on the amount of swelling present. The more swelling, the longer it will take for the benefits to be seen. In some cases, up to 10 days.  Duration: Steroids will stay in the system x 2 weeks. Duration of benefits will depend on multiple posibilities including persistent irritating factors.  From procedure: Some discomfort is to be expected once the numbing medicine wears off. This should be minimal if ice and heat are applied as instructed. Call if:  You experience numbness and weakness that gets worse with time, as opposed to wearing off.  New onset bowel or bladder incontinence. (Spinal procedures only)  Emergency Numbers:  [facility] business hours (Monday - Thursday, 8:00 AM - 4:00 PM) (Friday, 9:00 AM - 12:00 Noon): [phone]  After hours: [phone] ____________________________________________________________________________________________

## 2017-01-23 NOTE — Telephone Encounter (Signed)
Discussed with pt

## 2017-01-23 NOTE — Progress Notes (Signed)
Patient's Name: Patricia [name]  MRN: [id number]  Referring Provider: [name], MD  DOB: [birth year]  PCP: [name], MD  DOS: 01/23/2017  Note by: Gillis Santa, MD  Service setting: Ambulatory outpatient  Specialty: Interventional Pain Management  Patient type: Established  Location: [facility] (AMB) Pain Management Facility  Visit type: Interventional Procedure   Primary Reason for Visit: Interventional Pain Management Treatment. CC: Arm Pain (left)  Procedure:  Anesthesia, Analgesia, Anxiolysis:  Type: Diagnostic, Inter-Laminar, Epidural Steroid Injection Region: Posterior Cervico-thoracic Region Level: C6/C7 Laterality: Left-Sided Paramedial  Type: Local Anesthesia with Moderate (Conscious) Sedation Local Anesthetic: Lidocaine 1% Route: Intravenous (IV) IV Access: Secured Sedation: Meaningful verbal contact was maintained at all times during the procedure  Indication(s): Analgesia and Anxiety   Indications: 1. Cervical radiculopathy   2. Neuroforaminal stenosis of cervical spine   3. Cervical facet joint syndrome   4. Osteoarthritis of spine with radiculopathy, cervical region    Pain Score: Pre-procedure: 6 /10 Post-procedure: 3 /10  Pre-op Assessment:  Patricia [name] is a 47 y.o. (year old), female patient, seen today for interventional treatment. She  has a past surgical history that includes Abdominal hysterectomy (Dec 2012); Tonsillectomy (May 2012); transthoracic echocardiogram (04/2014); 48 hour Holter Monitor (05/04/2014); Breast lumpectomy (Right, 1991 or 1992); and Breast excisional biopsy (Right, 1990). Patricia [name] has a current medication list which includes the following prescription(s): amlodipine, cyclobenzaprine, diazepam, gabapentin, hydrochlorothiazide, hydromorphone, hydroxyzine, ibuprofen, loratadine, losartan, montelukast, ondansetron, pramipexole, trazodone, vitamin d (ergocalciferol), zolpidem, hydrocodone-acetaminophen, pen needles, metformin,  nitroglycerin, phentermine, prednisone, and prednisone, and the following Facility-Administered Medications: fentanyl. Her primarily concern today is the Arm Pain (left)  Initial Vital Signs: Blood pressure 134/89, pulse 99, temperature (!) 97.2 F (36.2 C), resp. rate 16, height 5\' 9"  (1.753 m), weight 216 lb (98 kg), SpO2 98 %. BMI: Estimated body mass index is 31.9 kg/m as calculated from the following:   Height as of this encounter: 5\' 9"  (1.753 m).   Weight as of this encounter: 216 lb (98 kg).  Risk Assessment: Allergies: Reviewed. She is allergic to clarithromycin; tramadol; and zithromax [azithromycin].  Allergy Precautions: None required Coagulopathies: Reviewed. None identified.  Blood-thinner therapy: None at this time Active Infection(s): Reviewed. None identified. Patricia [name] is afebrile  Site Confirmation: Patricia [name] was asked to confirm the procedure and laterality before marking the site Procedure checklist: Completed Consent: Before the procedure and under the influence of no sedative(s), amnesic(s), or anxiolytics, the patient was informed of the treatment options, risks and possible complications. To fulfill our ethical and legal obligations, as recommended by the American Medical Association's Code of Ethics, I have informed the patient of my clinical impression; the nature and purpose of the treatment or procedure; the risks, benefits, and possible complications of the intervention; the alternatives, including doing nothing; the risk(s) and benefit(s) of the alternative treatment(s) or procedure(s); and the risk(s) and benefit(s) of doing nothing. The patient was provided information about the general risks and possible complications associated with the procedure. These may include, but are not limited to: failure to achieve desired goals, infection, bleeding, organ or nerve damage, allergic reactions, paralysis, and death. In addition, the patient was informed of those  risks and complications associated to Spine-related procedures, such as failure to decrease pain; infection (i.e.: Meningitis, epidural or intraspinal abscess); bleeding (i.e.: epidural hematoma, subarachnoid hemorrhage, or any other type of intraspinal or peri-dural bleeding); organ or nerve damage (i.e.: Any type of peripheral nerve, nerve root, or spinal cord injury)  with subsequent damage to sensory, motor, and/or autonomic systems, resulting in permanent pain, numbness, and/or weakness of one or several areas of the body; allergic reactions; (i.e.: anaphylactic reaction); and/or death. Furthermore, the patient was informed of those risks and complications associated with the medications. These include, but are not limited to: allergic reactions (i.e.: anaphylactic or anaphylactoid reaction(s)); adrenal axis suppression; blood sugar elevation that in diabetics may result in ketoacidosis or comma; water retention that in patients with history of congestive heart failure may result in shortness of breath, pulmonary edema, and decompensation with resultant heart failure; weight gain; swelling or edema; medication-induced neural toxicity; particulate matter embolism and blood vessel occlusion with resultant organ, and/or nervous system infarction; and/or aseptic necrosis of one or more joints. Finally, the patient was informed that Medicine is not an exact science; therefore, there is also the possibility of unforeseen or unpredictable risks and/or possible complications that may result in a catastrophic outcome. The patient indicated having understood very clearly. We have given the patient no guarantees and we have made no promises. Enough time was given to the patient to ask questions, all of which were answered to the patient's satisfaction. Patricia [name] has indicated that she wanted to continue with the procedure. Attestation: I, the ordering provider, attest that I have discussed with the patient the  benefits, risks, side-effects, alternatives, likelihood of achieving goals, and potential problems during recovery for the procedure that I have provided informed consent. Date: 01/23/2017; Time: 2:43 PM  Pre-Procedure Preparation:  Monitoring: As per clinic protocol. Respiration, ETCO2, SpO2, BP, heart rate and rhythm monitor placed and checked for adequate function Safety Precautions: Patient was assessed for positional comfort and pressure points before starting the procedure. Time-out: I initiated and conducted the "Time-out" before starting the procedure, as per protocol. The patient was asked to participate by confirming the accuracy of the "Time Out" information. Verification of the correct person, site, and procedure were performed and confirmed by me, the nursing staff, and the patient. "Time-out" conducted as per Joint Commission's Universal Protocol (UP.01.01.01). "Time-out" Date & Time: 01/23/2017; 1336 hrs.  Description of Procedure Process:   Position: Prone with head of the table was raised to facilitate breathing. Target Area: For Epidural Steroid injections the target is the interlaminar space, initially targeting the lower border of the superior vertebral body lamina. Approach: Paramedial approach. Area Prepped: Entire PosteriorCervical Region Prepping solution: ChloraPrep (2% chlorhexidine gluconate and 70% isopropyl alcohol) Safety Precautions: Aspiration looking for blood return was conducted prior to all injections. At no point did we inject any substances, as a needle was being advanced. No attempts were made at seeking any paresthesias. Safe injection practices and needle disposal techniques used. Medications properly checked for expiration dates. SDV (single dose vial) medications used. Description of the Procedure: Protocol guidelines were followed. The procedure needle was introduced through the skin, ipsilateral to the reported pain, and advanced to the target area. Bone was  contacted and the needle walked caudad, until the lamina was cleared. The epidural space was identified using "loss-of-resistance technique" with 2-3 ml of PF-NaCl (0.9% NSS), in a 5cc LOR glass syringe. Vitals:   01/23/17 1359 01/23/17 1407 01/23/17 1417 01/23/17 1427  BP: (!) 163/82 130/84 (!) 144/90 134/89  Pulse:      Resp: 13 15 17 16   Temp:  98.3 F (36.8 C)  (!) 97.2 F (36.2 C)  SpO2: 98% 100% 98% 98%  Weight:      Height:  Start Time: 1336 hrs. End Time: 1358 hrs. Materials:  Needle(s) Type: Epidural needle Gauge: 17G Length: 3.5-in Medication(s): We administered lactated ringers, fentaNYL, iopamidol, dexamethasone, ropivacaine (PF) 2 mg/mL (0.2%), sodium chloride flush, and lidocaine (PF). Please see chart orders for dosing details. 3.5 cc solution made of 1.5 cc of normal saline preservative-free, 1 cc of 0.2% ropivacaine, 1 cc of Decadron 10 mg/cc. Imaging Guidance (Spinal):  Type of Imaging Technique: Fluoroscopy Guidance (Spinal) Indication(s): Assistance in needle guidance and placement for procedures requiring needle placement in or near specific anatomical locations not easily accessible without such assistance. Exposure Time: Please see nurses notes. Contrast: Before injecting any contrast, we confirmed that the patient did not have an allergy to iodine, shellfish, or radiological contrast. Once satisfactory needle placement was completed at the desired level, radiological contrast was injected. Contrast injected under live fluoroscopy. No contrast complications. See chart for type and volume of contrast used. Fluoroscopic Guidance: I was personally present during the use of fluoroscopy. "Tunnel Vision Technique" used to obtain the best possible view of the target area. Parallax error corrected before commencing the procedure. "Direction-depth-direction" technique used to introduce the needle under continuous pulsed fluoroscopy. Once target was reached,  antero-posterior, oblique, and lateral fluoroscopic projection used confirm needle placement in all planes. Images permanently stored in EMR. Interpretation: I personally interpreted the imaging intraoperatively. Adequate needle placement confirmed in multiple planes. Appropriate spread of contrast into desired area was observed. No evidence of afferent or efferent intravascular uptake. No intrathecal or subarachnoid spread observed. Permanent images saved into the patient's record.  Antibiotic Prophylaxis:  Indication(s): None identified Antibiotic given: None  Post-operative Assessment:  EBL: None Complications: No immediate post-treatment complications observed by team, or reported by patient. Note: The patient tolerated the entire procedure well. A repeat set of vitals were taken after the procedure and the patient was kept under observation following institutional policy, for this type of procedure. Post-procedural neurological assessment was performed, showing return to baseline, prior to discharge. The patient was provided with post-procedure discharge instructions, including a section on how to identify potential problems. Should any problems arise concerning this procedure, the patient was given instructions to immediately contact us, at any time, without hesitation. In any case, we plan to contact the patient by telephone for a follow-up status report regarding this interventional procedure. Comments:  No additional relevant information. 4 out of 5 left upper extremity strength: Shoulder abduction, elbow flexion, elbow extension, thumb extension (at baseline) 5 out of 5 strength right upper extremity: Shoulder abduction, elbow flexion, elbow extension, thumb extension.  (At baseline) Plan of Care   Imaging Orders     DG C-Arm 1-60 Min-No Report  Procedure Orders     Cervical Epidural Injection  Continue meds as previously prescribed, increase Gabapentin to 300/300/600  Medications  ordered for procedure: Meds ordered this encounter  Medications  . lactated ringers infusion 1,000 mL  . fentaNYL (SUBLIMAZE) injection 25-100 mcg    Make sure Narcan is available in the pyxis when using this medication. In the event of respiratory depression (RR< 8/min): Titrate NARCAN (naloxone) in increments of 0.1 to 0.2 mg IV at 2-3 minute intervals, until desired degree of reversal.  . iopamidol (ISOVUE-M) 41 % intrathecal injection 10 mL  . dexamethasone (DECADRON) injection 10 mg  . ropivacaine (PF) 2 mg/mL (0.2%) (NAROPIN) injection 1 mL  . sodium chloride flush (NS) 0.9 % injection 1 mL  . lidocaine (PF) (XYLOCAINE) 1 % injection 4.5 mL   Medications  administered: We administered lactated ringers, fentaNYL, iopamidol, dexamethasone, ropivacaine (PF) 2 mg/mL (0.2%), sodium chloride flush, and lidocaine (PF).  See the medical record for exact dosing, route, and time of administration.  New Prescriptions   No medications on file   Disposition: Discharge home  Discharge Date & Time: 01/23/2017; 1429 hrs.   Physician-requested Follow-up: Return in about 2 weeks (around 02/06/2017) for Medication Management. Future Appointments  Date Time Provider [place]  01/24/2017  3:30 PM Gary Fleet [facility]  02/06/2017  8:45 AM Gillis Santa, MD [facility] None   Primary Care Physician: [name], MD Location: [facility] Note by: Gillis Santa, MD Date: 01/23/2017; Time: 2:45 PM  Disclaimer:  Medicine is not an exact science. The only guarantee in medicine is that nothing is guaranteed. It is important to note that the decision to proceed with this intervention was based on the information collected from the patient. The Data and conclusions were drawn from the patient's questionnaire, the interview, and the physical examination. Because the information was provided in large part by the patient, it cannot be guaranteed that it has not  been purposely or unconsciously manipulated. Every effort has been made to obtain as much relevant data as possible for this evaluation. It is important to note that the conclusions that lead to this procedure are derived in large part from the available data. Always take into account that the treatment will also be dependent on availability of resources and existing treatment guidelines, considered by other Pain Management Practitioners as being common knowledge and practice, at the time of the intervention. For Medico-Legal purposes, it is also important to point out that variation in procedural techniques and pharmacological choices are the acceptable norm. The indications, contraindications, technique, and results of the above procedure should only be interpreted and judged by a Board-Certified Interventional Pain Specialist with extensive familiarity and expertise in the same exact procedure and technique.

## 2017-01-23 NOTE — Telephone Encounter (Signed)
Does have nerve impingement  Would do well with a C7-T1 injection and can we order this and call her  Thank you

## 2017-01-24 ENCOUNTER — Other Ambulatory Visit: Payer: Self-pay | Admitting: Orthopaedic Surgery

## 2017-01-24 ENCOUNTER — Ambulatory Visit: Payer: Self-pay | Admitting: Family Medicine

## 2017-01-24 DIAGNOSIS — H02831 Dermatochalasis of right upper eyelid: Secondary | ICD-10-CM | POA: Diagnosis not present

## 2017-01-24 DIAGNOSIS — H52223 Regular astigmatism, bilateral: Secondary | ICD-10-CM | POA: Diagnosis not present

## 2017-01-24 DIAGNOSIS — H02834 Dermatochalasis of left upper eyelid: Secondary | ICD-10-CM | POA: Diagnosis not present

## 2017-01-24 DIAGNOSIS — H524 Presbyopia: Secondary | ICD-10-CM | POA: Diagnosis not present

## 2017-01-24 DIAGNOSIS — M25562 Pain in left knee: Secondary | ICD-10-CM

## 2017-01-25 ENCOUNTER — Encounter: Payer: Self-pay | Admitting: Family Medicine

## 2017-01-25 DIAGNOSIS — M5412 Radiculopathy, cervical region: Secondary | ICD-10-CM

## 2017-01-26 NOTE — Telephone Encounter (Signed)
Copied from [place] [id number]. Topic: Inquiry >> Jan 26, 2017  1:15 PM Neva Seat wrote: Pt checking on Referral - [facility]   [employer] sent FMLA paperwork to Dr. Tamala Julian to be filled out.  They need paperwork filled out to know why pt was taken out of work.   Once FMLA papers have been filled out, please fax ASAP so [employer] can notify pt's [facility] manager. Ch Manger needs this by Monday, Jan 21 so pt can be paid through [facility] or FMLA for this pay period.

## 2017-01-27 ENCOUNTER — Telehealth: Payer: Self-pay

## 2017-01-27 DIAGNOSIS — H02403 Unspecified ptosis of bilateral eyelids: Secondary | ICD-10-CM | POA: Diagnosis not present

## 2017-01-27 DIAGNOSIS — L819 Disorder of pigmentation, unspecified: Secondary | ICD-10-CM | POA: Diagnosis not present

## 2017-01-27 DIAGNOSIS — L578 Other skin changes due to chronic exposure to nonionizing radiation: Secondary | ICD-10-CM | POA: Diagnosis not present

## 2017-01-27 DIAGNOSIS — H5340 Unspecified visual field defects: Secondary | ICD-10-CM | POA: Diagnosis not present

## 2017-01-27 DIAGNOSIS — H02831 Dermatochalasis of right upper eyelid: Secondary | ICD-10-CM | POA: Diagnosis not present

## 2017-01-27 DIAGNOSIS — H02834 Dermatochalasis of left upper eyelid: Secondary | ICD-10-CM | POA: Diagnosis not present

## 2017-01-27 NOTE — Telephone Encounter (Signed)
Copied from [place] [phone]. Topic: Inquiry >> Jan 27, 2017 11:23 AM Pricilla Handler wrote: Reason for CRM: Patient called inquiring about her referral. Patient states that the referral has still not been received by the doctor's office. Patient wants a call back.       Thank You!!!

## 2017-01-28 LAB — COMPLIANCE DRUG ANALYSIS, UR

## 2017-01-30 ENCOUNTER — Ambulatory Visit: Payer: Self-pay | Admitting: Family Medicine

## 2017-01-30 NOTE — Telephone Encounter (Signed)
fmla work completed & faxed.

## 2017-01-31 ENCOUNTER — Encounter: Payer: Self-pay | Admitting: Internal Medicine

## 2017-01-31 DIAGNOSIS — M5020 Other cervical disc displacement, unspecified cervical region: Secondary | ICD-10-CM | POA: Diagnosis not present

## 2017-01-31 DIAGNOSIS — M5412 Radiculopathy, cervical region: Secondary | ICD-10-CM | POA: Diagnosis not present

## 2017-01-31 DIAGNOSIS — M4712 Other spondylosis with myelopathy, cervical region: Secondary | ICD-10-CM | POA: Diagnosis not present

## 2017-01-31 DIAGNOSIS — M4802 Spinal stenosis, cervical region: Secondary | ICD-10-CM | POA: Diagnosis not present

## 2017-02-01 ENCOUNTER — Ambulatory Visit
Admission: RE | Admit: 2017-02-01 | Discharge: 2017-02-01 | Disposition: A | Discharge status: Home / Self Care | Payer: 59 | Source: Ambulatory Visit | Attending: Orthopaedic Surgery | Admitting: Orthopaedic Surgery

## 2017-02-01 DIAGNOSIS — M25562 Pain in left knee: Secondary | ICD-10-CM | POA: Diagnosis not present

## 2017-02-01 DIAGNOSIS — S76112A Strain of left quadriceps muscle, fascia and tendon, initial encounter: Secondary | ICD-10-CM | POA: Insufficient documentation

## 2017-02-01 DIAGNOSIS — M94262 Chondromalacia, left knee: Secondary | ICD-10-CM | POA: Insufficient documentation

## 2017-02-01 DIAGNOSIS — X58XXXA Exposure to other specified factors, initial encounter: Secondary | ICD-10-CM | POA: Diagnosis not present

## 2017-02-02 ENCOUNTER — Other Ambulatory Visit: Payer: Self-pay | Admitting: Internal Medicine

## 2017-02-02 ENCOUNTER — Encounter: Payer: Self-pay | Admitting: Internal Medicine

## 2017-02-03 MED ORDER — LOSARTAN POTASSIUM 100 MG PO TABS: 100.0000 mg | ORAL_TABLET | Freq: Every day | ORAL | 0 refills | Status: DC

## 2017-02-06 ENCOUNTER — Ambulatory Visit: Payer: 59 | Admitting: Student in an Organized Health Care Education/Training Program

## 2017-02-06 DIAGNOSIS — M50121 Cervical disc disorder at C4-C5 level with radiculopathy: Secondary | ICD-10-CM | POA: Diagnosis not present

## 2017-02-06 DIAGNOSIS — M5020 Other cervical disc displacement, unspecified cervical region: Secondary | ICD-10-CM | POA: Diagnosis not present

## 2017-02-06 DIAGNOSIS — M4802 Spinal stenosis, cervical region: Secondary | ICD-10-CM | POA: Diagnosis not present

## 2017-02-06 DIAGNOSIS — M47812 Spondylosis without myelopathy or radiculopathy, cervical region: Secondary | ICD-10-CM | POA: Diagnosis not present

## 2017-02-06 DIAGNOSIS — M50223 Other cervical disc displacement at C6-C7 level: Secondary | ICD-10-CM | POA: Diagnosis not present

## 2017-02-06 DIAGNOSIS — Z0279 Encounter for issue of other medical certificate: Secondary | ICD-10-CM

## 2017-02-06 HISTORY — PX: OTHER SURGICAL HISTORY: SHX169

## 2017-02-08 ENCOUNTER — Encounter: Payer: Self-pay | Admitting: Internal Medicine

## 2017-02-08 NOTE — Telephone Encounter (Signed)
FMLA  Has been completed, signed & faxed, copy sent to scan & charged for.

## 2017-02-09 ENCOUNTER — Other Ambulatory Visit: Payer: Self-pay

## 2017-02-13 ENCOUNTER — Telehealth: Payer: Self-pay | Admitting: Internal Medicine

## 2017-02-13 NOTE — Telephone Encounter (Signed)
Caller name: Kathlee Nations  Relation to pt: [facility] employee pharmacy  Call back number: [phone]    Reason for call:  Checking on the status of message below and wanted to inform PCP pharmacy closes at 5pm

## 2017-02-13 NOTE — Telephone Encounter (Signed)
Pt called back for a refill on the Wellbutrin and wants to take 300mg .  I do not see where it was previously ordered. It is not listed on her med sheet. Please advise.  Pharmacy is [facility]

## 2017-02-13 NOTE — Telephone Encounter (Signed)
Copied from [place] [phone]. Topic: Quick Communication - See Telephone Encounter >> Feb 13, 2017 11:31 AM Bea Graff, NT wrote: CRM for notification. See Telephone encounter for: [facility] calling and states pt came in to get her Wellbutrin refilled. States dr was suppose to increase dosage. They do not have medication. CB#: [phone]  02/13/17.

## 2017-02-14 MED ORDER — BUPROPION HCL ER (XL) 300 MG PO TB24
300.0000 mg | ORAL_TABLET | Freq: Every day | ORAL | 1 refills | Status: DC
Start: 2017-02-14 — End: 2017-08-01

## 2017-02-14 NOTE — Telephone Encounter (Signed)
wellbutrin 300 mg xl sent to [facility]

## 2017-02-14 NOTE — Telephone Encounter (Signed)
Please advise 

## 2017-02-21 DIAGNOSIS — M4802 Spinal stenosis, cervical region: Secondary | ICD-10-CM | POA: Diagnosis not present

## 2017-03-05 ENCOUNTER — Telehealth: Payer: Self-pay | Admitting: Internal Medicine

## 2017-03-05 DIAGNOSIS — Z9889 Other specified postprocedural states: Secondary | ICD-10-CM | POA: Insufficient documentation

## 2017-03-31 ENCOUNTER — Other Ambulatory Visit: Payer: Self-pay | Admitting: Internal Medicine

## 2017-03-31 ENCOUNTER — Other Ambulatory Visit: Payer: Self-pay | Admitting: Family Medicine

## 2017-03-31 NOTE — Telephone Encounter (Signed)
Refilled: 09/19/2016 Last OV: 01/16/2017 Next OV: not scheduled

## 2017-04-03 DIAGNOSIS — L578 Other skin changes due to chronic exposure to nonionizing radiation: Secondary | ICD-10-CM | POA: Diagnosis not present

## 2017-04-03 DIAGNOSIS — D485 Neoplasm of uncertain behavior of skin: Secondary | ICD-10-CM | POA: Diagnosis not present

## 2017-04-03 DIAGNOSIS — Z86018 Personal history of other benign neoplasm: Secondary | ICD-10-CM | POA: Diagnosis not present

## 2017-04-03 NOTE — Telephone Encounter (Signed)
Printed, signed and faxed.  

## 2017-05-19 ENCOUNTER — Encounter: Payer: Self-pay | Admitting: Internal Medicine

## 2017-05-19 DIAGNOSIS — M25552 Pain in left hip: Secondary | ICD-10-CM

## 2017-05-22 ENCOUNTER — Ambulatory Visit
Admission: RE | Admit: 2017-05-22 | Discharge: 2017-05-22 | Disposition: A | Discharge status: Home / Self Care | Payer: 59 | Source: Ambulatory Visit | Attending: Internal Medicine | Admitting: Internal Medicine

## 2017-05-22 DIAGNOSIS — M25552 Pain in left hip: Secondary | ICD-10-CM | POA: Diagnosis not present

## 2017-05-22 DIAGNOSIS — S79912A Unspecified injury of left hip, initial encounter: Secondary | ICD-10-CM | POA: Diagnosis not present

## 2017-05-30 ENCOUNTER — Encounter: Payer: Self-pay | Admitting: Internal Medicine

## 2017-05-30 DIAGNOSIS — E079 Disorder of thyroid, unspecified: Secondary | ICD-10-CM

## 2017-05-30 DIAGNOSIS — M5412 Radiculopathy, cervical region: Secondary | ICD-10-CM | POA: Diagnosis not present

## 2017-05-30 DIAGNOSIS — I10 Essential (primary) hypertension: Secondary | ICD-10-CM | POA: Diagnosis not present

## 2017-05-30 DIAGNOSIS — E119 Type 2 diabetes mellitus without complications: Secondary | ICD-10-CM

## 2017-05-30 DIAGNOSIS — M4712 Other spondylosis with myelopathy, cervical region: Secondary | ICD-10-CM | POA: Diagnosis not present

## 2017-06-04 NOTE — Progress Notes (Signed)
[name] [address] [place], [place] [address] Phone: [phone] Subjective:     CC: Back pain  RWE:[id number]  Patricia [name] is a 47 y.o. female coming in with complaint of back pain. She has been having left hip pain. Pain is felt deep in the hip. Pain is intermittent and occurs at night when she lies down. Pain with knee flexion. Pain improves with pillow between knees. Denies any radiating pain.  Patient with movement seem to make it worse.      Past Medical History:  Diagnosis Date  . Hypertension   . Premature atrial contractions    Echo 04/2014: Normal - EF 55-60%, No RWMA, Normal Vavles & Diastolic Fxn; 48 hr Monitor - Frequent PACs with1 short run of  PAT  . Thyroid disease    thyroid nodules   Past Surgical History:  Procedure Laterality Date  . 48 hour Holter Monitor  05/04/2014   5 PVCs, 359 PACs (some with aberrant conduction) 1 short run of 3 beats PAT, no true arrhythmia  . ABDOMINAL HYSTERECTOMY  Dec 2012   secondary to fibroid, heavy bleeding   . BREAST EXCISIONAL BIOPSY Right 1990   neg  . BREAST LUMPECTOMY Right 1991 or 1992   fibroadenoma  . TONSILLECTOMY  May 2012   [name]  . TRANSTHORACIC ECHOCARDIOGRAM  04/2014   NORMAL.  EF 55-60%, no Regional WMA, Norml Valves, normal diastolic Fxn, normal RV / RVSP   Social History   Socioeconomic History  . Marital status: Married    Spouse name: Not on file  . Number of children: Not on file  . Years of education: Not on file  . Highest education level: Not on file  Occupational History  . Not on file  Social Needs  . Financial resource strain: Not on file  . Food insecurity:    Worry: Not on file    Inability: Not on file  . Transportation needs:    Medical: Not on file    Non-medical: Not on file  Tobacco Use  . Smoking status: Never Smoker  . Smokeless tobacco: Never Used  Substance and Sexual Activity  . Alcohol use: Yes    Comment:  occasional  . Drug use: No  . Sexual activity: Yes    Birth control/protection: Surgical  Lifestyle  . Physical activity:    Days per week: Not on file    Minutes per session: Not on file  . Stress: Not on file  Relationships  . Social connections:    Talks on phone: Not on file    Gets together: Not on file    Attends religious service: Not on file    Active member of club or organization: Not on file    Attends meetings of clubs or organizations: Not on file    Relationship status: Not on file  Other Topics Concern  . Not on file  Social History Narrative  . Not on file   Allergies  Allergen Reactions  . Clarithromycin   . Tramadol   . Zithromax [Azithromycin]    Family History  Problem Relation Age of Onset  . Diabetes Mother   . Hyperlipidemia Mother   . Hypertension Mother   . Breast cancer Mother 88       invasive mammary carcinoma  . Alcohol abuse Father   . Mental illness Father   . Mitral valve prolapse Father   . COPD Father   . Heart failure  Father   . Diabetes Brother   . Cancer Paternal Grandmother        bladder  . Breast cancer Paternal Grandmother   . Breast cancer Other 93       maternal great aunt     Past medical history, social, surgical and family history all reviewed in electronic medical record.  No pertanent information unless stated regarding to the chief complaint.   Review of Systems:Review of systems updated and as accurate as of 06/06/17  No headache, visual changes, nausea, vomiting, diarrhea, constipation, dizziness, abdominal pain, skin rash, fevers, chills, night sweats, weight loss, swollen lymph nodes, body aches, joint swelling,chest pain, shortness of breath, mood changes. Positive Muscle aches  Objective  Blood pressure 112/82, pulse 84, height 5\' 9"  (1.753 m), weight 219 lb (99.3 kg), SpO2 99 %. Systems examined below as of 06/06/17   General: No apparent distress alert and oriented x3 mood and affect normal, dressed  appropriately.  HEENT: Pupils equal, extraocular movements intact  Respiratory: Patient's speak in full sentences and does not appear short of breath  Cardiovascular: No lower extremity edema, non tender, no erythema  Skin: Warm dry intact with no signs of infection or rash on extremities or on axial skeleton.  Abdomen: Soft nontender  Neuro: Cranial nerves II through XII are intact, neurovascularly intact in all extremities with 2+ DTRs and 2+ pulses.  Lymph: No lymphadenopathy of posterior or anterior cervical chain or axillae bilaterally.  Gait normal with good balance and coordination.  MSK:  Non tender with full range of motion and good stability and symmetric strength and tone of shoulders, elbows, wrist, hip, knee and ankles bilaterally.  Neck: Inspection loss of lordosis. No palpable stepoffs. Loss of lordosis and movements Grip strength and sensation normal in bilateral hands Strength good C4 to T1 distribution No sensory change to C4 to T1 Negative Hoffman sign bilaterally Reflexes normal Positive trapezius spasm.   Back Exam:  Inspection: Unremarkable  Motion: Flexion 35 deg, Extension 25 deg, Side Bending to 30 deg bilaterally,  Rotation to 25 deg bilaterally  SLR laying: Negative  XSLR laying: Negative  Palpable tenderness: ttp paraspinal musculature lumbar  FABER: +faber  Sensory change: Gross sensation intact to all lumbar and sacral dermatomes.  Reflexes: 2+ at both patellar tendons, 2+ at achilles tendons, Babinski's downgoing.  Strength at foot  Plantar-flexion: 5/5 Dorsi-flexion: 5/5 Eversion: 5/5 Inversion: 5/5  Leg strength  Quad: 5/5 Hamstring: 5/5 Hip flexor: 5/5 Hip abductors: 4/5 but symmetric   Left hip  Pain with resisted adduction.  No pain with internal ROM Mild pain with faber    Osteopathic findings  T3 extended rotated and side bent right inhaled third rib T5 extended rotated and side bent left L3 flexed rotated and side bent right Sacrum  right on right          Impression and Recommendations:     This case required medical decision making of moderate complexity.      Note: This dictation was prepared with Dragon dictation along with smaller phrase technology. Any transcriptional errors that result from this process are unintentional.

## 2017-06-06 ENCOUNTER — Ambulatory Visit: Payer: 59 | Admitting: Family Medicine

## 2017-06-06 ENCOUNTER — Encounter

## 2017-06-06 ENCOUNTER — Encounter: Payer: Self-pay | Admitting: Family Medicine

## 2017-06-06 VITALS — BP 112/82 | HR 84 | Ht 69.0 in | Wt 219.0 lb

## 2017-06-06 DIAGNOSIS — M999 Biomechanical lesion, unspecified: Secondary | ICD-10-CM

## 2017-06-06 DIAGNOSIS — R1032 Left lower quadrant pain: Secondary | ICD-10-CM | POA: Diagnosis not present

## 2017-06-06 NOTE — Patient Instructions (Signed)
Good to see you  I would do Exercises 3 times a week.   Gabapentin 200mg  at night Add 100mg  of B6 to help the B12  Also K2 over the counter can help the hip  Thigh compression sleeve daily for 1 week then working out for the next month  See me again in 4 weeks

## 2017-06-06 NOTE — Assessment & Plan Note (Signed)
Decision today to treat with OMT was based on Physical Exam  After verbal consent patient was treated with HVLA, ME, FPR techniques in  thoracic, lumbar and sacral areas  Patient tolerated the procedure well with improvement in symptoms  Patient given exercises, stretches and lifestyle modifications  See medications in patient instructions if given  Patient will follow up in 4 weeks 

## 2017-06-06 NOTE — Assessment & Plan Note (Signed)
Left groin pain.  Likely more of an abductor strain.  Patient did respond fairly well to manipulation.  Discussed home exercises, compression sleeve, icing regimen.  Follow-up again in 4 weeks

## 2017-06-07 ENCOUNTER — Other Ambulatory Visit (INDEPENDENT_AMBULATORY_CARE_PROVIDER_SITE_OTHER): Payer: 59

## 2017-06-07 DIAGNOSIS — E079 Disorder of thyroid, unspecified: Secondary | ICD-10-CM | POA: Diagnosis not present

## 2017-06-07 DIAGNOSIS — E119 Type 2 diabetes mellitus without complications: Secondary | ICD-10-CM

## 2017-06-07 LAB — COMPREHENSIVE METABOLIC PANEL
ALT: 12 U/L (ref 0–35)
AST: 12 U/L (ref 0–37)
Albumin: 3.6 g/dL (ref 3.5–5.2)
Alkaline Phosphatase: 60 U/L (ref 39–117)
BUN: 9 mg/dL (ref 6–23)
CO2: 27 mEq/L (ref 19–32)
Calcium: 8.7 mg/dL (ref 8.4–10.5)
Chloride: 100 mEq/L (ref 96–112)
Creatinine, Ser: 0.76 mg/dL (ref 0.40–1.20)
GFR: 86.48 mL/min (ref >60.00)
Glucose, Bld: 128 mg/dL — ABNORMAL HIGH (ref 70–99)
Potassium: 4 mEq/L (ref 3.5–5.1)
Sodium: 134 mEq/L — ABNORMAL LOW (ref 135–145)
Total Bilirubin: 0.4 mg/dL (ref 0.2–1.2)
Total Protein: 7.1 g/dL (ref 6.0–8.3)

## 2017-06-07 LAB — HEMOGLOBIN A1C: Hgb A1c MFr Bld: 6.3 % (ref 4.6–6.5)

## 2017-06-07 LAB — LIPID PANEL
Cholesterol: 196 mg/dL (ref 0–200)
HDL: 41.6 mg/dL (ref >39.00)
NonHDL: 154.42
Total CHOL/HDL Ratio: 5
Triglycerides: 272 mg/dL — ABNORMAL HIGH (ref 0.0–149.0)
VLDL: 54.4 mg/dL — ABNORMAL HIGH (ref 0.0–40.0)

## 2017-06-07 LAB — MICROALBUMIN / CREATININE URINE RATIO
Creatinine,U: 123.9 mg/dL
Microalb Creat Ratio: 0.6 mg/g (ref 0.0–30.0)
Microalb, Ur: <0.7 mg/dL (ref 0.0–1.9)

## 2017-06-07 LAB — LDL CHOLESTEROL, DIRECT: Direct LDL: 116 mg/dL

## 2017-06-07 LAB — TSH: TSH: 2.69 u[IU]/mL (ref 0.35–4.50)

## 2017-06-12 ENCOUNTER — Encounter: Payer: Self-pay | Admitting: Internal Medicine

## 2017-06-12 ENCOUNTER — Ambulatory Visit (INDEPENDENT_AMBULATORY_CARE_PROVIDER_SITE_OTHER): Payer: 59 | Admitting: Internal Medicine

## 2017-06-12 VITALS — BP 122/74 | HR 79 | Temp 98.2°F | Resp 14 | Ht 69.0 in | Wt 220.4 lb

## 2017-06-12 DIAGNOSIS — E669 Obesity, unspecified: Secondary | ICD-10-CM

## 2017-06-12 DIAGNOSIS — Z0000 Encounter for general adult medical examination without abnormal findings: Secondary | ICD-10-CM | POA: Diagnosis not present

## 2017-06-12 DIAGNOSIS — F321 Major depressive disorder, single episode, moderate: Secondary | ICD-10-CM

## 2017-06-12 DIAGNOSIS — R7303 Prediabetes: Secondary | ICD-10-CM | POA: Diagnosis not present

## 2017-06-12 DIAGNOSIS — E079 Disorder of thyroid, unspecified: Secondary | ICD-10-CM

## 2017-06-12 DIAGNOSIS — M5410 Radiculopathy, site unspecified: Secondary | ICD-10-CM | POA: Diagnosis not present

## 2017-06-12 MED ORDER — ARIPIPRAZOLE 2 MG PO TABS: 2.0000 mg | ORAL_TABLET | Freq: Every day | ORAL | 1 refills | Status: DC
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
## 2017-06-12 NOTE — Patient Instructions (Signed)
Trial of abilify 2 mg daily after dinner. continue wellbutrin   You can increase the dose after 2 weeks to 4 mg f needed   Resume  Metformin  once the trial of Abilify  has begun and no nusea is experienced

## 2017-06-13 ENCOUNTER — Encounter: Payer: Self-pay | Admitting: Internal Medicine

## 2017-06-13 DIAGNOSIS — R7303 Prediabetes: Secondary | ICD-10-CM | POA: Insufficient documentation

## 2017-06-13 NOTE — Assessment & Plan Note (Signed)
Adding Abilify 2 mg to wellbutrin XL 300 mg.  Encouraged to consider psychotherapy and grief coounselling.

## 2017-06-13 NOTE — Assessment & Plan Note (Signed)
Secondary to cervical spine spondylosis , now resolved s/p 4 level ACDF in early May by [place].   Residual numbness of left index and middle finer.  Patient is right handed.

## 2017-06-13 NOTE — Assessment & Plan Note (Addendum)
Annual thyroid function check is normal.  Left thyroid nodule shrinkinb by 2017 [place],  previously biopsied and normal.   Lab Results  Component Value Date   TSH 2.69 06/07/2017

## 2017-06-13 NOTE — Assessment & Plan Note (Addendum)
Annual comprehensive preventive exam was done as well as an evaluation and management of chronic conditions .  During the course of the visit the patient was educated and counseled about appropriate screening and preventive services including :  diabetes screening, lipid analysis with projected  10 year  risk for CAD , nutrition counseling, breast, vaginal  and colorectal cancer screening, and recommended immunizations.  Printed recommendations for health maintenance screenings was given.  S/p total hysterectomy: vaginal cuff sampling very 3 years (due 2020)

## 2017-06-13 NOTE — Assessment & Plan Note (Addendum)
A1c has risen to 6.2 due to weight gain and lack of exercise. Addressed today ; repeat in 6 months .
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
## 2017-06-15 ENCOUNTER — Other Ambulatory Visit: Payer: Self-pay | Admitting: Internal Medicine

## 2017-06-15 DIAGNOSIS — Z1231 Encounter for screening mammogram for malignant neoplasm of breast: Secondary | ICD-10-CM

## 2017-06-16 DIAGNOSIS — F4320 Adjustment disorder, unspecified: Secondary | ICD-10-CM | POA: Diagnosis not present

## 2017-06-19 ENCOUNTER — Other Ambulatory Visit: Payer: Self-pay | Admitting: Internal Medicine

## 2017-06-22 ENCOUNTER — Telehealth: Payer: Self-pay | Admitting: Internal Medicine

## 2017-06-22 NOTE — Telephone Encounter (Signed)
Pt has been scheduled for appt °

## 2017-06-22 NOTE — Telephone Encounter (Signed)
Spoke with patient.  Will do FMLA,  Please schedule her for follow up appt July 3 at 4:00 pm

## 2017-06-23 ENCOUNTER — Other Ambulatory Visit (INDEPENDENT_AMBULATORY_CARE_PROVIDER_SITE_OTHER): Payer: 59

## 2017-06-23 ENCOUNTER — Other Ambulatory Visit: Payer: Self-pay | Admitting: Internal Medicine

## 2017-06-23 DIAGNOSIS — Z113 Encounter for screening for infections with a predominantly sexual mode of transmission: Secondary | ICD-10-CM

## 2017-06-25 ENCOUNTER — Telehealth: Payer: Self-pay | Admitting: Internal Medicine

## 2017-06-25 NOTE — Telephone Encounter (Signed)
The FMLA form is completed and in red folder  The charge is $50. plesae notfy patient when it has been faxed to [facility] x

## 2017-06-26 ENCOUNTER — Encounter: Payer: Self-pay | Admitting: Internal Medicine

## 2017-06-26 LAB — HIV ANTIBODY (ROUTINE TESTING W REFLEX): HIV 1&2 Ab, 4th Generation: NONREACTIVE

## 2017-06-26 LAB — SYPHILIS: RPR W/REFLEX TO RPR TITER AND TREPONEMAL ANTIBODIES, TRADITIONAL SCREENING AND DIAGNOSIS ALGORITHM: RPR Ser Ql: NONREACTIVE

## 2017-06-26 LAB — HSV(HERPES SIMPLEX VRS) I + II AB-IGG
HSV 1 IGG,TYPE SPECIFIC AB: <0.9 {index}
HSV 2 IGG,TYPE SPECIFIC AB: <0.9 {index}

## 2017-06-26 LAB — HEPATITIS C ANTIBODY
Hepatitis C Ab: NONREACTIVE
SIGNAL TO CUT-OFF: 0.03 (ref <1.00)

## 2017-06-27 DIAGNOSIS — M5412 Radiculopathy, cervical region: Secondary | ICD-10-CM

## 2017-06-27 DIAGNOSIS — F329 Major depressive disorder, single episode, unspecified: Secondary | ICD-10-CM

## 2017-06-27 NOTE — Telephone Encounter (Signed)
Spoke with pt and informed her the paperwork has been completed and faxed. Pt gave a verbal understanding.

## 2017-06-30 DIAGNOSIS — Z0279 Encounter for issue of other medical certificate: Secondary | ICD-10-CM

## 2017-06-30 LAB — NUSWAB VG+, HSV
Atopobium vaginae: HIGH Score — AB
Candida albicans, NAA: NEGATIVE
Candida glabrata, NAA: NEGATIVE
Chlamydia trachomatis, NAA: NEGATIVE
HSV 1 NAA: NEGATIVE
HSV 2 NAA: NEGATIVE
Megasphaera 1: HIGH Score — AB
Neisseria gonorrhoeae, NAA: NEGATIVE
Trich vag by NAA: NEGATIVE

## 2017-06-30 LAB — GC/CHLAMYDIA PROBE AMP
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
## 2017-07-02 ENCOUNTER — Other Ambulatory Visit: Payer: Self-pay | Admitting: Internal Medicine

## 2017-07-02 ENCOUNTER — Telehealth: Payer: Self-pay | Admitting: Internal Medicine

## 2017-07-02 MED ORDER — METRONIDAZOLE 500 MG PO TABS: 500.0000 mg | ORAL_TABLET | Freq: Two times a day (BID) | ORAL | 0 refills | Status: DC

## 2017-07-02 NOTE — Telephone Encounter (Signed)
Patient's [employer]  Short term disability form has been completed.  Please submit and charge patient $50 for form completion (this is the second from related to her LOA)

## 2017-07-02 NOTE — Progress Notes (Signed)
[facility]

## 2017-07-03 ENCOUNTER — Ambulatory Visit: Payer: 59 | Admitting: Family Medicine

## 2017-07-03 ENCOUNTER — Encounter: Payer: Self-pay | Admitting: Family Medicine

## 2017-07-03 VITALS — BP 130/82 | HR 82 | Ht 69.0 in | Wt 208.0 lb

## 2017-07-03 DIAGNOSIS — M999 Biomechanical lesion, unspecified: Secondary | ICD-10-CM | POA: Diagnosis not present

## 2017-07-03 DIAGNOSIS — M5136 Other intervertebral disc degeneration, lumbar region: Secondary | ICD-10-CM | POA: Diagnosis not present

## 2017-07-03 DIAGNOSIS — F4320 Adjustment disorder, unspecified: Secondary | ICD-10-CM | POA: Diagnosis not present

## 2017-07-03 NOTE — Assessment & Plan Note (Addendum)
Decision today to treat with OMT was based on Physical Exam  After verbal consent patient was treated with HVLA, ME, FPR techniques in  thoracic, lumbar and sacral areas  Patient tolerated the procedure well with improvement in symptoms  Patient given exercises, stretches and lifestyle modifications  See medications in patient instructions if given  Patient will follow up in 4 weeks 

## 2017-07-03 NOTE — Assessment & Plan Note (Signed)
Degenerative disc disease of the lumbar spine.  We discussed icing regimen and home exercises.  Discussed which activities of doing which wants to avoid.  Patient is to increase activity as tolerated.  Patient will follow-up again in 4 to 6 weeks

## 2017-07-03 NOTE — Patient Instructions (Signed)
Good to see you  Ice is your friend  Continue the vitamins but stop the K2 when done with the bottle Have tullo check B12 and vitamin D and then we will discuss See me again in 4-6 weeks

## 2017-07-04 ENCOUNTER — Other Ambulatory Visit: Payer: Self-pay | Admitting: Internal Medicine

## 2017-07-05 ENCOUNTER — Telehealth: Payer: Self-pay | Admitting: Internal Medicine

## 2017-07-05 DIAGNOSIS — F4320 Adjustment disorder, unspecified: Secondary | ICD-10-CM | POA: Diagnosis not present

## 2017-07-05 NOTE — Telephone Encounter (Signed)
Form was faxed on 07/03/2016. 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

## 2017-07-05 NOTE — Telephone Encounter (Signed)
I have printed the letter for her return to work on July 2  .  You can send it to [employer]

## 2017-07-06 NOTE — Telephone Encounter (Signed)
Letter has been faxed to [employer]

## 2017-07-12 ENCOUNTER — Encounter: Payer: Self-pay | Admitting: Internal Medicine

## 2017-07-12 ENCOUNTER — Ambulatory Visit (INDEPENDENT_AMBULATORY_CARE_PROVIDER_SITE_OTHER): Payer: 59 | Admitting: Internal Medicine

## 2017-07-12 ENCOUNTER — Other Ambulatory Visit (HOSPITAL_COMMUNITY)
Admission: RE | Admit: 2017-07-12 | Discharge: 2017-07-12 | Disposition: A | Discharge status: Home / Self Care | Payer: 59 | Source: Ambulatory Visit | Attending: Internal Medicine | Admitting: Internal Medicine

## 2017-07-12 VITALS — BP 126/78 | HR 82 | Temp 98.7°F | Resp 14 | Ht 69.0 in | Wt 207.8 lb

## 2017-07-12 DIAGNOSIS — F321 Major depressive disorder, single episode, moderate: Secondary | ICD-10-CM | POA: Diagnosis not present

## 2017-07-12 DIAGNOSIS — Z124 Encounter for screening for malignant neoplasm of cervix: Secondary | ICD-10-CM | POA: Insufficient documentation

## 2017-07-12 DIAGNOSIS — N760 Acute vaginitis: Secondary | ICD-10-CM | POA: Insufficient documentation

## 2017-07-12 DIAGNOSIS — B9689 Other specified bacterial agents as the cause of diseases classified elsewhere: Secondary | ICD-10-CM | POA: Diagnosis not present

## 2017-07-12 MED ORDER — DIAZEPAM 5 MG PO TABS: 5.0000 mg | ORAL_TABLET | Freq: Three times a day (TID) | ORAL | 5 refills | Status: DC | PRN

## 2017-07-12 MED ORDER — DIAZEPAM 5 MG PO TABS: 5.0000 mg | ORAL_TABLET | Freq: Three times a day (TID) | ORAL | 3 refills | Status: DC | PRN
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
## 2017-07-15 DIAGNOSIS — Z124 Encounter for screening for malignant neoplasm of cervix: Secondary | ICD-10-CM | POA: Insufficient documentation

## 2017-07-15 NOTE — Assessment & Plan Note (Signed)
Aggravated by work and home stressors.  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

## 2017-07-15 NOTE — Assessment & Plan Note (Signed)
PAP smear of vaginal cuff was done today. She uses prometrium

## 2017-07-18 ENCOUNTER — Ambulatory Visit
Admission: RE | Admit: 2017-07-18 | Discharge: 2017-07-18 | Disposition: A | Discharge status: Home / Self Care | Payer: 59 | Source: Ambulatory Visit | Attending: Internal Medicine | Admitting: Internal Medicine

## 2017-07-18 ENCOUNTER — Encounter: Payer: Self-pay | Admitting: Internal Medicine

## 2017-07-18 DIAGNOSIS — Z1231 Encounter for screening mammogram for malignant neoplasm of breast: Secondary | ICD-10-CM | POA: Diagnosis not present

## 2017-07-18 LAB — CYTOLOGY - PAP
Bacterial vaginitis: POSITIVE — AB
Candida vaginitis: NEGATIVE
Diagnosis: NEGATIVE
HPV: NOT DETECTED

## 2017-07-19 ENCOUNTER — Encounter: Payer: Self-pay | Admitting: Internal Medicine

## 2017-07-20 ENCOUNTER — Other Ambulatory Visit: Payer: Self-pay | Admitting: Internal Medicine

## 2017-07-20 MED ORDER — CLINDAMYCIN PHOSPHATE 2 % VA CREA: 1.0000 | TOPICAL_CREAM | Freq: Every day | VAGINAL | 0 refills | Status: DC

## 2017-07-21 ENCOUNTER — Other Ambulatory Visit: Payer: Self-pay | Admitting: Internal Medicine

## 2017-07-21 ENCOUNTER — Encounter: Payer: Self-pay | Admitting: Internal Medicine

## 2017-07-21 MED ORDER — CLINDAMYCIN PHOSPHATE (1 DOSE) 2 % VA CREA: TOPICAL_CREAM | VAGINAL | 1 refills | Status: DC

## 2017-08-01 ENCOUNTER — Other Ambulatory Visit: Payer: Self-pay | Admitting: Internal Medicine
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
## 2017-08-07 ENCOUNTER — Ambulatory Visit: Payer: 59 | Admitting: Family Medicine

## 2017-08-07 ENCOUNTER — Encounter: Payer: Self-pay | Admitting: Family Medicine

## 2017-08-07 VITALS — BP 120/90 | HR 84 | Ht 69.0 in | Wt 206.0 lb

## 2017-08-07 DIAGNOSIS — M5136 Other intervertebral disc degeneration, lumbar region: Secondary | ICD-10-CM | POA: Diagnosis not present

## 2017-08-07 DIAGNOSIS — M999 Biomechanical lesion, unspecified: Secondary | ICD-10-CM | POA: Diagnosis not present

## 2017-08-07 NOTE — Patient Instructions (Signed)
Good to see you  Ice is your friend CoQ10 200mg  at night See me again in 6-7 weeks

## 2017-08-07 NOTE — Assessment & Plan Note (Signed)
Decision today to treat with OMT was based on Physical Exam  After verbal consent patient was treated with HVLA, ME, FPR techniques in  thoracic, lumbar and sacral areas  Patient tolerated the procedure well with improvement in symptoms  Patient given exercises, stretches and lifestyle modifications  See medications in patient instructions if given  Patient will follow up in 4-8 weeks 

## 2017-08-07 NOTE — Assessment & Plan Note (Signed)
Known arthritic changes.  Discussed icing regimen and home exercises.  Discussed core strengthening.  Discussed stability.  Discussed which activities to do which wants to avoid.  Patient will increase activity slowly over the course the next several days.  Follow-up again in 4 to 8 weeks

## 2017-08-08 ENCOUNTER — Encounter: Payer: Self-pay | Admitting: Internal Medicine

## 2017-08-09 ENCOUNTER — Other Ambulatory Visit: Payer: Self-pay

## 2017-08-09 MED ORDER — UROGESIC-BLUE 81.6 MG PO TABS: 1.0000 | ORAL_TABLET | Freq: Three times a day (TID) | ORAL | 1 refills | Status: DC

## 2017-08-10 ENCOUNTER — Other Ambulatory Visit: Payer: Self-pay | Admitting: Family Medicine

## 2017-08-10 ENCOUNTER — Other Ambulatory Visit: Payer: Self-pay

## 2017-08-10 MED ORDER — FLUCONAZOLE 150 MG PO TABS: 150.0000 mg | ORAL_TABLET | Freq: Once | ORAL | 1 refills | Status: AC

## 2017-08-10 NOTE — Telephone Encounter (Signed)
Refill done.  

## 2017-08-12 ENCOUNTER — Other Ambulatory Visit: Payer: Self-pay | Admitting: Certified Nurse Midwife

## 2017-08-12 MED ORDER — TERCONAZOLE 0.4 % VA CREA: 1.0000 | TOPICAL_CREAM | Freq: Every day | VAGINAL | 0 refills | Status: DC

## 2017-08-12 MED ORDER — TERCONAZOLE 0.8 % VA CREA: 1.0000 | TOPICAL_CREAM | Freq: Every day | VAGINAL | 0 refills | Status: DC

## 2017-08-14 DIAGNOSIS — F4320 Adjustment disorder, unspecified: Secondary | ICD-10-CM | POA: Diagnosis not present

## 2017-08-28 DIAGNOSIS — H52223 Regular astigmatism, bilateral: Secondary | ICD-10-CM | POA: Diagnosis not present

## 2017-08-28 DIAGNOSIS — E119 Type 2 diabetes mellitus without complications: Secondary | ICD-10-CM | POA: Diagnosis not present

## 2017-08-28 DIAGNOSIS — H524 Presbyopia: Secondary | ICD-10-CM | POA: Diagnosis not present

## 2017-08-28 DIAGNOSIS — H5213 Myopia, bilateral: Secondary | ICD-10-CM | POA: Diagnosis not present

## 2017-08-28 DIAGNOSIS — Z7984 Long term (current) use of oral hypoglycemic drugs: Secondary | ICD-10-CM | POA: Diagnosis not present

## 2017-09-07 ENCOUNTER — Other Ambulatory Visit: Payer: Self-pay | Admitting: Internal Medicine
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
## 2017-09-18 ENCOUNTER — Ambulatory Visit (INDEPENDENT_AMBULATORY_CARE_PROVIDER_SITE_OTHER): Payer: 59 | Admitting: Family Medicine

## 2017-09-18 ENCOUNTER — Encounter: Payer: Self-pay | Admitting: Family Medicine

## 2017-09-18 VITALS — BP 120/78 | HR 79 | Ht 69.0 in | Wt 211.0 lb

## 2017-09-18 DIAGNOSIS — M5136 Other intervertebral disc degeneration, lumbar region: Secondary | ICD-10-CM

## 2017-09-18 DIAGNOSIS — M999 Biomechanical lesion, unspecified: Secondary | ICD-10-CM

## 2017-09-18 DIAGNOSIS — G5603 Carpal tunnel syndrome, bilateral upper limbs: Secondary | ICD-10-CM

## 2017-09-18 DIAGNOSIS — M5412 Radiculopathy, cervical region: Secondary | ICD-10-CM | POA: Diagnosis not present

## 2017-09-18 DIAGNOSIS — M51369 Other intervertebral disc degeneration, lumbar region without mention of lumbar back pain or lower extremity pain: Secondary | ICD-10-CM

## 2017-09-18 NOTE — Assessment & Plan Note (Signed)
Monitoring may need possible injection.  Last injection was back in June 2018.

## 2017-09-18 NOTE — Assessment & Plan Note (Signed)
Less symptoms at this time but is concerning.  History of carpal tunnel and will consider injection at follow-up.

## 2017-09-18 NOTE — Patient Instructions (Signed)
Good to see you  Ice 20 minutes 2 times daily. Usually after activity and before bed. Stay active Lets see how you are doing in 4 weeks. If not better lets try a carpal tunnel injection  Then consider epidural as well.  Maybe just give it time. See me again in 4 weeks

## 2017-09-18 NOTE — Assessment & Plan Note (Signed)
Decision today to treat with OMT was based on Physical Exam  After verbal consent patient was treated with HVLA, ME, FPR techniques in  thoracic, rib, lumbar and sacral areas  Patient tolerated the procedure well with improvement in symptoms  Patient given exercises, stretches and lifestyle modifications  See medications in patient instructions if given  Patient will follow up in 4 weeks

## 2017-09-18 NOTE — Assessment & Plan Note (Signed)
Degenerative disc disease lumbar spine.  Has had neck pain.  Concern the neck pain is also secondary to radicular symptoms giving patient the numbness in the finger.  Hopefully patient will continue to improve.  Patient is only 3 months out since surgery.  Hopefully will continue healing and encourage patient to take the vitamin supplementation.  Follow-up again in 4 weeks.

## 2017-09-26 ENCOUNTER — Other Ambulatory Visit: Payer: Self-pay | Admitting: Internal Medicine

## 2017-10-12 ENCOUNTER — Other Ambulatory Visit: Payer: Self-pay | Admitting: Internal Medicine

## 2017-10-13 MED ORDER — ZOLPIDEM TARTRATE 10 MG PO TABS: ORAL_TABLET | ORAL | 5 refills | Status: DC

## 2017-10-13 NOTE — Telephone Encounter (Signed)
Refilled: 03/31/2017 Last OV: 07/12/2017 Next OV: not scheduled
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
## 2017-10-16 ENCOUNTER — Encounter: Payer: Self-pay | Admitting: Family Medicine

## 2017-10-16 ENCOUNTER — Ambulatory Visit (INDEPENDENT_AMBULATORY_CARE_PROVIDER_SITE_OTHER): Payer: 59 | Admitting: Family Medicine

## 2017-10-16 VITALS — BP 124/88 | HR 70 | Ht 69.0 in | Wt 209.0 lb

## 2017-10-16 DIAGNOSIS — M999 Biomechanical lesion, unspecified: Secondary | ICD-10-CM

## 2017-10-16 DIAGNOSIS — M5136 Other intervertebral disc degeneration, lumbar region: Secondary | ICD-10-CM | POA: Diagnosis not present

## 2017-10-16 MED ORDER — NABUMETONE 750 MG PO TABS: 750.0000 mg | ORAL_TABLET | Freq: Every day | ORAL | 1 refills | Status: DC

## 2017-10-16 NOTE — Assessment & Plan Note (Signed)
Decision today to treat with OMT was based on Physical Exam  After verbal consent patient was treated with HVLA, ME, FPR techniques in  thoracic, lumbar and sacral areas  Patient tolerated the procedure well with improvement in symptoms  Patient given exercises, stretches and lifestyle modifications  See medications in patient instructions if given  Patient will follow up in 4 weeks 

## 2017-10-16 NOTE — Assessment & Plan Note (Signed)
I believe the patient's pain is more secondary to the lumbar than not true thoracic.  Patient does have some mild degenerative disc disease noted noted.  We discussed icing regimen and home exercises.  Discussed which activities to do which wants to avoid.  Increase activity slowly over the course the next several days again.  Do not feel that any injections are necessary at the time.  Patient with worsening neck pain avoided significant amount of manipulation.  Patient is following up with her neurosurgeon in near future.  Follow-up with me again in 4 weeks

## 2017-10-16 NOTE — Patient Instructions (Addendum)
Good to see you  Ice is your friend Stay active Nabumatone daily and see how it goes. If hurts your stomahc go back to the IBU Essential amino acid at least 1 time a day and usually after activity.  Any dose is better than nothing.  Calcium pyruvate 1500mg  daily and lets see how it goes but will take 2 months See me again in 4ish weeks

## 2017-11-13 ENCOUNTER — Ambulatory Visit: Payer: Self-pay | Admitting: Family Medicine
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
## 2017-11-20 ENCOUNTER — Other Ambulatory Visit: Payer: Self-pay

## 2017-11-20 ENCOUNTER — Ambulatory Visit: Payer: Self-pay

## 2017-11-20 ENCOUNTER — Encounter: Payer: Self-pay | Admitting: Family Medicine

## 2017-11-20 ENCOUNTER — Ambulatory Visit (INDEPENDENT_AMBULATORY_CARE_PROVIDER_SITE_OTHER): Payer: 59 | Admitting: Family Medicine

## 2017-11-20 VITALS — BP 110/72 | HR 66 | Ht 69.0 in | Wt 210.0 lb

## 2017-11-20 DIAGNOSIS — G8929 Other chronic pain: Secondary | ICD-10-CM | POA: Diagnosis not present

## 2017-11-20 DIAGNOSIS — S86892A Other injury of other muscle(s) and tendon(s) at lower leg level, left leg, initial encounter: Secondary | ICD-10-CM

## 2017-11-20 DIAGNOSIS — M25562 Pain in left knee: Secondary | ICD-10-CM | POA: Diagnosis not present

## 2017-11-20 DIAGNOSIS — M999 Biomechanical lesion, unspecified: Secondary | ICD-10-CM | POA: Diagnosis not present

## 2017-11-20 DIAGNOSIS — M5412 Radiculopathy, cervical region: Secondary | ICD-10-CM

## 2017-11-20 MED ORDER — NAPROXEN-ESOMEPRAZOLE 500-20 MG PO TBEC: 1.0000 | DELAYED_RELEASE_TABLET | Freq: Two times a day (BID) | ORAL | 6 refills | Status: DC

## 2017-11-20 MED ORDER — NITROGLYCERIN 0.2 MG/HR TD PT24: MEDICATED_PATCH | TRANSDERMAL | 1 refills | Status: DC

## 2017-11-20 NOTE — Assessment & Plan Note (Signed)
Patient does have some very mild loose body still noted in the area.  We discussed different strapping that could be beneficial.  Discussed restarting the nitroglycerin and was sent in today.  Follow-up again in 4 to 6 weeks.  Also could potentially consider the surgical intervention for this or potentially PRP

## 2017-11-20 NOTE — Assessment & Plan Note (Signed)
Decision today to treat with OMT was based on Physical Exam  After verbal consent patient was treated with HVLA, ME, FPR techniques in , thoracic, lumbar and sacral areas  Patient tolerated the procedure well with improvement in symptoms  Patient given exercises, stretches and lifestyle modifications  See medications in patient instructions if given  Patient will follow up in 4 weeks 

## 2017-11-20 NOTE — Patient Instructions (Signed)
Good to see you  Ice is your friend Nitroglycerin Protocol   Apply 1/4 nitroglycerin patch to affected area daily.  Change position of patch within the affected area every 24 hours.  You may experience a headache during the first 1-2 weeks of using the patch, these should subside.  If you experience headaches after beginning nitroglycerin patch treatment, you may take your preferred over the counter pain reliever.  Another side effect of the nitroglycerin patch is skin irritation or rash related to patch adhesive.  Please notify our office if you develop more severe headaches or rash, and stop the patch.  Tendon healing with nitroglycerin patch may require 12 to 24 weeks depending on the extent of injury.  Men should not use if taking Viagra, Cialis, or Levitra.   Do not use if you have migraines or rosacea.   We ordered the epidural  Try a patella strap on the knee and see if that helps with your walking  See me again in 6ish weeks for one more Audrie Gallus this year

## 2017-11-20 NOTE — Assessment & Plan Note (Signed)
Having some mild sounds that is concerning for more of a radiculopathy.  Discussed with patient again at great length.  Patient will try an epidural again.  Has responded previously.  Does have known carpal tunnel and may need injections as well.  Discussed icing regimen and home exercises.

## 2017-11-21 ENCOUNTER — Encounter: Payer: Self-pay | Admitting: Family Medicine

## 2017-11-24 ENCOUNTER — Other Ambulatory Visit: Payer: Self-pay | Admitting: Family Medicine

## 2017-11-27 ENCOUNTER — Encounter: Payer: Self-pay | Admitting: Student in an Organized Health Care Education/Training Program

## 2017-11-27 ENCOUNTER — Ambulatory Visit
Admission: RE | Admit: 2017-11-27 | Discharge: 2017-11-27 | Disposition: A | Discharge status: Home / Self Care | Payer: 59 | Source: Ambulatory Visit | Attending: Student in an Organized Health Care Education/Training Program | Admitting: Student in an Organized Health Care Education/Training Program

## 2017-11-27 ENCOUNTER — Ambulatory Visit (HOSPITAL_BASED_OUTPATIENT_CLINIC_OR_DEPARTMENT_OTHER): Payer: 59 | Admitting: Student in an Organized Health Care Education/Training Program

## 2017-11-27 VITALS — BP 122/74 | HR 74 | Temp 98.4°F | Resp 16 | Ht 69.0 in | Wt 208.0 lb

## 2017-11-27 DIAGNOSIS — M5412 Radiculopathy, cervical region: Secondary | ICD-10-CM | POA: Insufficient documentation

## 2017-11-27 DIAGNOSIS — M9981 Other biomechanical lesions of cervical region: Secondary | ICD-10-CM

## 2017-11-27 DIAGNOSIS — M4802 Spinal stenosis, cervical region: Secondary | ICD-10-CM | POA: Diagnosis not present

## 2017-11-27 DIAGNOSIS — Z981 Arthrodesis status: Secondary | ICD-10-CM | POA: Insufficient documentation

## 2017-11-27 DIAGNOSIS — R200 Anesthesia of skin: Secondary | ICD-10-CM | POA: Insufficient documentation

## 2017-11-27 MED ORDER — DEXAMETHASONE SODIUM PHOSPHATE 10 MG/ML IJ SOLN
10.0000 mg | Freq: Once | INTRAMUSCULAR | Status: AC
  Administered 2017-11-27: 10 mg
  Filled 2017-11-27: qty 1

## 2017-11-27 MED ORDER — FENTANYL CITRATE (PF) 100 MCG/2ML IJ SOLN: 25.0000 ug | INTRAMUSCULAR | Status: DC | PRN

## 2017-11-27 MED ORDER — LIDOCAINE HCL 2 % IJ SOLN
10.0000 mL | Freq: Once | INTRAMUSCULAR | Status: AC
  Administered 2017-11-27: 400 mg
  Filled 2017-11-27: qty 20

## 2017-11-27 MED ORDER — LACTATED RINGERS IV SOLN: 1000.0000 mL | Freq: Once | INTRAVENOUS | Status: DC

## 2017-11-27 MED ORDER — ROPIVACAINE HCL 2 MG/ML IJ SOLN
1.0000 mL | Freq: Once | INTRAMUSCULAR | Status: AC
  Administered 2017-11-27: 10 mL via EPIDURAL
  Filled 2017-11-27: qty 10

## 2017-11-27 MED ORDER — IOPAMIDOL (ISOVUE-M 200) INJECTION 41%
10.0000 mL | Freq: Once | INTRAMUSCULAR | Status: AC
  Administered 2017-11-27: 10 mL via EPIDURAL
  Filled 2017-11-27: qty 10

## 2017-11-27 MED ORDER — SODIUM CHLORIDE 0.9% FLUSH
1.0000 mL | Freq: Once | INTRAVENOUS | Status: AC
  Administered 2017-11-27: 10 mL

## 2017-11-27 NOTE — Progress Notes (Signed)
Safety precautions to be maintained throughout the outpatient stay will include: orient to surroundings, keep bed in low position, maintain call bell within reach at all times, provide assistance with transfer out of bed and ambulation.  

## 2017-11-27 NOTE — Progress Notes (Signed)
Patient's Name: Patricia [name]  MRN: [id number]  Referring Provider: [name], DO  DOB: September 30, 1970  PCP: [name], MD  DOS: 11/27/2017  Note by: Gillis Santa, MD  Service setting: Ambulatory outpatient  Specialty: Interventional Pain Management  Patient type: Established  Location: [facility] (AMB) Pain Management Facility  Visit type: Interventional Procedure   Primary Reason for Visit: Interventional Pain Management Treatment. CC: Neck Pain (middle )  Procedure:  Anesthesia, Analgesia, Anxiolysis:  Type: Therapeutic, Inter-Laminar, Epidural Steroid Injection Region: Posterior Cervico-thoracic Region Level: T2/T3 Laterality: Left-Sided Paramedial  Type: Local Anesthesia Local Anesthetic: Lidocaine 1% Route: Infiltration (Stebbins/IM) IV Access: Secured Sedation: Meaningful verbal contact was maintained at all times during the procedure  Indication(s): Analgesia and Anxiety   Indications: 1. Cervical radiculopathy   2. Neuroforaminal stenosis of cervical spine    Pain Score: Pre-procedure: 3 /10 Post-procedure: 3 /10   Patient is status post left C6-C7 epidural steroid injection with me on 01/23/2017.  Since her visit with me, patient has had a ACDF from C4-C7.  She is having numbness in her left hand and pain in her periscapular region and shoulder region.  She is interested in repeating cervical epidural steroid injection which was recommended to her by her neurosurgeon.  I had a discussion with the patient that given her surgical hardware, we may need to do an epidural steroid injection below her fusion so that we can safely see our needle entering her epidural space.  Patient in agreement with plan.  Pre-op Assessment:  Patricia [name] is a 47 y.o. (year old), female patient, seen today for interventional treatment. She  has a past surgical history that includes Abdominal hysterectomy (Dec 2012); Tonsillectomy (May 2012); transthoracic echocardiogram (04/2014); 48 hour  Holter Monitor (05/04/2014); Breast lumpectomy (Right, 1991 or 1992); Breast excisional biopsy (Right, 1990); and ACDL (02/06/2017). Patricia [name] has a current medication list which includes the following prescription(s): 5-htp, aripiprazole, bupropion, cyclobenzaprine, diazepam, ibuprofen, loratadine, losartan, metformin, montelukast, nitroglycerin, pyridoxine, zolpidem, amlodipine, ashwagandha, clindamycin, clindamycin phosphate (1 dose), hydrochlorothiazide, urogesic-blue, nabumetone, naproxen-esomeprazole, pramipexole, terconazole, vitamin d (ergocalciferol), and zolpidem, and the following Facility-Administered Medications: fentanyl and lactated ringers. Her primarily concern today is the Neck Pain (middle )  Initial Vital Signs: Blood pressure 134/89, pulse 99, temperature (!) 97.2 F (36.2 C), resp. rate 16, height 5\' 9"  (1.753 m), weight 216 lb (98 kg), SpO2 98 %. BMI: Estimated body mass index is 30.72 kg/m as calculated from the following:   Height as of this encounter: 5\' 9"  (1.753 m).   Weight as of this encounter: 208 lb (94.3 kg).  Risk Assessment: Allergies: Reviewed. She is allergic to clarithromycin; tramadol; and zithromax [azithromycin].  Allergy Precautions: None required Coagulopathies: Reviewed. None identified.  Blood-thinner therapy: None at this time Active Infection(s): Reviewed. None identified. Patricia [name] is afebrile  Site Confirmation: Patricia [name] was asked to confirm the procedure and laterality before marking the site Procedure checklist: Completed Consent: Before the procedure and under the influence of no sedative(s), amnesic(s), or anxiolytics, the patient was informed of the treatment options, risks and possible complications. To fulfill our ethical and legal obligations, as recommended by the American Medical Association's Code of Ethics, I have informed the patient of my clinical impression; the nature and purpose of the treatment or procedure; the risks,  benefits, and possible complications of the intervention; the alternatives, including doing nothing; the risk(s) and benefit(s) of the alternative treatment(s) or procedure(s); and the risk(s) and benefit(s) of doing nothing. The patient  was provided information about the general risks and possible complications associated with the procedure. These may include, but are not limited to: failure to achieve desired goals, infection, bleeding, organ or nerve damage, allergic reactions, paralysis, and death. In addition, the patient was informed of those risks and complications associated to Spine-related procedures, such as failure to decrease pain; infection (i.e.: Meningitis, epidural or intraspinal abscess); bleeding (i.e.: epidural hematoma, subarachnoid hemorrhage, or any other type of intraspinal or peri-dural bleeding); organ or nerve damage (i.e.: Any type of peripheral nerve, nerve root, or spinal cord injury) with subsequent damage to sensory, motor, and/or autonomic systems, resulting in permanent pain, numbness, and/or weakness of one or several areas of the body; allergic reactions; (i.e.: anaphylactic reaction); and/or death. Furthermore, the patient was informed of those risks and complications associated with the medications. These include, but are not limited to: allergic reactions (i.e.: anaphylactic or anaphylactoid reaction(s)); adrenal axis suppression; blood sugar elevation that in diabetics may result in ketoacidosis or comma; water retention that in patients with history of congestive heart failure may result in shortness of breath, pulmonary edema, and decompensation with resultant heart failure; weight gain; swelling or edema; medication-induced neural toxicity; particulate matter embolism and blood vessel occlusion with resultant organ, and/or nervous system infarction; and/or aseptic necrosis of one or more joints. Finally, the patient was informed that Medicine is not an exact science;  therefore, there is also the possibility of unforeseen or unpredictable risks and/or possible complications that may result in a catastrophic outcome. The patient indicated having understood very clearly. We have given the patient no guarantees and we have made no promises. Enough time was given to the patient to ask questions, all of which were answered to the patient's satisfaction. Ms. Bourquin has indicated that she wanted to continue with the procedure. Attestation: I, the ordering provider, attest that I have discussed with the patient the benefits, risks, side-effects, alternatives, likelihood of achieving goals, and potential problems during recovery for the procedure that I have provided informed consent. Date: 11/27/2017; Time: 2:43 PM  Pre-Procedure Preparation:  Monitoring: As per clinic protocol. Respiration, ETCO2, SpO2, BP, heart rate and rhythm monitor placed and checked for adequate function Safety Precautions: Patient was assessed for positional comfort and pressure points before starting the procedure. Time-out: I initiated and conducted the "Time-out" before starting the procedure, as per protocol. The patient was asked to participate by confirming the accuracy of the "Time Out" information. Verification of the correct person, site, and procedure were performed and confirmed by me, the nursing staff, and the patient. "Time-out" conducted as per Joint Commission's Universal Protocol (UP.01.01.01). "Time-out" Date & Time: 11/27/2017; 1239 hrs.  Description of Procedure Process:   Position: Prone with head of the table was raised to facilitate breathing. Target Area: For Epidural Steroid injections the target is the interlaminar space, initially targeting the lower border of the superior vertebral body lamina. Approach: Paramedial approach. Area Prepped: Entire PosteriorCervical Region Prepping solution: ChloraPrep (2% chlorhexidine gluconate and 70% isopropyl alcohol) Safety  Precautions: Aspiration looking for blood return was conducted prior to all injections. At no point did we inject any substances, as a needle was being advanced. No attempts were made at seeking any paresthesias. Safe injection practices and needle disposal techniques used. Medications properly checked for expiration dates. SDV (single dose vial) medications used. Description of the Procedure: Protocol guidelines were followed. The procedure needle was introduced through the skin, ipsilateral to the reported pain, and advanced to the target area. Bone  was contacted and the needle walked caudad, until the lamina was cleared. The epidural space was identified using "loss-of-resistance technique" with 2-3 ml of PF-NaCl (0.9% NSS), in a 5cc LOR glass syringe. Vitals:   11/27/17 1150 11/27/17 1243 11/27/17 1246  BP: 129/90 132/84 122/74  Pulse: 74    Resp: 16 15 16   Temp: 98.4 F (36.9 C)    TempSrc: Oral    SpO2: 99% 97% 99%  Weight: 208 lb (94.3 kg)    Height: 5\' 9"  (1.753 m)      Start Time: 1239 hrs. End Time: 1244 hrs. Materials:  Needle(s) Type: Epidural needle Gauge: 22G Length: 3.5-in Medication(s): We administered iopamidol, dexamethasone, ropivacaine (PF) 2 mg/mL (0.2%), sodium chloride flush, and lidocaine. Please see chart orders for dosing details. 4 cc solution made of 2 cc of normal saline preservative-free, 1 cc of 0.2% ropivacaine, 1 cc of Decadron 10 mg/cc. Imaging Guidance (Spinal):  Type of Imaging Technique: Fluoroscopy Guidance (Spinal) Indication(s): Assistance in needle guidance and placement for procedures requiring needle placement in or near specific anatomical locations not easily accessible without such assistance. Exposure Time: Please see nurses notes. Contrast: Before injecting any contrast, we confirmed that the patient did not have an allergy to iodine, shellfish, or radiological contrast. Once satisfactory needle placement was completed at the desired level,  radiological contrast was injected. Contrast injected under live fluoroscopy. No contrast complications. See chart for type and volume of contrast used. Fluoroscopic Guidance: I was personally present during the use of fluoroscopy. "Tunnel Vision Technique" used to obtain the best possible view of the target area. Parallax error corrected before commencing the procedure. "Direction-depth-direction" technique used to introduce the needle under continuous pulsed fluoroscopy. Once target was reached, antero-posterior, oblique, and lateral fluoroscopic projection used confirm needle placement in all planes. Images permanently stored in EMR. Interpretation: I personally interpreted the imaging intraoperatively. Adequate needle placement confirmed in multiple planes. Appropriate spread of contrast into desired area was observed. No evidence of afferent or efferent intravascular uptake. No intrathecal or subarachnoid spread observed. Permanent images saved into the patient's record.  Antibiotic Prophylaxis:  Indication(s): None identified Antibiotic given: None  Post-operative Assessment:  EBL: None Complications: No immediate post-treatment complications observed by team, or reported by patient. Note: The patient tolerated the entire procedure well. A repeat set of vitals were taken after the procedure and the patient was kept under observation following institutional policy, for this type of procedure. Post-procedural neurological assessment was performed, showing return to baseline, prior to discharge. The patient was provided with post-procedure discharge instructions, including a section on how to identify potential problems. Should any problems arise concerning this procedure, the patient was given instructions to immediately contact us, at any time, without hesitation. In any case, we plan to contact the patient by telephone for a follow-up status report regarding this interventional procedure. Comments:   No additional relevant information. 5 out of 5 left upper extremity strength: Shoulder abduction, elbow flexion, elbow extension, thumb extension (at baseline) 5 out of 5 strength right upper extremity: Shoulder abduction, elbow flexion, elbow extension, thumb extension.  (At baseline) Plan of Care   Imaging Orders     DG C-Arm 1-60 Min-No Report Procedure Orders    No procedure(s) ordered today    Continue meds as previously prescribed, increase Gabapentin to 300/300/600  Medications ordered for procedure: Meds ordered this encounter  Medications  . lactated ringers infusion 1,000 mL  . fentaNYL (SUBLIMAZE) injection 25-100 mcg    Make  sure Narcan is available in the pyxis when using this medication. In the event of respiratory depression (RR< 8/min): Titrate NARCAN (naloxone) in increments of 0.1 to 0.2 mg IV at 2-3 minute intervals, until desired degree of reversal.  . iopamidol (ISOVUE-M) 41 % intrathecal injection 10 mL  . dexamethasone (DECADRON) injection 10 mg  . ropivacaine (PF) 2 mg/mL (0.2%) (NAROPIN) injection 1 mL  . sodium chloride flush (NS) 0.9 % injection 1 mL  . lidocaine (XYLOCAINE) 2 % (with pres) injection 200 mg   Medications administered: We administered iopamidol, dexamethasone, ropivacaine (PF) 2 mg/mL (0.2%), sodium chloride flush, and lidocaine.  See the medical record for exact dosing, route, and time of administration.  New Prescriptions   No medications on file   Disposition: Discharge home  Discharge Date & Time: 11/27/2017; 1300 hrs.   Physician-requested Follow-up: Return in about 31 days (around 12/28/2017) for Post Procedure Evaluation. Future Appointments  Date Time Provider [place]  12/25/2017  1:30 PM Gillis Santa, MD [facility] None  01/01/2018 12:45 PM [name], DO [facility]   Primary Care Physician: [name], MD Location: [facility] Note by: Gillis Santa, MD Date:  11/27/2017; Time: 1:39 PM  Disclaimer:  Medicine is not an exact science. The only guarantee in medicine is that nothing is guaranteed. It is important to note that the decision to proceed with this intervention was based on the information collected from the patient. The Data and conclusions were drawn from the patient's questionnaire, the interview, and the physical examination. Because the information was provided in large part by the patient, it cannot be guaranteed that it has not been purposely or unconsciously manipulated. Every effort has been made to obtain as much relevant data as possible for this evaluation. It is important to note that the conclusions that lead to this procedure are derived in large part from the available data. Always take into account that the treatment will also be dependent on availability of resources and existing treatment guidelines, considered by other Pain Management Practitioners as being common knowledge and practice, at the time of the intervention. For Medico-Legal purposes, it is also important to point out that variation in procedural techniques and pharmacological choices are the acceptable norm. The indications, contraindications, technique, and results of the above procedure should only be interpreted and judged by a Board-Certified Interventional Pain Specialist with extensive familiarity and expertise in the same exact procedure and technique.

## 2017-11-27 NOTE — Patient Instructions (Signed)

## 2017-11-28 ENCOUNTER — Telehealth: Payer: Self-pay

## 2017-11-28 NOTE — Telephone Encounter (Signed)
Patient states she is doing good after procedure.

## 2017-12-05 DIAGNOSIS — I10 Essential (primary) hypertension: Secondary | ICD-10-CM | POA: Diagnosis not present

## 2017-12-05 DIAGNOSIS — M542 Cervicalgia: Secondary | ICD-10-CM | POA: Diagnosis not present

## 2017-12-05 DIAGNOSIS — Z6830 Body mass index (BMI) 30.0-30.9, adult: Secondary | ICD-10-CM | POA: Diagnosis not present

## 2017-12-05 DIAGNOSIS — M5412 Radiculopathy, cervical region: Secondary | ICD-10-CM | POA: Diagnosis not present

## 2017-12-06 ENCOUNTER — Ambulatory Visit (INDEPENDENT_AMBULATORY_CARE_PROVIDER_SITE_OTHER): Payer: Self-pay | Admitting: Physician Assistant

## 2017-12-06 VITALS — BP 125/80 | HR 79 | Temp 98.3°F | Resp 18

## 2017-12-06 DIAGNOSIS — R0981 Nasal congestion: Secondary | ICD-10-CM

## 2017-12-06 DIAGNOSIS — R05 Cough: Secondary | ICD-10-CM

## 2017-12-06 DIAGNOSIS — J069 Acute upper respiratory infection, unspecified: Secondary | ICD-10-CM

## 2017-12-06 DIAGNOSIS — R059 Cough, unspecified: Secondary | ICD-10-CM

## 2017-12-06 DIAGNOSIS — J040 Acute laryngitis: Secondary | ICD-10-CM

## 2017-12-06 MED ORDER — PREDNISONE 20 MG PO TABS: 40.0000 mg | ORAL_TABLET | Freq: Every day | ORAL | 0 refills | Status: AC

## 2017-12-06 MED ORDER — PROMETHAZINE-PHENYLEPHRINE 6.25-5 MG/5ML PO SYRP: 5.0000 mL | ORAL_SOLUTION | Freq: Four times a day (QID) | ORAL | 0 refills | Status: DC | PRN

## 2017-12-06 MED ORDER — AZELASTINE HCL 0.1 % NA SOLN: 1.0000 | Freq: Two times a day (BID) | NASAL | 12 refills | Status: DC

## 2017-12-06 NOTE — Patient Instructions (Signed)
Thank you for choosing [facility] for your health care needs.  You have been diagnosed with an upper respiratory infection (a cold). You have developed sinus pressure/congestion, laryngitis (loss/hoarse voice), and cough.  Take medication as prescribed: - promethazine-phenylephrine (PROMETHAZINE VC) 6.25-5 MG/5ML SYRP; Take 5 mLs by mouth every 6 (six) hours as needed for congestion.  Dispense: 280 mL; Refill: 0  - predniSONE (DELTASONE) 20 MG tablet; Take 2 tablets (40 mg total) by mouth daily with breakfast for 5 days.  Dispense: 10 tablet; Refill: 0  - azelastine (ASTELIN) 0.1 % nasal spray; Place 1 spray into both nostrils 2 (two) times daily. Use in each nostril as directed  Dispense: 30 mL; Refill: 12  Increase fluids. Rest. May use saline nasal spray and/or NettiPot. Continue Singulair and Claritin (allergy medication).  Follow-up at [facility] in 4-5 days if not improving. Sooner with any worsening symptoms.  Upper Respiratory Infection, Adult Most upper respiratory infections (URIs) are caused by a virus. A URI affects the nose, throat, and upper air passages. The most common type of URI is often called "the common cold." Follow these instructions at home:  Take medicines only as told by your doctor.  Gargle warm saltwater or take cough drops to comfort your throat as told by your doctor.  Use a warm mist humidifier or inhale steam from a shower to increase air moisture. This may make it easier to breathe.  Drink enough fluid to keep your pee (urine) clear or pale yellow.  Eat soups and other clear broths.  Have a healthy diet.  Rest as needed.  Go back to work when your fever is gone or your doctor says it is okay. ? You may need to stay home longer to avoid giving your URI to others. ? You can also wear a face mask and wash your hands often to prevent spread of the virus.  Use your inhaler more if you have asthma.  Do not use any tobacco products, including  cigarettes, chewing tobacco, or electronic cigarettes. If you need help quitting, ask your doctor. Contact a doctor if:  You are getting worse, not better.  Your symptoms are not helped by medicine.  You have chills.  You are getting more short of breath.  You have brown or red mucus.  You have yellow or brown discharge from your nose.  You have pain in your face, especially when you bend forward.  You have a fever.  You have puffy (swollen) neck glands.  You have pain while swallowing.  You have white areas in the back of your throat. Get help right away if:  You have very bad or constant: ? Headache. ? Ear pain. ? Pain in your forehead, behind your eyes, and over your cheekbones (sinus pain). ? Chest pain.  You have long-lasting (chronic) lung disease and any of the following: ? Wheezing. ? Long-lasting cough. ? Coughing up blood. ? A change in your usual mucus.  You have a stiff neck.  You have changes in your: ? Vision. ? Hearing. ? Thinking. ? Mood. This information is not intended to replace advice given to you by your health care provider. Make sure you discuss any questions you have with your health care provider. Document Released: 06/15/2007 Document Revised: 08/30/2015 Document Reviewed: 04/03/2013 Elsevier Interactive Patient Education  2018 [employer].

## 2017-12-06 NOTE — Progress Notes (Signed)
Patient ID: Patricia [name] DOB: [birth year] AGE: 47 y.o. MRN: [id number]   PCP: [name], MD   Chief Complaint:  Chief Complaint  Patient presents with  . Sinus Problem    X 3 DAYS, PRESSURE, PAIN  . Dental Pain    X 3 DAYS  . Headache    X 3 DAYS  . Nasal Congestion    X 3 DAYS   . Cough    X 2 DAYS, CHEST CONGESTION, HURTS WHEN SHE COUGH     Subjective:    HPI:  Patricia [name] is a 47 y.o. female presents for evaluation  Chief Complaint  Patient presents with  . Sinus Problem    X 3 DAYS, PRESSURE, PAIN  . Dental Pain    X 3 DAYS  . Headache    X 3 DAYS  . Nasal Congestion    X 3 DAYS   . Cough    X 2 DAYS, CHEST CONGESTION, HURTS WHEN SHE COUGH    47 year old female presents to [facility] with 4 day history of URI symptoms. Began with sore/scratchy throat. Then developed rhinorrhea. Following day developed nasal congestion, bilateral maxillary sinus pressure/congestion, bilateral ear fullness/pressure. Yesterday developed cough. Cough coarse and productive. Associated chest congestion sensation and chest tightness. Sinus pressure worsening. Radiates to upper teeth. Has taken OTC Dayquil and Nyquil with moderate symptom relief; feels good for a few hours, then symptoms return. Denies fever, chills, headache, ear pain, chest pain, SOB, wheezing. No asthma history. Patient has previously been prescribed albuterol inhaler with bronchitis. Patient with year-round allergies; on Claritin and Singulair daily. Patient is a type 2 diabetic; currently controlled on Metformin, last A1C 6.3 (06/07/2017).   A complete, at least 10 system review of symptoms was performed, pertinent positives and negatives as mentioned in HPI, otherwise negative.  The following portions of the patient's history were reviewed and updated as appropriate: allergies, current medications and past medical history.  Patient Active Problem List   Diagnosis Date Noted  .  Screening for cervical cancer 07/15/2017  . Prediabetes 06/13/2017  . Left groin pain 06/06/2017  . S/P cervical discectomy 03/05/2017  . Cervical radiculopathy at C8 01/19/2017  . Insomnia due to psychological stress 09/20/2016  . Loose body(ies), joint, elbow, right 08/08/2016  . Patellar tendon avulsion, left, initial encounter 08/08/2016  . History of concussion 08/08/2016  . Family history of secondary breast cancer 06/08/2016  . Obesity (BMI 30.0-34.9) 06/08/2016  . Carpal tunnel syndrome 12/28/2015  . Radiculopathy affecting upper extremity 11/23/2015  . Nonallopathic lesion of lumbosacral region 06/29/2015  . Nonallopathic lesion of thoracic region 06/29/2015  . Nonallopathic lesion of sacral region 06/29/2015  . Vaginal high risk HPV DNA test positive 06/01/2015  . Lumbar degenerative disc disease 03/30/2015  . Trochanteric bursitis of right hip 03/07/2015  . Premature atrial contractions   . S/P hysterectomy 05/13/2013  . Diabetes mellitus without complication ([place]) 02/54/2706  . Restless legs 09/14/2012  . Major depressive disorder, single episode 09/14/2012  . Visit for preventive health examination 05/08/2012  . Essential hypertension 04/17/2011  . Thyroid disease     Allergies  Allergen Reactions  . Clarithromycin   . Tramadol   . Zithromax [Azithromycin]     Current Outpatient Medications on File Prior to Visit  Medication Sig Dispense Refill  . amLODipine (NORVASC) 5 MG tablet Take 5 mg by mouth.    . ARIPiprazole (ABILIFY) 2 MG tablet Take 1 tablet (2 mg total)  by mouth daily. 90 tablet 1  . buPROPion (WELLBUTRIN XL) 300 MG 24 hr tablet TAKE 1 TABLET (300 MG TOTAL) BY MOUTH DAILY. 90 tablet 1  . ibuprofen (ADVIL,MOTRIN) 800 MG tablet TAKE 1 TABLET BY MOUTH 3 TIMES DAILY AS NEEDED. 90 tablet 1  . loratadine (CLARITIN) 10 MG tablet Take 1 tablet (10 mg total) by mouth daily. 90 tablet 2  . metFORMIN (GLUCOPHAGE-XR) 500 MG 24 hr tablet TAKE 2 TABLETS BY MOUTH  DAILY WITH BREAKFAST 180 tablet 1  . montelukast (SINGULAIR) 10 MG tablet TAKE 1 TABLET (10 MG TOTAL) BY MOUTH AT BEDTIME. 90 tablet 3  . Phenyleph-Doxylamine-DM-APAP (VICKS DAYQUIL/NYQUIL CLD & FLU PO) Take by mouth.    . pyridOXINE (VITAMIN B-6) 100 MG tablet Take 100 mg by mouth daily.    . Vitamin D, Ergocalciferol, (DRISDOL) 1.25 MG (50000 UT) CAPS capsule TAKE 1 CAPSULE BY MOUTH EVERY 7 DAYS 12 capsule 0  . zolpidem (AMBIEN) 10 MG tablet TAKE 1 TABLET BY MOUTH ONCE DAILY AT BEDTIME AS NEEDED FOR SLEEP 30 tablet 5  . 5-Hydroxytryptophan (5-HTP) 100 MG CAPS     . Ashwagandha 500 MG CAPS     . clindamycin (CLEOCIN) 2 % vaginal cream Place 1 Applicatorful vaginally at bedtime. (Patient not taking: Reported on 11/27/2017) 40 g 0  . Clindamycin Phosphate, 1 Dose, (CLINDESSE) vaginal cream Apply once as needed (Patient not taking: Reported on 11/27/2017) 5.8 g 1  . cyclobenzaprine (FLEXERIL) 10 MG tablet TAKE 1 TABLET (10 MG TOTAL) BY MOUTH 3 (THREE) TIMES DAILY AS NEEDED FOR MUSCLE SPASMS. (Patient not taking: Reported on 12/06/2017) 90 tablet 2  . diazepam (VALIUM) 5 MG tablet Take 1 tablet (5 mg total) by mouth every 8 (eight) hours as needed for anxiety. (Patient not taking: Reported on 12/06/2017) 90 tablet 5  . hydrochlorothiazide (MICROZIDE) 12.5 MG capsule TAKE 1 CAPSULE BY MOUTH ONCE DAILY (Patient not taking: Reported on 11/27/2017) 90 capsule 3  . losartan (COZAAR) 100 MG tablet Take 100 mg by mouth daily.  0  . Methen-Hyosc-Meth Blue-Na Phos (UROGESIC-BLUE) 81.6 MG TABS Take 1 tablet (81.6 mg total) by mouth 3 (three) times daily. (Patient not taking: Reported on 11/27/2017) 21 tablet 1  . nabumetone (RELAFEN) 750 MG tablet Take 1 tablet (750 mg total) by mouth daily. (Patient not taking: Reported on 11/27/2017) 30 tablet 1  . Naproxen-Esomeprazole (VIMOVO) 500-20 MG TBEC Take 1 tablet by mouth 2 (two) times daily. (Patient not taking: Reported on 11/27/2017) 60 tablet 6  . nitroGLYCERIN  (NITRODUR - DOSED IN MG/24 HR) 0.2 mg/hr patch 1/4 patch daily (Patient not taking: Reported on 12/06/2017) 30 patch 1  . pramipexole (MIRAPEX) 0.75 MG tablet TAKE 1 TABLET BY MOUTH 3 TIMES DAILY (Patient not taking: Reported on 11/27/2017) 90 tablet 5  . terconazole (TERAZOL 3) 0.8 % vaginal cream Place 1 applicator vaginally at bedtime. (Patient not taking: Reported on 11/27/2017) 20 g 0  . zolpidem (AMBIEN) 10 MG tablet TAKE 1 TABLET BY MOUTH NIGHTLY AT BEDTIME AS NEEDED FOR SLEEP (Patient not taking: Reported on 11/27/2017) 30 tablet 5   No current facility-administered medications on file prior to visit.        Objective:   Vitals:   12/06/17 1108  BP: 125/80  Pulse: 79  Resp: 18  Temp: 98.3 F (36.8 C)  SpO2: 96%     Wt Readings from Last 3 Encounters:  11/27/17 208 lb (94.3 kg)  11/20/17 210 lb (95.3 kg)  10/16/17  209 lb (94.8 kg)    Physical Exam:   General Appearance:  Alert, cooperative, appears stated age. In no acute distress. Afebrile.  Head:  Normocephalic, without obvious abnormality, atraumatic  Eyes:  PERRL, conjunctiva/corneas clear, EOM's intact  Ears:  Normal TM's and external ear canals, both ears. TMs with no serous effusion, no bulging, no erythema, no injection.  Nose: Nares normal, septum midline. No discharge. Normal mucosa. Tenderness with palpation over bilateral maxillary sinuses; patient describes as a bruise sensation.  Throat: Lips, mucosa, and tongue normal; teeth and gums normal. Throat reveals no erythema. Tonsils are surgically absent. Soft/hoarse voice.  Neck: Supple, symmetrical, trachea midline, no adenopathy  Lungs:   Clear to auscultation bilaterally, respirations unlabored. No wheezing, including with forced expiration.  Heart:  Regular rate and rhythm, S1 and S2 normal, no murmur, rub, or gallop  Extremities: Extremities normal, atraumatic, no cyanosis or edema  Pulses: 2+ and symmetric  Skin: Skin color, texture, turgor normal, no  rashes or lesions  Lymph nodes: Cervical, supraclavicular, and axillary nodes normal  Neurologic: Normal    Assessment & Plan:    Exam findings, diagnosis etiology and medication use and indications reviewed with patient. Follow-Up and discharge instructions provided. No emergent/urgent issues found on exam.  Patient education was provided.   Patient verbalized understanding of information provided and agrees with plan of care (POC), all questions answered. The patient is advised to call or return to clinic if condition does not see an improvement in symptoms, or to seek the care of the closest emergency department if condition worsens with the below plan.    1. Upper respiratory tract infection, unspecified type  - promethazine-phenylephrine (PROMETHAZINE VC) 6.25-5 MG/5ML SYRP; Take 5 mLs by mouth every 6 (six) hours as needed for congestion.  Dispense: 280 mL; Refill: 0 - predniSONE (DELTASONE) 20 MG tablet; Take 2 tablets (40 mg total) by mouth daily with breakfast for 5 days.  Dispense: 10 tablet; Refill: 0 - azelastine (ASTELIN) 0.1 % nasal spray; Place 1 spray into both nostrils 2 (two) times daily. Use in each nostril as directed  Dispense: 30 mL; Refill: 12  2. Sinus congestion  3. Laryngitis  4. Cough  Patient with 4 day history of URI symptoms. Classic presentation. Expected course. Vital signs stable. Suspect self-limited viral URI. Primary complaint sinus pressure/congestion. Prescribed Astelin nasal spray, Promethazine/Phenylephrine cough syrup, and 5-day course of 40mg  prednisone (sinus pressure, ear fullness/pressure, and laryngitis). Advised patient f/u in 5 days if not improving, sooner with worsening symptoms. Patient agrees with plan.  Addendum: Pharmacy called. No Promethazine-VC in stock; switched to Promethazine-DM.   Darlin Priestly, MHS, PA-C Montey Hora, MHS, PA-C Advanced Practice Provider [facility]  [address], [facility], [place], [place] [address] (p):  [phone] [email or web address]

## 2017-12-11 ENCOUNTER — Telehealth: Payer: Self-pay | Admitting: Emergency Medicine

## 2017-12-11 NOTE — Telephone Encounter (Signed)
Left message follow up call from visit with [facility]. 

## 2017-12-13 ENCOUNTER — Ambulatory Visit (INDEPENDENT_AMBULATORY_CARE_PROVIDER_SITE_OTHER): Payer: Self-pay | Admitting: Physician Assistant

## 2017-12-13 ENCOUNTER — Encounter: Payer: Self-pay | Admitting: Physician Assistant

## 2017-12-13 VITALS — BP 120/72 | HR 98 | Temp 98.6°F | Wt 208.0 lb

## 2017-12-13 DIAGNOSIS — J22 Unspecified acute lower respiratory infection: Secondary | ICD-10-CM

## 2017-12-13 MED ORDER — FLUCONAZOLE 150 MG PO TABS: 150.0000 mg | ORAL_TABLET | Freq: Every day | ORAL | 0 refills | Status: AC

## 2017-12-13 MED ORDER — ALBUTEROL SULFATE HFA 108 (90 BASE) MCG/ACT IN AERS: 2.0000 | INHALATION_SPRAY | RESPIRATORY_TRACT | 0 refills | Status: DC | PRN

## 2017-12-13 MED ORDER — BENZONATATE 200 MG PO CAPS: 200.0000 mg | ORAL_CAPSULE | Freq: Two times a day (BID) | ORAL | 0 refills | Status: DC | PRN

## 2017-12-13 MED ORDER — DOXYCYCLINE HYCLATE 100 MG PO TABS: 100.0000 mg | ORAL_TABLET | Freq: Two times a day (BID) | ORAL | 0 refills | Status: AC

## 2017-12-13 NOTE — Patient Instructions (Signed)
Thank you for choosing [facility] for your health care needs today.  You have been diagnosed with a lower respiratory tract infection.  Recommend you increase fluids. Rest. Use humidifier in bedroom. Prop self up with several pillows at night, to sleep in elevated position.  You have been prescribed an antibiotic, Doxycycline. Take 1 tab by mouth twice a day x 10 days. Use albuterol inhaler, 2 puffs every 4-6 hours, for cough/chest tightness.  Follow-up with family physician or with urgent care in a few days if symptoms not improving, sooner with any worsening symptoms.  Acute Bronchitis, Adult Acute bronchitis is when air tubes (bronchi) in the lungs suddenly get swollen. The condition can make it hard to breathe. It can also cause these symptoms:  A cough.  Coughing up clear, yellow, or green mucus.  Wheezing.  Chest congestion.  Shortness of breath.  A fever.  Body aches.  Chills.  A sore throat.  Follow these instructions at home: Medicines  Take over-the-counter and prescription medicines only as told by your doctor.  If you were prescribed an antibiotic medicine, take it as told by your doctor. Do not stop taking the antibiotic even if you start to feel better. General instructions  Rest.  Drink enough fluids to keep your pee (urine) clear or pale yellow.  Avoid smoking and secondhand smoke. If you smoke and you need help quitting, ask your doctor. Quitting will help your lungs heal faster.  Use an inhaler, cool mist vaporizer, or humidifier as told by your doctor.  Keep all follow-up visits as told by your doctor. This is important. How is this prevented? To lower your risk of getting this condition again:  Wash your hands often with soap and water. If you cannot use soap and water, use hand sanitizer.  Avoid contact with people who have cold symptoms.  Try not to touch your hands to your mouth, nose, or eyes.  Make sure to get the flu shot every  year.  Contact a doctor if:  Your symptoms do not get better in 2 weeks. Get help right away if:  You cough up blood.  You have chest pain.  You have very bad shortness of breath.  You become dehydrated.  You faint (pass out) or keep feeling like you are going to pass out.  You keep throwing up (vomiting).  You have a very bad headache.  Your fever or chills gets worse. This information is not intended to replace advice given to you by your health care provider. Make sure you discuss any questions you have with your health care provider. Document Released: 06/15/2007 Document Revised: 08/05/2015 Document Reviewed: 06/17/2015 Elsevier Interactive Patient Education  [employer].

## 2017-12-13 NOTE — Progress Notes (Signed)
Patient ID: [name] DOB: [birth year] AGE: 47 y.o. MRN: [id number]   PCP: [name], MD   Chief Complaint:  Chief Complaint  Patient presents with  . sinus infection and no voice     Subjective:    HPI:  Patricia [name] is a 47 y.o. female presents for evaluation  Chief Complaint  Patient presents with  . sinus infection and no voice   47 year old female returns to [facility] in regards to continuation of URI symptoms.  Patient seen at [facility] one week ago, on 12/06/2017, with four day history of URI symptoms. Diagnosed with viral URI. Prescribed 40mg  prednisone qd x 5 days, Astelin nasal spray, and Promethazine-DM cough syrup.  Patient reports symptoms simply continued. Lost voice. Gradually returning over past two days. Continued rhinorrhea and nasal congestion. Continued cough. Intermittently dry vs productive. Feels unable to take a deep breath. Cough worse at night; has been sleeping in a recliner. Has been using prescribed medication as well as OTC Dayquil and Nyquil with no change in symptoms. Denies fever, chills, body aches, headache, ear pain, maxillary sinus pain, sore throat, chest pain, wheezing, dyspnea.  A complete, at least 10 system review of symptoms was performed, pertinent positives and negatives as mentioned in HPI.  The following portions of the patient's history were reviewed and updated as appropriate: allergies, current medications and past medical history.  Patient Active Problem List   Diagnosis Date Noted  . Screening for cervical cancer 07/15/2017  . Prediabetes 06/13/2017  . Left groin pain 06/06/2017  . S/P cervical discectomy 03/05/2017  . Cervical radiculopathy at C8 01/19/2017  . Insomnia due to psychological stress 09/20/2016  . Loose body(ies), joint, elbow, right 08/08/2016  . Patellar tendon avulsion, left, initial encounter 08/08/2016  . History of concussion 08/08/2016  . Family  history of secondary breast cancer 06/08/2016  . Obesity (BMI 30.0-34.9) 06/08/2016  . Carpal tunnel syndrome 12/28/2015  . Radiculopathy affecting upper extremity 11/23/2015  . Nonallopathic lesion of lumbosacral region 06/29/2015  . Nonallopathic lesion of thoracic region 06/29/2015  . Nonallopathic lesion of sacral region 06/29/2015  . Vaginal high risk HPV DNA test positive 06/01/2015  . Lumbar degenerative disc disease 03/30/2015  . Trochanteric bursitis of right hip 03/07/2015  . Premature atrial contractions   . S/P hysterectomy 05/13/2013  . Diabetes mellitus without complication ([place]) 80/32/1224  . Restless legs 09/14/2012  . Major depressive disorder, single episode 09/14/2012  . Visit for preventive health examination 05/08/2012  . Essential hypertension 04/17/2011  . Thyroid disease     Allergies  Allergen Reactions  . Clarithromycin   . Tramadol   . Zithromax [Azithromycin]     Current Outpatient Medications on File Prior to Visit  Medication Sig Dispense Refill  . 5-Hydroxytryptophan (5-HTP) 100 MG CAPS     . amLODipine (NORVASC) 5 MG tablet Take 5 mg by mouth.    . ARIPiprazole (ABILIFY) 2 MG tablet Take 1 tablet (2 mg total) by mouth daily. 90 tablet 1  . Ashwagandha 500 MG CAPS     . azelastine (ASTELIN) 0.1 % nasal spray Place 1 spray into both nostrils 2 (two) times daily. Use in each nostril as directed 30 mL 12  . buPROPion (WELLBUTRIN XL) 300 MG 24 hr tablet TAKE 1 TABLET (300 MG TOTAL) BY MOUTH DAILY. 90 tablet 1  . clindamycin (CLEOCIN) 2 % vaginal cream Place 1 Applicatorful vaginally at bedtime. (Patient not taking: Reported on 11/27/2017) 40 g  0  . Clindamycin Phosphate, 1 Dose, (CLINDESSE) vaginal cream Apply once as needed (Patient not taking: Reported on 11/27/2017) 5.8 g 1  . cyclobenzaprine (FLEXERIL) 10 MG tablet TAKE 1 TABLET (10 MG TOTAL) BY MOUTH 3 (THREE) TIMES DAILY AS NEEDED FOR MUSCLE SPASMS. (Patient not taking: Reported on 12/06/2017) 90  tablet 2  . diazepam (VALIUM) 5 MG tablet Take 1 tablet (5 mg total) by mouth every 8 (eight) hours as needed for anxiety. (Patient not taking: Reported on 12/06/2017) 90 tablet 5  . hydrochlorothiazide (MICROZIDE) 12.5 MG capsule TAKE 1 CAPSULE BY MOUTH ONCE DAILY (Patient not taking: Reported on 11/27/2017) 90 capsule 3  . ibuprofen (ADVIL,MOTRIN) 800 MG tablet TAKE 1 TABLET BY MOUTH 3 TIMES DAILY AS NEEDED. 90 tablet 1  . loratadine (CLARITIN) 10 MG tablet Take 1 tablet (10 mg total) by mouth daily. 90 tablet 2  . losartan (COZAAR) 100 MG tablet Take 100 mg by mouth daily.  0  . metFORMIN (GLUCOPHAGE-XR) 500 MG 24 hr tablet TAKE 2 TABLETS BY MOUTH DAILY WITH BREAKFAST 180 tablet 1  . Methen-Hyosc-Meth Blue-Na Phos (UROGESIC-BLUE) 81.6 MG TABS Take 1 tablet (81.6 mg total) by mouth 3 (three) times daily. (Patient not taking: Reported on 11/27/2017) 21 tablet 1  . montelukast (SINGULAIR) 10 MG tablet TAKE 1 TABLET (10 MG TOTAL) BY MOUTH AT BEDTIME. 90 tablet 3  . nabumetone (RELAFEN) 750 MG tablet Take 1 tablet (750 mg total) by mouth daily. (Patient not taking: Reported on 11/27/2017) 30 tablet 1  . Naproxen-Esomeprazole (VIMOVO) 500-20 MG TBEC Take 1 tablet by mouth 2 (two) times daily. (Patient not taking: Reported on 11/27/2017) 60 tablet 6  . nitroGLYCERIN (NITRODUR - DOSED IN MG/24 HR) 0.2 mg/hr patch 1/4 patch daily (Patient not taking: Reported on 12/06/2017) 30 patch 1  . Phenyleph-Doxylamine-DM-APAP (VICKS DAYQUIL/NYQUIL CLD & FLU PO) Take by mouth.    . pramipexole (MIRAPEX) 0.75 MG tablet TAKE 1 TABLET BY MOUTH 3 TIMES DAILY (Patient not taking: Reported on 11/27/2017) 90 tablet 5  . promethazine-phenylephrine (PROMETHAZINE VC) 6.25-5 MG/5ML SYRP Take 5 mLs by mouth every 6 (six) hours as needed for congestion. 280 mL 0  . pyridOXINE (VITAMIN B-6) 100 MG tablet Take 100 mg by mouth daily.    Marland Kitchen terconazole (TERAZOL 3) 0.8 % vaginal cream Place 1 applicator vaginally at bedtime. (Patient  not taking: Reported on 11/27/2017) 20 g 0  . Vitamin D, Ergocalciferol, (DRISDOL) 1.25 MG (50000 UT) CAPS capsule TAKE 1 CAPSULE BY MOUTH EVERY 7 DAYS 12 capsule 0  . zolpidem (AMBIEN) 10 MG tablet TAKE 1 TABLET BY MOUTH ONCE DAILY AT BEDTIME AS NEEDED FOR SLEEP 30 tablet 5  . zolpidem (AMBIEN) 10 MG tablet TAKE 1 TABLET BY MOUTH NIGHTLY AT BEDTIME AS NEEDED FOR SLEEP (Patient not taking: Reported on 11/27/2017) 30 tablet 5   No current facility-administered medications on file prior to visit.        Objective:   Vitals:   12/13/17 1335  BP: 120/72  Pulse: 98  Temp: 98.6 F (37 C)  SpO2: 95%     Wt Readings from Last 3 Encounters:  12/13/17 208 lb (94.3 kg)  11/27/17 208 lb (94.3 kg)  11/20/17 210 lb (95.3 kg)    Physical Exam:   General Appearance:  Alert, cooperative, appears stated age. In no acute distress. Afebrile.  Head:  Normocephalic, without obvious abnormality, atraumatic  Eyes:  PERRL, conjunctiva/corneas clear, EOM's intact  Ears:  Normal TM's and external ear  canals, both ears  Nose: Nares normal, septum midline. Nasal mucosa reveals bilateral edema. No visible drainage. No tenderness with palpation over maxillary sinuses.  Throat: Lips, mucosa, and tongue normal; teeth and gums normal. Throat reveals no erythema. Hoarse/soft voice during examination.  Neck: Supple, symmetrical, trachea midline, no lymphadenopathy tenderness/mass/nodules; no carotid bruit or JVD  Lungs:   Lung auscultation reveals diminished breath sounds in bases bilaterally. Deep inspiration with forced expiration reveals possible rhonchi/wheezing in lower lobes bilaterally. Deep inspiration triggers coarse/bronchitic cough. No crackles.  Heart:  Regular rate and rhythm, S1 and S2 normal, no murmur, rub, or gallop  Extremities: Extremities normal, atraumatic, no cyanosis or edema  Pulses: 2+ and symmetric  Skin: Skin color, texture, turgor normal, no rashes or lesions. No peripheral edema.   Lymph nodes: Cervical, supraclavicular, and axillary nodes normal  Neurologic: Normal    Assessment & Plan:    Exam findings, diagnosis etiology and medication use and indications reviewed with patient. Follow-Up and discharge instructions provided. No emergent/urgent issues found on exam.  Patient education was provided.   Patient verbalized understanding of information provided and agrees with plan of care (POC), all questions answered. The patient is advised to call or return to clinic if condition does not see an improvement in symptoms, or to seek the care of the closest emergency department if condition worsens with the below plan.   Patient with 1-1/2 week history of URI symptoms. Patient's primary complaint loss of voice. However, new onset of difficulty taking a deep breath, abnormal lung sounds in bases, and pulse ox of 95% (96% at last office visit one week ago, 99% when seen one month ago by PCP for knee pain). Will treat patient for possible bacterial bronchitis/pneumonia with 10-day course of Doxycycline. Patient's vital signs stable, patient in no acute distress. Prescribed albuterol inhaler and Tessalon Perles. Advised patient follow-up with PCP or urgent care in 2-3 days if not improving, at that time may have CXR, blood work, Social research officer, government.    Darlin Priestly, MHS, PA-C Montey Hora, MHS, PA-C Advanced Practice Provider [facility]  [address] [facility], [place], [place] [address] (p):  [phone] [email or web address]

## 2017-12-15 ENCOUNTER — Telehealth: Payer: Self-pay | Admitting: Emergency Medicine

## 2017-12-15 NOTE — Telephone Encounter (Signed)
Left message follow up call from visit with [facility]. 

## 2017-12-25 ENCOUNTER — Encounter: Payer: Self-pay | Admitting: Student in an Organized Health Care Education/Training Program

## 2017-12-25 ENCOUNTER — Other Ambulatory Visit: Payer: Self-pay

## 2017-12-25 ENCOUNTER — Ambulatory Visit
Payer: 59 | Attending: Student in an Organized Health Care Education/Training Program | Admitting: Student in an Organized Health Care Education/Training Program

## 2017-12-25 VITALS — BP 104/68 | HR 72 | Temp 98.0°F | Resp 16 | Ht 69.0 in | Wt 208.0 lb

## 2017-12-25 DIAGNOSIS — M4802 Spinal stenosis, cervical region: Secondary | ICD-10-CM | POA: Insufficient documentation

## 2017-12-25 DIAGNOSIS — Z8249 Family history of ischemic heart disease and other diseases of the circulatory system: Secondary | ICD-10-CM | POA: Insufficient documentation

## 2017-12-25 DIAGNOSIS — M542 Cervicalgia: Secondary | ICD-10-CM | POA: Insufficient documentation

## 2017-12-25 DIAGNOSIS — R1030 Lower abdominal pain, unspecified: Secondary | ICD-10-CM | POA: Insufficient documentation

## 2017-12-25 DIAGNOSIS — E119 Type 2 diabetes mellitus without complications: Secondary | ICD-10-CM | POA: Insufficient documentation

## 2017-12-25 DIAGNOSIS — I491 Atrial premature depolarization: Secondary | ICD-10-CM | POA: Diagnosis not present

## 2017-12-25 DIAGNOSIS — M7061 Trochanteric bursitis, right hip: Secondary | ICD-10-CM | POA: Insufficient documentation

## 2017-12-25 DIAGNOSIS — I10 Essential (primary) hypertension: Secondary | ICD-10-CM | POA: Diagnosis not present

## 2017-12-25 DIAGNOSIS — Z6830 Body mass index (BMI) 30.0-30.9, adult: Secondary | ICD-10-CM | POA: Insufficient documentation

## 2017-12-25 DIAGNOSIS — G2581 Restless legs syndrome: Secondary | ICD-10-CM | POA: Diagnosis not present

## 2017-12-25 DIAGNOSIS — M5136 Other intervertebral disc degeneration, lumbar region: Secondary | ICD-10-CM | POA: Insufficient documentation

## 2017-12-25 DIAGNOSIS — M5412 Radiculopathy, cervical region: Secondary | ICD-10-CM | POA: Diagnosis not present

## 2017-12-25 DIAGNOSIS — Z885 Allergy status to narcotic agent status: Secondary | ICD-10-CM | POA: Diagnosis not present

## 2017-12-25 DIAGNOSIS — Z79899 Other long term (current) drug therapy: Secondary | ICD-10-CM | POA: Insufficient documentation

## 2017-12-25 DIAGNOSIS — E669 Obesity, unspecified: Secondary | ICD-10-CM | POA: Insufficient documentation

## 2017-12-25 DIAGNOSIS — M9981 Other biomechanical lesions of cervical region: Secondary | ICD-10-CM | POA: Diagnosis not present

## 2017-12-25 DIAGNOSIS — Z7984 Long term (current) use of oral hypoglycemic drugs: Secondary | ICD-10-CM | POA: Diagnosis not present

## 2017-12-25 DIAGNOSIS — M4712 Other spondylosis with myelopathy, cervical region: Secondary | ICD-10-CM | POA: Diagnosis not present

## 2017-12-25 DIAGNOSIS — M5418 Radiculopathy, sacral and sacrococcygeal region: Secondary | ICD-10-CM | POA: Diagnosis not present

## 2017-12-25 DIAGNOSIS — F329 Major depressive disorder, single episode, unspecified: Secondary | ICD-10-CM | POA: Insufficient documentation

## 2017-12-25 DIAGNOSIS — R87811 Vaginal high risk human papillomavirus (HPV) DNA test positive: Secondary | ICD-10-CM | POA: Diagnosis not present

## 2017-12-25 DIAGNOSIS — Z888 Allergy status to other drugs, medicaments and biological substances status: Secondary | ICD-10-CM | POA: Insufficient documentation

## 2017-12-25 DIAGNOSIS — Z881 Allergy status to other antibiotic agents status: Secondary | ICD-10-CM | POA: Insufficient documentation

## 2017-12-25 NOTE — Progress Notes (Signed)
Safety precautions to be maintained throughout the outpatient stay will include: orient to surroundings, keep bed in low position, maintain call bell within reach at all times, provide assistance with transfer out of bed and ambulation.  

## 2017-12-25 NOTE — Progress Notes (Signed)
Patient's Name: Patricia [name]  MRN: [id number]  Referring Provider: [name], MD  DOB: March 13, 1970  PCP: [name], MD  DOS: 12/25/2017  Note by: Gillis Santa, MD  Service setting: Ambulatory outpatient  Specialty: Interventional Pain Management  Location: [facility] (AMB) Pain Management Facility    Patient type: Established   Primary Reason(s) for Visit: Encounter for post-procedure evaluation of chronic illness with mild to moderate exacerbation CC: No pain  HPI  Patricia [name] is a 47 y.o. year old, female patient, who comes today for a post-procedure evaluation. She has Thyroid disease; Essential hypertension; Visit for preventive health examination; Restless legs; Major depressive disorder, single episode; S/P hysterectomy; Diabetes mellitus without complication ([place]); Premature atrial contractions; Trochanteric bursitis of right hip; Lumbar degenerative disc disease; Vaginal high risk HPV DNA test positive; Nonallopathic lesion of lumbosacral region; Nonallopathic lesion of thoracic region; Nonallopathic lesion of sacral region; Radiculopathy affecting upper extremity; Carpal tunnel syndrome; Family history of secondary breast cancer; Obesity (BMI 30.0-34.9); Loose body(ies), joint, elbow, right; Patellar tendon avulsion, left, initial encounter; History of concussion; Insomnia due to psychological stress; Cervical radiculopathy; S/P cervical discectomy; Left groin pain; Prediabetes; Screening for cervical cancer; Neuroforaminal stenosis of cervical spine; and Cervicalgia on their problem list. Her primarily concern today is the No pain  Pain Assessment: Location:    Left neck with radiation to left arm, improved Radiating:  Yes to left arm and dermatomal fashion, improved Onset:  Started after cervical spine surgery, improved after ESI Duration: Chronic pain Quality:  Sharp, shooting Severity: 0-No pain/10 (subjective, self-reported pain score)  Note: Reported level is  compatible with observation.                         When using our objective Pain Scale, levels between 6 and 10/10 are said to belong in an emergency room, as it progressively worsens from a 6/10, described as severely limiting, requiring emergency care not usually available at an outpatient pain management facility. At a 6/10 level, communication becomes difficult and requires great effort. Assistance to reach the emergency department may be required. Facial flushing and profuse sweating along with potentially dangerous increases in heart rate and blood pressure will be evident. Effect on ADL: Reports no pain Timing:   Modifying factors:   BP: 104/68  HR: 72  Patricia [name] comes in today for post-procedure evaluation.  Further details on both, my assessment(s), as well as the proposed treatment plan, please see below.  Post-Procedure Assessment  11/27/2017 Procedure: Left T2/T3 ESI Pre-procedure pain score:  3/10 Post-procedure pain score: 3/10         Influential Factors: BMI: 30.72 kg/m Intra-procedural challenges: None observed.         Assessment challenges: None detected.              Reported side-effects: None.        Post-procedural adverse reactions or complications: None reported         Sedation: Please see nurses note. When no sedatives are used, the analgesic levels obtained are directly associated to the effectiveness of the local anesthetics. However, when sedation is provided, the level of analgesia obtained during the initial 1 hour following the intervention, is believed to be the result of a combination of factors. These factors may include, but are not limited to: 1. The effectiveness of the local anesthetics used. 2. The effects of the analgesic(s) and/or anxiolytic(s) used. 3. The degree of discomfort experienced  by the patient at the time of the procedure. 4. The patients ability and reliability in recalling and recording the events. 5. The presence and influence  of possible secondary gains and/or psychosocial factors. Reported result: Relief experienced during the 1st hour after the procedure: 100 % (Ultra-Short Term Relief)            Interpretative annotation: Clinically appropriate result. Analgesia during this period is likely to be Local Anesthetic and/or IV Sedative (Analgesic/Anxiolytic) related.          Effects of local anesthetic: The analgesic effects attained during this period are directly associated to the localized infiltration of local anesthetics and therefore cary significant diagnostic value as to the etiological location, or anatomical origin, of the pain. Expected duration of relief is directly dependent on the pharmacodynamics of the local anesthetic used. Long-acting (4-6 hours) anesthetics used.  Reported result: Relief during the next 4 to 6 hour after the procedure: 80 % (Short-Term Relief)            Interpretative annotation: Clinically appropriate result. Analgesia during this period is likely to be Local Anesthetic-related.          Long-term benefit: Defined as the period of time past the expected duration of local anesthetics (1 hour for short-acting and 4-6 hours for long-acting). With the possible exception of prolonged sympathetic blockade from the local anesthetics, benefits during this period are typically attributed to, or associated with, other factors such as analgesic sensory neuropraxia, antiinflammatory effects, or beneficial biochemical changes provided by agents other than the local anesthetics.  Reported result: Extended relief following procedure: 100 %(ongoing) (Long-Term Relief)            Interpretative annotation: Clinically possible results. Good relief. No permanent benefit expected. Inflammation plays a part in the etiology to the pain.          Current benefits: Defined as reported results that persistent at this point in time.   Analgesia: 100 %            Function: Patricia [name] reports improvement in  function ROM: Patricia [name] reports improvement in ROM Interpretative annotation: Complete relief. Therapeutic benefit observed. Effective diagnostic intervention.          Interpretation: Results would suggest a successful diagnostic and therapeutic intervention.                  Plan:  Set up procedure as a PRN palliative treatment option for this patient.                Laboratory Chemistry  Inflammation Markers (CRP: Acute Phase) (ESR: Chronic Phase) No results found for: CRP, ESRSEDRATE, LATICACIDVEN                       Rheumatology Markers No results found for: RF, ANA, LABURIC, URICUR, LYMEIGGIGMAB, LYMEABIGMQN, HLAB27                      Renal Function Markers Lab Results  Component Value Date   BUN 9 06/07/2017   CREATININE 0.76 06/07/2017   BCR 11 10/13/2016   GFRAA >60 01/15/2017   GFRNONAA >60 01/15/2017                             Hepatic Function Markers Lab Results  Component Value Date   AST 12 06/07/2017   ALT 12 06/07/2017   ALBUMIN 3.6 06/07/2017  ALKPHOS 60 06/07/2017   HCVAB NEGATIVE 05/12/2014   LIPASE 25 01/15/2017                        Electrolytes Lab Results  Component Value Date   NA 134 (L) 06/07/2017   K 4.0 06/07/2017   CL 100 06/07/2017   CALCIUM 8.7 06/07/2017   MG 1.8 03/17/2014                        Neuropathy Markers Lab Results  Component Value Date   HGBA1C 6.3 06/07/2017   HIV NON-REACTIVE 06/23/2017                        CNS Tests No results found for: COLORCSF, APPEARCSF, RBCCOUNTCSF, WBCCSF, POLYSCSF, LYMPHSCSF, EOSCSF, PROTEINCSF, GLUCCSF, JCVIRUS, CSFOLI, IGGCSF                      Bone Pathology Markers Lab Results  Component Value Date   VD25OH 45.79 06/01/2015                         Coagulation Parameters Lab Results  Component Value Date   PLT 293 01/15/2017                        Cardiovascular Markers Lab Results  Component Value Date   TROPONINI <0.03 01/15/2017   HGB 12.9 01/15/2017    HCT 38.5 01/15/2017                         CA Markers No results found for: CEA, CA125, LABCA2                      Note: Lab results reviewed.  Recent Diagnostic Imaging Results  DG C-Arm 1-60 Min-No Report Fluoroscopy was utilized by the requesting physician.  No radiographic  interpretation.   Complexity Note: Imaging results reviewed. Results shared with Patricia [name], using Layman's terms.                         Meds   Current Outpatient Medications:  .  amLODipine (NORVASC) 5 MG tablet, Take 5 mg by mouth., Disp: , Rfl:  .  ibuprofen (ADVIL,MOTRIN) 800 MG tablet, TAKE 1 TABLET BY MOUTH 3 TIMES DAILY AS NEEDED., Disp: 90 tablet, Rfl: 1 .  loratadine (CLARITIN) 10 MG tablet, Take 1 tablet (10 mg total) by mouth daily., Disp: 90 tablet, Rfl: 2 .  metFORMIN (GLUCOPHAGE-XR) 500 MG 24 hr tablet, TAKE 2 TABLETS BY MOUTH DAILY WITH BREAKFAST, Disp: 180 tablet, Rfl: 1 .  montelukast (SINGULAIR) 10 MG tablet, TAKE 1 TABLET (10 MG TOTAL) BY MOUTH AT BEDTIME., Disp: 90 tablet, Rfl: 3 .  Vitamin D, Ergocalciferol, (DRISDOL) 1.25 MG (50000 UT) CAPS capsule, TAKE 1 CAPSULE BY MOUTH EVERY 7 DAYS, Disp: 12 capsule, Rfl: 0 .  zolpidem (AMBIEN) 10 MG tablet, TAKE 1 TABLET BY MOUTH ONCE DAILY AT BEDTIME AS NEEDED FOR SLEEP, Disp: 30 tablet, Rfl: 5 .  5-Hydroxytryptophan (5-HTP) 100 MG CAPS, , Disp: , Rfl:  .  albuterol (PROVENTIL HFA;VENTOLIN HFA) 108 (90 Base) MCG/ACT inhaler, Inhale 2 puffs into the lungs every 4 (four) hours as needed for wheezing or shortness of breath. (Patient not taking: Reported on 12/25/2017), Disp: 1 Inhaler,  Rfl: 0 .  ARIPiprazole (ABILIFY) 2 MG tablet, Take 1 tablet (2 mg total) by mouth daily. (Patient not taking: Reported on 12/25/2017), Disp: 90 tablet, Rfl: 1 .  Ashwagandha 500 MG CAPS, , Disp: , Rfl:  .  azelastine (ASTELIN) 0.1 % nasal spray, Place 1 spray into both nostrils 2 (two) times daily. Use in each nostril as directed (Patient not taking: Reported on  12/25/2017), Disp: 30 mL, Rfl: 12 .  benzonatate (TESSALON) 200 MG capsule, Take 1 capsule (200 mg total) by mouth 2 (two) times daily as needed for cough. (Patient not taking: Reported on 12/25/2017), Disp: 20 capsule, Rfl: 0 .  clindamycin (CLEOCIN) 2 % vaginal cream, Place 1 Applicatorful vaginally at bedtime. (Patient not taking: Reported on 11/27/2017), Disp: 40 g, Rfl: 0 .  Clindamycin Phosphate, 1 Dose, (CLINDESSE) vaginal cream, Apply once as needed (Patient not taking: Reported on 11/27/2017), Disp: 5.8 g, Rfl: 1 .  cyclobenzaprine (FLEXERIL) 10 MG tablet, TAKE 1 TABLET (10 MG TOTAL) BY MOUTH 3 (THREE) TIMES DAILY AS NEEDED FOR MUSCLE SPASMS. (Patient not taking: Reported on 12/06/2017), Disp: 90 tablet, Rfl: 2 .  diazepam (VALIUM) 5 MG tablet, Take 1 tablet (5 mg total) by mouth every 8 (eight) hours as needed for anxiety. (Patient not taking: Reported on 12/06/2017), Disp: 90 tablet, Rfl: 5 .  hydrochlorothiazide (MICROZIDE) 12.5 MG capsule, TAKE 1 CAPSULE BY MOUTH ONCE DAILY (Patient not taking: Reported on 11/27/2017), Disp: 90 capsule, Rfl: 3 .  losartan (COZAAR) 100 MG tablet, Take 100 mg by mouth daily., Disp: , Rfl: 0 .  Phenyleph-Doxylamine-DM-APAP (VICKS DAYQUIL/NYQUIL CLD & FLU PO), Take by mouth., Disp: , Rfl:  .  pyridOXINE (VITAMIN B-6) 100 MG tablet, Take 100 mg by mouth daily., Disp: , Rfl:  .  zolpidem (AMBIEN) 10 MG tablet, TAKE 1 TABLET BY MOUTH NIGHTLY AT BEDTIME AS NEEDED FOR SLEEP (Patient not taking: Reported on 11/27/2017), Disp: 30 tablet, Rfl: 5  ROS  Constitutional: Denies any fever or chills Gastrointestinal: No reported hemesis, hematochezia, vomiting, or acute GI distress Musculoskeletal: Denies any acute onset joint swelling, redness, loss of ROM, or weakness Neurological: No reported episodes of acute onset apraxia, aphasia, dysarthria, agnosia, amnesia, paralysis, loss of coordination, or loss of consciousness  Allergies  Patricia [name] is allergic to  clarithromycin; tramadol; and zithromax [azithromycin].  PFSH  Drug: Patricia [name]  reports no history of drug use. Alcohol:  reports current alcohol use. Tobacco:  reports that she has never smoked. She has never used smokeless tobacco. Medical:  has a past medical history of Cervical spondylosis with myelopathy and radiculopathy (01/2017), Concussion (07/2016), Hypertension, Premature atrial contractions, and Thyroid disease. Surgical: Patricia [name]  has a past surgical history that includes Abdominal hysterectomy (Dec 2012); Tonsillectomy (May 2012); transthoracic echocardiogram (04/2014); 48 hour Holter Monitor (05/04/2014); Breast lumpectomy (Right, 1991 or 1992); Breast excisional biopsy (Right, 1990); and ACDL (02/06/2017). Family: family history includes Alcohol abuse in her father; Breast cancer in her paternal grandmother; Breast cancer (age of onset: 18) in her mother and another family member; COPD in her father; Cancer in her paternal grandmother; Diabetes in her brother and mother; Heart failure in her father; Hyperlipidemia in her mother; Hypertension in her mother; Mental illness in her father; Mitral valve prolapse in her father.  Constitutional Exam  General appearance: Well nourished, well developed, and well hydrated. In no apparent acute distress Vitals:   12/25/17 1329  BP: 104/68  Pulse: 72  Resp: 16  Temp: 98 F (  36.7 C)  SpO2: 99%  Weight: 208 lb (94.3 kg)  Height: '5\' 9"'$  (1.753 m)   BMI Assessment: Estimated body mass index is 30.72 kg/m as calculated from the following:   Height as of this encounter: '5\' 9"'$  (1.753 m).   Weight as of this encounter: 208 lb (94.3 kg).  BMI interpretation table: BMI level Category Range association with higher incidence of chronic pain  <18 kg/m2 Underweight   18.5-24.9 kg/m2 Ideal body weight   25-29.9 kg/m2 Overweight Increased incidence by 20%  30-34.9 kg/m2 Obese (Class I) Increased incidence by 68%  35-39.9 kg/m2 Severe  obesity (Class II) Increased incidence by 136%  >40 kg/m2 Extreme obesity (Class III) Increased incidence by 254%   Patient's current BMI Ideal Body weight  Body mass index is 30.72 kg/m. Ideal body weight: 66.2 kg (145 lb 15.1 oz) Adjusted ideal body weight: 77.5 kg (170 lb 12.3 oz)   BMI Readings from Last 4 Encounters:  12/25/17 30.72 kg/m  12/13/17 30.72 kg/m  11/27/17 30.72 kg/m  11/20/17 31.01 kg/m   Wt Readings from Last 4 Encounters:  12/25/17 208 lb (94.3 kg)  12/13/17 208 lb (94.3 kg)  11/27/17 208 lb (94.3 kg)  11/20/17 210 lb (95.3 kg)  Psych/Mental status: Alert, oriented x 3 (person, place, & time)       Eyes: PERLA Respiratory: No evidence of acute respiratory distress  Cervical Spine Area Exam  Skin & Axial Inspection: Well healed scar from previous spine surgery detected Alignment: Symmetrical Functional ROM: Improved after treatment      Stability: No instability detected Muscle Tone/Strength: Functionally intact. No obvious neuro-muscular anomalies detected. Sensory (Neurological): Improved Palpation: No palpable anomalies              Upper Extremity (UE) Exam    Side: Right upper extremity  Side: Left upper extremity  Skin & Extremity Inspection: Skin color, temperature, and hair growth are WNL. No peripheral edema or cyanosis. No masses, redness, swelling, asymmetry, or associated skin lesions. No contractures.  Skin & Extremity Inspection: Skin color, temperature, and hair growth are WNL. No peripheral edema or cyanosis. No masses, redness, swelling, asymmetry, or associated skin lesions. No contractures.  Functional ROM: Unrestricted ROM          Functional ROM: Improved after treatment          Muscle Tone/Strength: Functionally intact. No obvious neuro-muscular anomalies detected.  Muscle Tone/Strength: Functionally intact. No obvious neuro-muscular anomalies detected.  Sensory (Neurological): Unimpaired          Sensory (Neurological): Improved           Palpation: No palpable anomalies              Palpation: No palpable anomalies              Provocative Test(s):  Phalen's test: deferred Tinel's test: deferred Apley's scratch test (touch opposite shoulder):  Action 1 (Across chest): deferred Action 2 (Overhead): deferred Action 3 (LB reach): deferred   Provocative Test(s):  Phalen's test: deferred Tinel's test: deferred Apley's scratch test (touch opposite shoulder):  Action 1 (Across chest): deferred Action 2 (Overhead): deferred Action 3 (LB reach): deferred    Thoracic Spine Area Exam  Skin & Axial Inspection: No masses, redness, or swelling Alignment: Symmetrical Functional ROM: Unrestricted ROM Stability: No instability detected Muscle Tone/Strength: Functionally intact. No obvious neuro-muscular anomalies detected. Sensory (Neurological): Unimpaired Muscle strength & Tone: No palpable anomalies  Lumbar Spine Area Exam  Skin & Axial  Inspection: No masses, redness, or swelling Alignment: Symmetrical Functional ROM: Unrestricted ROM       Stability: No instability detected Muscle Tone/Strength: Functionally intact. No obvious neuro-muscular anomalies detected. Sensory (Neurological): Unimpaired Palpation: No palpable anomalies       Provocative Tests: Hyperextension/rotation test: deferred today       Lumbar quadrant test (Kemp's test): deferred today       Lateral bending test: deferred today       Patrick's Maneuver: deferred today                   FABER* test: deferred today                   S-I anterior distraction/compression test: deferred today         S-I lateral compression test: deferred today         S-I Thigh-thrust test: deferred today         S-I Gaenslen's test: deferred today         *(Flexion, ABduction and External Rotation)  Gait & Posture Assessment  Ambulation: Unassisted Gait: Relatively normal for age and body habitus Posture: WNL   Lower Extremity Exam    Side: Right lower  extremity  Side: Left lower extremity  Stability: No instability observed          Stability: No instability observed          Skin & Extremity Inspection: Skin color, temperature, and hair growth are WNL. No peripheral edema or cyanosis. No masses, redness, swelling, asymmetry, or associated skin lesions. No contractures.  Skin & Extremity Inspection: Skin color, temperature, and hair growth are WNL. No peripheral edema or cyanosis. No masses, redness, swelling, asymmetry, or associated skin lesions. No contractures.  Functional ROM: Unrestricted ROM                  Functional ROM: Unrestricted ROM                  Muscle Tone/Strength: Functionally intact. No obvious neuro-muscular anomalies detected.  Muscle Tone/Strength: Functionally intact. No obvious neuro-muscular anomalies detected.  Sensory (Neurological): Unimpaired        Sensory (Neurological): Unimpaired        DTR: Patellar: deferred today Achilles: deferred today Plantar: deferred today  DTR: Patellar: deferred today Achilles: deferred today Plantar: deferred today  Palpation: No palpable anomalies  Palpation: No palpable anomalies   Assessment  Primary Diagnosis & Pertinent Problem List: The primary encounter diagnosis was Cervical radiculopathy. Diagnoses of Neuroforaminal stenosis of cervical spine and Cervicalgia were also pertinent to this visit.  Status Diagnosis  Improved Improving Improved 1. Cervical radiculopathy   2. Neuroforaminal stenosis of cervical spine   3. Cervicalgia     Problems updated and reviewed during this visit: Problem  Neuroforaminal Stenosis of Cervical Spine  Cervicalgia  Cervical Radiculopathy   Patient with a history of C4-C7 ACDF who follows up status post left T2-T3 ESI on 11/27/2017.  Patient notes complete pain relief after her thoracic epidural steroid injection.  She is rating 0 pain at this time.  She states that she has her quality of life back and is able to perform  activities of daily living with greater ease that she was unable to perform before.  Patient is happy and pleased with results.  Can repeat procedure as needed.  Plan: -Repeat left T2/T3 ESI PRN  Plan of Care   Lab-work, procedure(s), and/or referral(s): Orders Placed This Encounter  Procedures  . Cervical Epidural Injection   Time Note: Greater than 50% of the 25 minute(s) of face-to-face time spent with Patricia [name], was spent in counseling/coordination of care regarding: Patricia [name] primary cause of pain, the treatment plan, treatment alternatives, the risks and possible complications of proposed treatment, going over the informed consent, the results, interpretation and significance of  her recent diagnostic interventional treatment(s) and realistic expectations.  Provider-requested follow-up: Return if symptoms worsen or fail to improve.  Future Appointments  Date Time Provider [place]  01/01/2018 12:45 PM [name], DO [facility]    Primary Care Physician: [name], MD Location: [facility] Note by: Gillis Santa, M.D Date: 12/25/2017; Time: 2:44 PM  Patient Instructions  To return as needed.

## 2017-12-25 NOTE — Patient Instructions (Signed)
To return as needed.
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
## 2018-01-01 ENCOUNTER — Encounter: Payer: Self-pay | Admitting: Family Medicine

## 2018-01-01 ENCOUNTER — Ambulatory Visit (INDEPENDENT_AMBULATORY_CARE_PROVIDER_SITE_OTHER): Payer: 59 | Admitting: Family Medicine

## 2018-01-01 VITALS — BP 118/82 | HR 76 | Ht 69.0 in | Wt 216.0 lb

## 2018-01-01 DIAGNOSIS — M5136 Other intervertebral disc degeneration, lumbar region: Secondary | ICD-10-CM | POA: Diagnosis not present

## 2018-01-01 DIAGNOSIS — M999 Biomechanical lesion, unspecified: Secondary | ICD-10-CM

## 2018-01-01 DIAGNOSIS — S86892A Other injury of other muscle(s) and tendon(s) at lower leg level, left leg, initial encounter: Secondary | ICD-10-CM | POA: Diagnosis not present

## 2018-01-01 NOTE — Assessment & Plan Note (Signed)
Decision today to treat with OMT was based on Physical Exam  After verbal consent patient was treated with HVLA, ME, FPR techniques in  thoracic, lumbar and sacral areas  Patient tolerated the procedure well with improvement in symptoms  Patient given exercises, stretches and lifestyle modifications  See medications in patient instructions if given  Patient will follow up in 8 weeks 

## 2018-01-01 NOTE — Patient Instructions (Signed)
Good to see you  Happy holidays!  See me agai in 6-8 weeks!

## 2018-01-01 NOTE — Assessment & Plan Note (Signed)
Tightness noted.  Discussed icing regimen and home exercises.  Discussed which activities to do which wants to avoid.  Increase activity as noted.  Patient is on this as well will follow-up with me again in 8 weeks

## 2018-01-01 NOTE — Assessment & Plan Note (Signed)
Stable at moment.  Continue the nitroglycerin.  Then will start to titrate off after 4 weeks.  Follow-up again in 6 weeks

## 2018-02-09 ENCOUNTER — Other Ambulatory Visit: Payer: Self-pay | Admitting: Internal Medicine

## 2018-02-09 MED ORDER — GABAPENTIN 100 MG PO CAPS: ORAL_CAPSULE | ORAL | 0 refills | Status: DC

## 2018-02-09 MED ORDER — PROGESTERONE MICRONIZED 200 MG PO CAPS: 200.0000 mg | ORAL_CAPSULE | Freq: Every day | ORAL | 2 refills | Status: DC
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
## 2018-02-19 ENCOUNTER — Encounter: Payer: Self-pay | Admitting: Family Medicine

## 2018-02-19 ENCOUNTER — Ambulatory Visit (INDEPENDENT_AMBULATORY_CARE_PROVIDER_SITE_OTHER): Payer: 59 | Admitting: Family Medicine

## 2018-02-19 VITALS — BP 108/62 | HR 81 | Ht 69.0 in | Wt 223.0 lb

## 2018-02-19 DIAGNOSIS — M999 Biomechanical lesion, unspecified: Secondary | ICD-10-CM | POA: Diagnosis not present

## 2018-02-19 DIAGNOSIS — M9981 Other biomechanical lesions of cervical region: Secondary | ICD-10-CM | POA: Diagnosis not present

## 2018-02-19 DIAGNOSIS — M5136 Other intervertebral disc degeneration, lumbar region: Secondary | ICD-10-CM | POA: Diagnosis not present

## 2018-02-19 DIAGNOSIS — S86892A Other injury of other muscle(s) and tendon(s) at lower leg level, left leg, initial encounter: Secondary | ICD-10-CM | POA: Diagnosis not present

## 2018-02-19 DIAGNOSIS — M4802 Spinal stenosis, cervical region: Secondary | ICD-10-CM

## 2018-02-19 NOTE — Assessment & Plan Note (Signed)
Decision today to treat with OMT was based on Physical Exam  After verbal consent patient was treated with HVLA, ME, FPR techniques in  thoracic, lumbar and sacral areas  Patient tolerated the procedure well with improvement in symptoms  Patient given exercises, stretches and lifestyle modifications  See medications in patient instructions if given  Patient will follow up in 4-8 weeks 

## 2018-02-19 NOTE — Patient Instructions (Signed)
Good to see you  Ice is your friend Watch the knee but looks good Back was decent See me again in 6-8 weeks

## 2018-02-19 NOTE — Assessment & Plan Note (Signed)
Stable.       - Continue to monitor

## 2018-02-19 NOTE — Assessment & Plan Note (Signed)
Degenerative disc disease.  Discussed posture and ergonomics.  Discussed which activities to do which wants to avoid.  Increase activity slowly over the course of neck several weeks.  Follow-up again in 4 to 8 weeks

## 2018-02-19 NOTE — Assessment & Plan Note (Signed)
Significant improvement.  Continue to monitor.  Patient has not used the nitroglycerin for 1 week and will stay off of it at this moment.

## 2018-02-26 ENCOUNTER — Telehealth: Payer: Self-pay

## 2018-02-26 NOTE — Telephone Encounter (Signed)
Called and spoke with pt because we had received an unread mychart message notification. So I called the pt to let her know that she could stop taking the mirapex since she was not taking it regularly. The pt stated that she has actually went back to taking it daily because the gabapentin did not help her and it made her really nauseous. The pt stated that she adjusted the time that she takes the mirapex and now it seems to be working just fine.

## 2018-02-26 NOTE — Telephone Encounter (Signed)
error 

## 2018-03-14 ENCOUNTER — Other Ambulatory Visit: Payer: Self-pay | Admitting: Internal Medicine

## 2018-03-14 NOTE — Telephone Encounter (Signed)
Looks like this medication was discontinued and hasn't been filled in a year.

## 2018-03-15 MED ORDER — PRAMIPEXOLE DIHYDROCHLORIDE 0.75 MG PO TABS: 0.7500 mg | ORAL_TABLET | Freq: Three times a day (TID) | ORAL | 2 refills | Status: DC

## 2018-03-21 ENCOUNTER — Other Ambulatory Visit: Payer: Self-pay | Admitting: Family Medicine

## 2018-03-21 MED ORDER — VITAMIN D (ERGOCALCIFEROL) 1.25 MG (50000 UNIT) PO CAPS: 50000.0000 [IU] | ORAL_CAPSULE | ORAL | 0 refills | Status: DC

## 2018-03-23 ENCOUNTER — Other Ambulatory Visit: Payer: Self-pay | Admitting: Certified Nurse Midwife

## 2018-03-23 MED ORDER — IMIQUIMOD 5 % EX CREA: TOPICAL_CREAM | CUTANEOUS | 0 refills | Status: DC

## 2018-03-30 ENCOUNTER — Other Ambulatory Visit: Payer: Self-pay | Admitting: Internal Medicine

## 2018-04-23 ENCOUNTER — Ambulatory Visit: Payer: Self-pay | Admitting: Family Medicine
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
## 2018-05-14 ENCOUNTER — Ambulatory Visit: Payer: Self-pay

## 2018-05-14 ENCOUNTER — Other Ambulatory Visit: Payer: Self-pay

## 2018-05-14 ENCOUNTER — Encounter: Payer: Self-pay | Admitting: Family Medicine

## 2018-05-14 ENCOUNTER — Ambulatory Visit: Payer: 59 | Admitting: Family Medicine

## 2018-05-14 VITALS — BP 118/78 | HR 73 | Ht 69.0 in | Wt 203.0 lb

## 2018-05-14 DIAGNOSIS — M999 Biomechanical lesion, unspecified: Secondary | ICD-10-CM

## 2018-05-14 DIAGNOSIS — M79671 Pain in right foot: Secondary | ICD-10-CM | POA: Diagnosis not present

## 2018-05-14 DIAGNOSIS — G5761 Lesion of plantar nerve, right lower limb: Secondary | ICD-10-CM

## 2018-05-14 DIAGNOSIS — M24021 Loose body in right elbow: Secondary | ICD-10-CM | POA: Diagnosis not present

## 2018-05-14 NOTE — Patient Instructions (Addendum)
Good to see you  Avoid being barefoot Exercises 3 times a week.  [employer] orthotics "total support" online would be great   Do not lace last eye on the shoes.  Try to pick up things underhand only  See me again in 5-6 weeks

## 2018-05-15 ENCOUNTER — Encounter: Payer: Self-pay | Admitting: Family Medicine

## 2018-05-15 DIAGNOSIS — G5761 Lesion of plantar nerve, right lower limb: Secondary | ICD-10-CM | POA: Insufficient documentation

## 2018-05-15 NOTE — Assessment & Plan Note (Signed)
Decision today to treat with OMT was based on Physical Exam  After verbal consent patient was treated with HVLA, ME, FPR techniques in cervical, thoracic, rib, lumbar and sacral areas  Patient tolerated the procedure well with improvement in symptoms  Patient given exercises, stretches and lifestyle modifications  See medications in patient instructions if given  Patient will follow up in 4 weeks 

## 2018-05-15 NOTE — Assessment & Plan Note (Signed)
Neuroma noted.  Discussed over-the-counter orthotics, discussed avoiding barefoot, forming a trainer.  Follow-up again in 4 to 6 weeks
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
## 2018-05-29 ENCOUNTER — Other Ambulatory Visit: Payer: Self-pay | Admitting: Internal Medicine

## 2018-05-29 NOTE — Telephone Encounter (Signed)
Refilled: 10/14/2018 Last OV: 07/12/2017 Next OV: 07/09/2018

## 2018-06-05 DIAGNOSIS — S129XXD Fracture of neck, unspecified, subsequent encounter: Secondary | ICD-10-CM | POA: Diagnosis not present

## 2018-06-05 DIAGNOSIS — M542 Cervicalgia: Secondary | ICD-10-CM | POA: Diagnosis not present

## 2018-06-18 ENCOUNTER — Ambulatory Visit: Payer: 59 | Admitting: Family Medicine

## 2018-06-20 DIAGNOSIS — Z1239 Encounter for other screening for malignant neoplasm of breast: Secondary | ICD-10-CM

## 2018-06-20 DIAGNOSIS — Z803 Family history of malignant neoplasm of breast: Secondary | ICD-10-CM

## 2018-06-20 DIAGNOSIS — E119 Type 2 diabetes mellitus without complications: Secondary | ICD-10-CM

## 2018-06-20 DIAGNOSIS — E079 Disorder of thyroid, unspecified: Secondary | ICD-10-CM

## 2018-06-20 DIAGNOSIS — I10 Essential (primary) hypertension: Secondary | ICD-10-CM

## 2018-06-25 ENCOUNTER — Ambulatory Visit: Payer: Self-pay

## 2018-06-25 ENCOUNTER — Ambulatory Visit: Payer: 59 | Admitting: Family Medicine

## 2018-06-25 ENCOUNTER — Encounter: Payer: Self-pay | Admitting: Family Medicine

## 2018-06-25 ENCOUNTER — Other Ambulatory Visit: Payer: Self-pay

## 2018-06-25 VITALS — BP 122/86 | HR 62 | Ht 69.0 in

## 2018-06-25 DIAGNOSIS — M5136 Other intervertebral disc degeneration, lumbar region: Secondary | ICD-10-CM

## 2018-06-25 DIAGNOSIS — M25521 Pain in right elbow: Secondary | ICD-10-CM

## 2018-06-25 DIAGNOSIS — M999 Biomechanical lesion, unspecified: Secondary | ICD-10-CM

## 2018-06-25 DIAGNOSIS — M7711 Lateral epicondylitis, right elbow: Secondary | ICD-10-CM
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
## 2018-06-25 NOTE — Patient Instructions (Signed)
Ice in 6 hours Keep trucking along See me again in 6 weeks

## 2018-06-25 NOTE — Assessment & Plan Note (Signed)
Injected today.  Tolerated the procedure well.  Discussed icing regimen and home exercises.  Discussed avoiding certain activities.  Patient will continue with the bracing.  Patient has had loose bodies previously in the elbow.  Patient will have new x-rays to further evaluate for posttraumatic arthritis.  MRI will be necessary the injection does not help

## 2018-06-25 NOTE — Assessment & Plan Note (Addendum)
Decision today to treat with OMT was based on Physical Exam  After verbal consent patient was treated with HVLA, ME, FPR techniques in  thoracic, rib lumbar and sacral areas  Patient tolerated the procedure well with improvement in symptoms  Patient given exercises, stretches and lifestyle modifications  See medications in patient instructions if given  Patient will follow up in 4-6 weeks

## 2018-06-25 NOTE — Assessment & Plan Note (Signed)
Known degenerative disc disease.  Discussed which activities to do which wants to avoid.  Patient should increase activity slowly.  Follow-up again in 4 to 8 weeks

## 2018-07-03 ENCOUNTER — Encounter: Payer: Self-pay | Admitting: Internal Medicine

## 2018-07-03 ENCOUNTER — Other Ambulatory Visit: Payer: Self-pay

## 2018-07-03 ENCOUNTER — Other Ambulatory Visit (INDEPENDENT_AMBULATORY_CARE_PROVIDER_SITE_OTHER): Payer: 59

## 2018-07-03 DIAGNOSIS — E079 Disorder of thyroid, unspecified: Secondary | ICD-10-CM

## 2018-07-03 DIAGNOSIS — E119 Type 2 diabetes mellitus without complications: Secondary | ICD-10-CM | POA: Diagnosis not present

## 2018-07-03 DIAGNOSIS — I10 Essential (primary) hypertension: Secondary | ICD-10-CM | POA: Diagnosis not present

## 2018-07-03 LAB — COMPREHENSIVE METABOLIC PANEL
ALT: 12 U/L (ref 0–35)
AST: 11 U/L (ref 0–37)
Albumin: 3.9 g/dL (ref 3.5–5.2)
Alkaline Phosphatase: 83 U/L (ref 39–117)
BUN: 11 mg/dL (ref 6–23)
CO2: 27 mEq/L (ref 19–32)
Calcium: 8.9 mg/dL (ref 8.4–10.5)
Chloride: 100 mEq/L (ref 96–112)
Creatinine, Ser: 0.7 mg/dL (ref 0.40–1.20)
GFR: 89.06 mL/min (ref >60.00)
Glucose, Bld: 120 mg/dL — ABNORMAL HIGH (ref 70–99)
Potassium: 4.1 mEq/L (ref 3.5–5.1)
Sodium: 135 mEq/L (ref 135–145)
Total Bilirubin: 0.6 mg/dL (ref 0.2–1.2)
Total Protein: 6.6 g/dL (ref 6.0–8.3)

## 2018-07-03 LAB — LIPID PANEL
Cholesterol: 200 mg/dL (ref 0–200)
HDL: 44.3 mg/dL (ref >39.00)
LDL Cholesterol: 126 mg/dL — ABNORMAL HIGH (ref 0–99)
NonHDL: 155.62
Total CHOL/HDL Ratio: 5
Triglycerides: 146 mg/dL (ref 0.0–149.0)
VLDL: 29.2 mg/dL (ref 0.0–40.0)

## 2018-07-03 LAB — HEMOGLOBIN A1C: Hgb A1c MFr Bld: 6.5 % (ref 4.6–6.5)

## 2018-07-03 LAB — TSH: TSH: 2.42 u[IU]/mL (ref 0.35–4.50)

## 2018-07-09 ENCOUNTER — Encounter: Payer: 59 | Admitting: Internal Medicine

## 2018-07-10 IMAGING — MG MM DIGITAL DIAGNOSTIC UNILAT*R* W/ TOMO W/ CAD
6 series · 6 of 14 positions shown · non-contrast
Comparison: Previous exam(s).

CLINICAL DATA: Follow-up of probably benign right breast
asymmetries

EXAM:
2D DIGITAL DIAGNOSTIC UNILATERAL RIGHT MAMMOGRAM WITH CAD AND
ADJUNCT TOMO

[R CC]
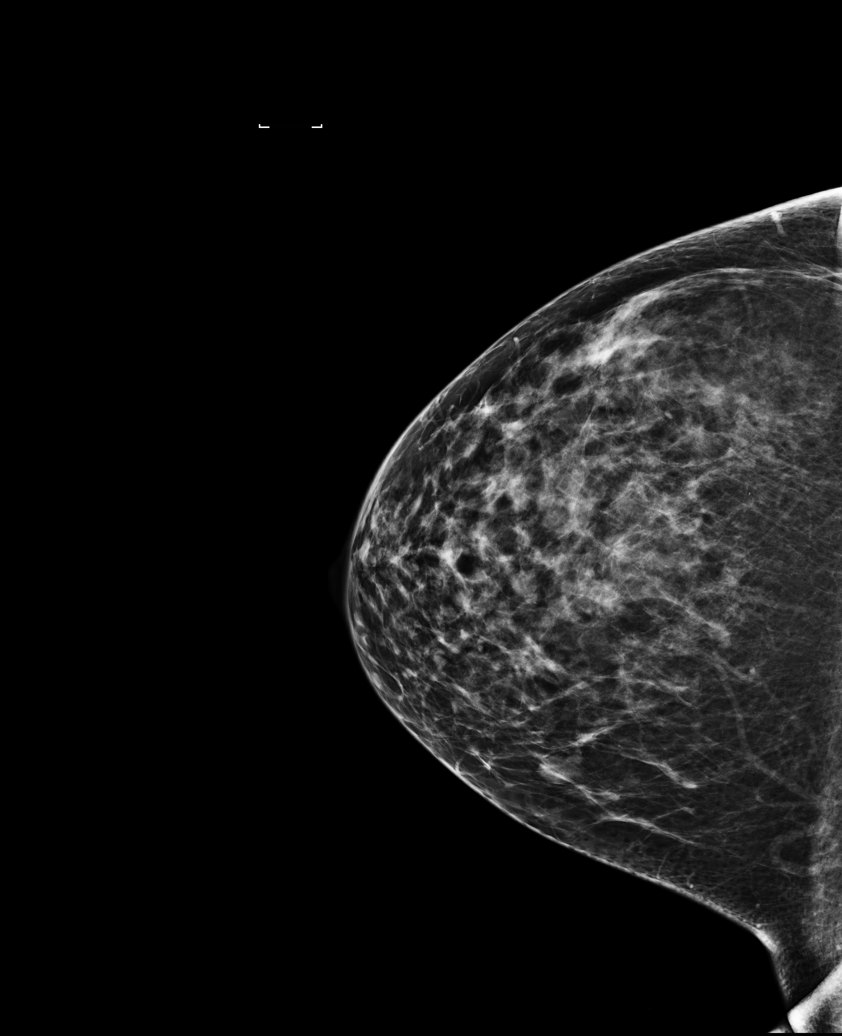

[R MLO]
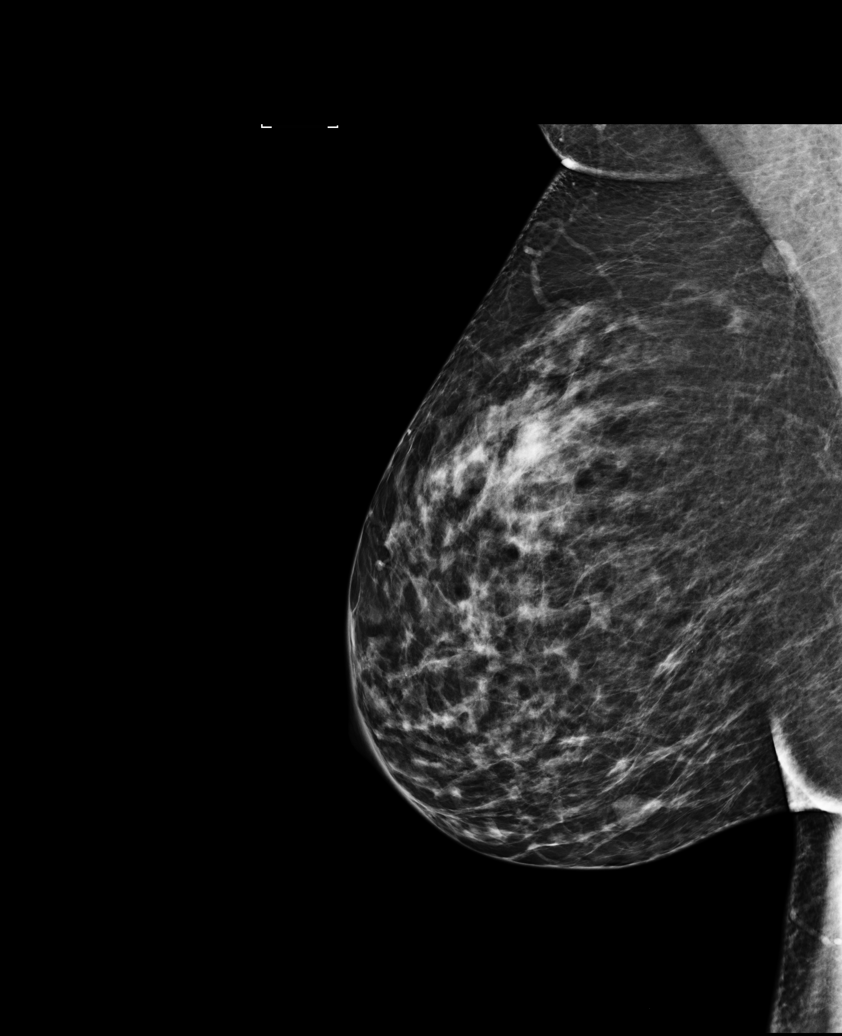

[R CC synth-2D]
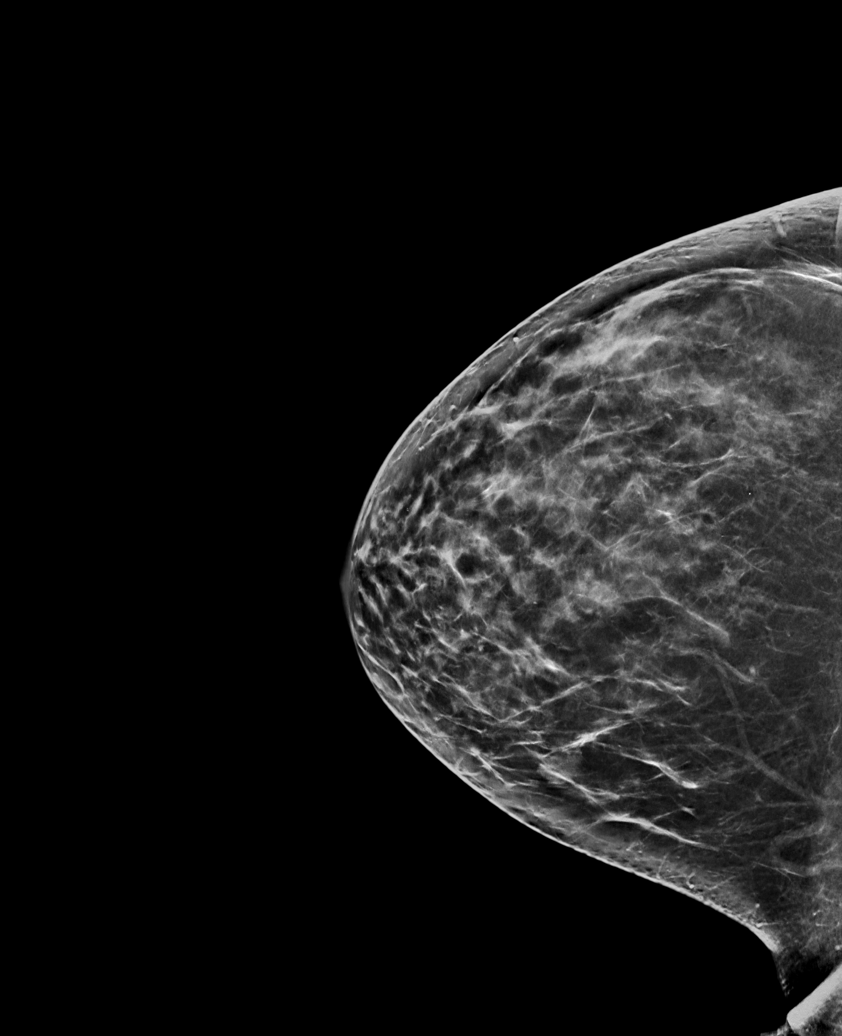

[R MLO synth-2D]
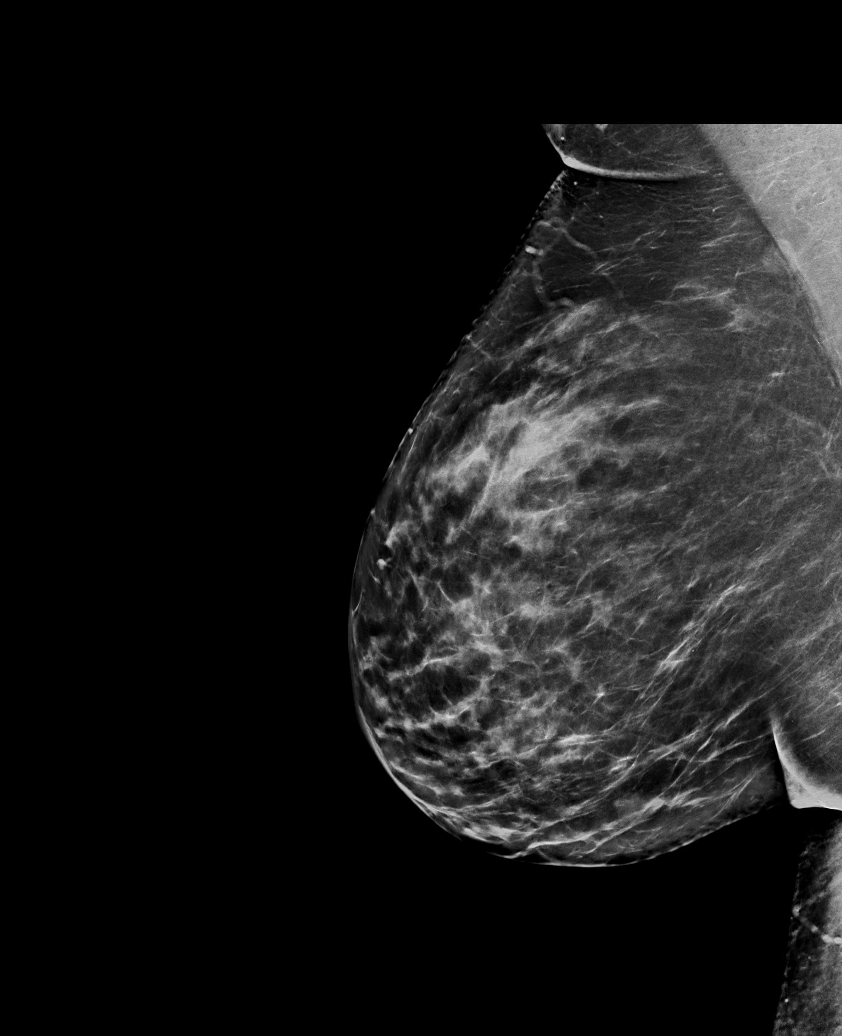

[R CC tomo · tomo slice 41/80.0]
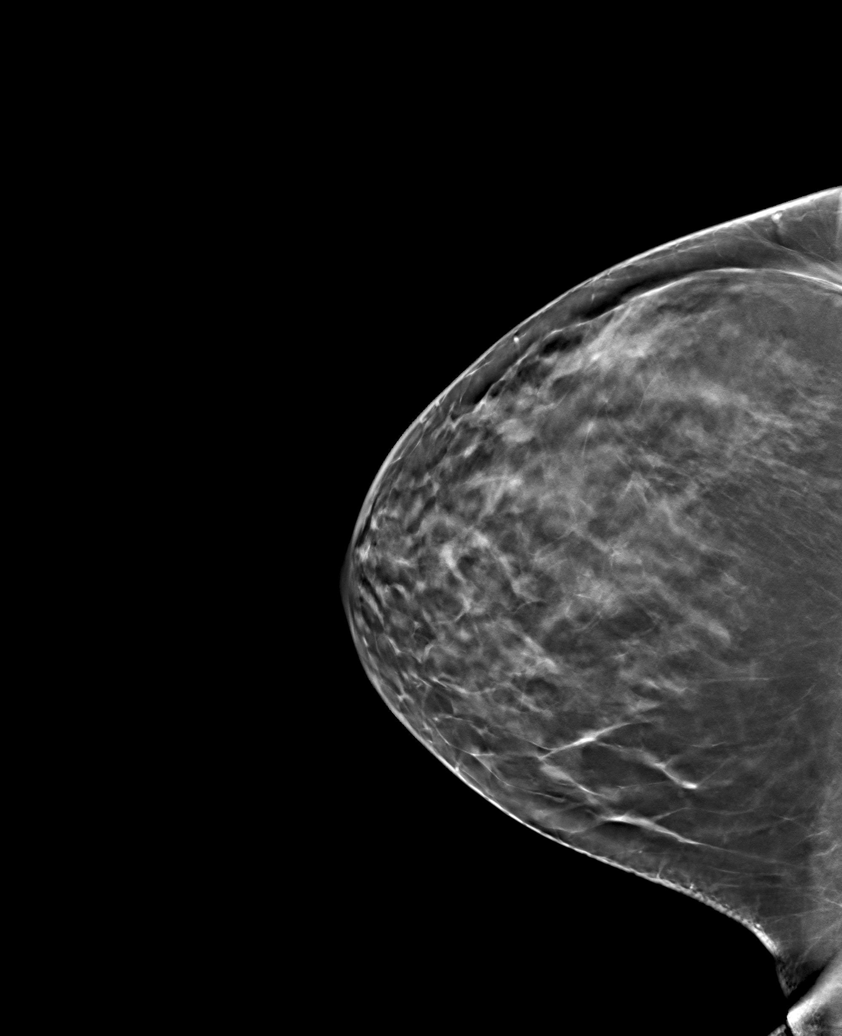

[R MLO tomo · tomo slice 45/90.0]
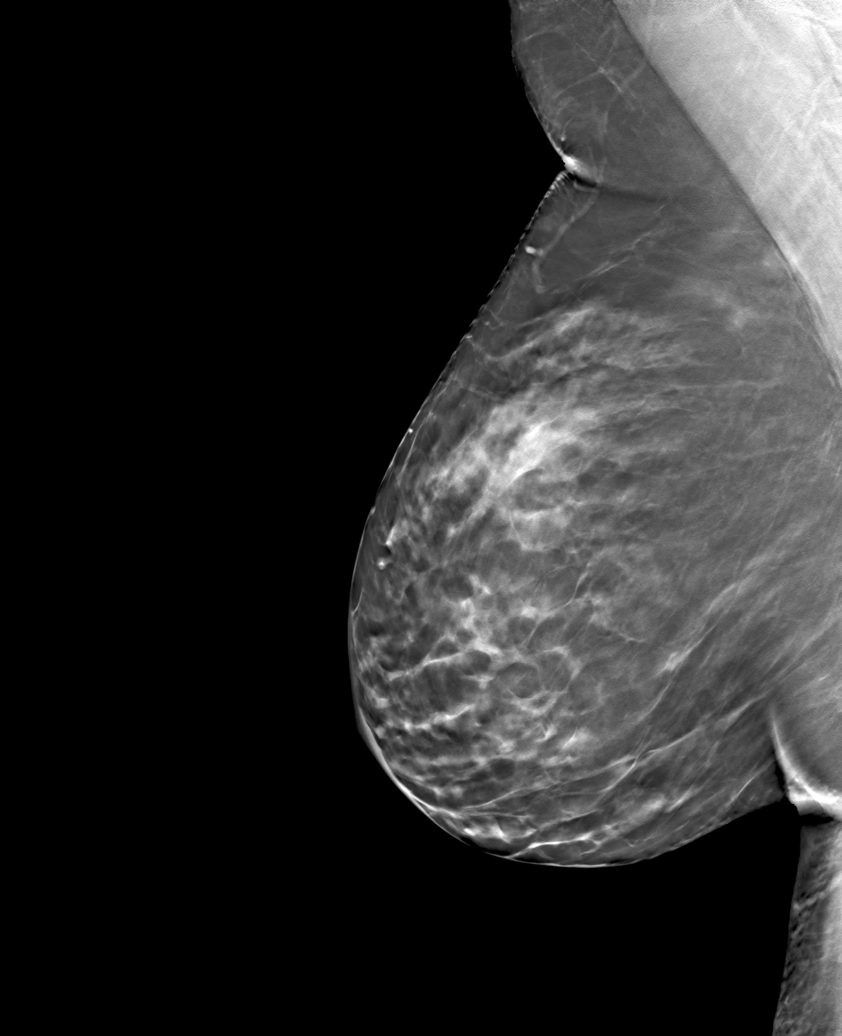

[6 of 14 positions shown; findings below may reference images not displayed]

ACR Breast Density Category c: The breast tissue is heterogeneously
dense, which may obscure small masses.
FINDINGS: The right breast parenchymal pattern appears unchanged. No new or
suspicious abnormality is identified.

Mammographic images were processed with CAD.
IMPRESSION: Probably benign right breast asymmetries.

RECOMMENDATION:
Bilateral diagnostic mammogram and possible ultrasound in 5 months.

I have discussed the findings and recommendations with the patient.
Results were also provided in writing at the conclusion of the
visit. If applicable, a reminder letter will be sent to the patient
regarding the next appointment.

BI-RADS CATEGORY  3: Probably benign.

## 2018-07-23 ENCOUNTER — Other Ambulatory Visit: Payer: Self-pay | Admitting: Family Medicine

## 2018-08-08 ENCOUNTER — Other Ambulatory Visit: Payer: Self-pay

## 2018-08-09 ENCOUNTER — Ambulatory Visit
Admission: RE | Admit: 2018-08-09 | Discharge: 2018-08-09 | Disposition: A | Discharge status: Home / Self Care | Payer: 59 | Source: Ambulatory Visit | Attending: Internal Medicine | Admitting: Internal Medicine

## 2018-08-09 DIAGNOSIS — Z1231 Encounter for screening mammogram for malignant neoplasm of breast: Secondary | ICD-10-CM | POA: Insufficient documentation

## 2018-08-09 DIAGNOSIS — Z1239 Encounter for other screening for malignant neoplasm of breast: Secondary | ICD-10-CM

## 2018-08-10 ENCOUNTER — Ambulatory Visit (INDEPENDENT_AMBULATORY_CARE_PROVIDER_SITE_OTHER): Payer: 59 | Admitting: Internal Medicine

## 2018-08-10 ENCOUNTER — Other Ambulatory Visit: Payer: Self-pay

## 2018-08-10 ENCOUNTER — Encounter: Payer: Self-pay | Admitting: Internal Medicine

## 2018-08-10 VITALS — BP 126/84 | HR 74 | Temp 98.1°F | Resp 16 | Ht 69.0 in | Wt 195.0 lb

## 2018-08-10 DIAGNOSIS — E1169 Type 2 diabetes mellitus with other specified complication: Secondary | ICD-10-CM

## 2018-08-10 DIAGNOSIS — F320 Major depressive disorder, single episode, mild: Secondary | ICD-10-CM

## 2018-08-10 DIAGNOSIS — E785 Hyperlipidemia, unspecified: Secondary | ICD-10-CM

## 2018-08-10 DIAGNOSIS — E119 Type 2 diabetes mellitus without complications: Secondary | ICD-10-CM

## 2018-08-10 DIAGNOSIS — I10 Essential (primary) hypertension: Secondary | ICD-10-CM | POA: Diagnosis not present

## 2018-08-10 DIAGNOSIS — Z0000 Encounter for general adult medical examination without abnormal findings: Secondary | ICD-10-CM | POA: Diagnosis not present

## 2018-08-10 NOTE — Patient Instructions (Signed)
Good to see you!!  If you want to repeat A1c in 3 months, let me know,. If not 6 months is fine   Health Maintenance, Female Adopting a healthy lifestyle and getting preventive care are important in promoting health and wellness. Ask your health care provider about:  The right schedule for you to have regular tests and exams.  Things you can do on your own to prevent diseases and keep yourself healthy. What should I know about diet, weight, and exercise? Eat a healthy diet   Eat a diet that includes plenty of vegetables, fruits, low-fat dairy products, and lean protein.  Do not eat a lot of foods that are high in solid fats, added sugars, or sodium. Maintain a healthy weight Body mass index (BMI) is used to identify weight problems. It estimates body fat based on height and weight. Your health care provider can help determine your BMI and help you achieve or maintain a healthy weight. Get regular exercise Get regular exercise. This is one of the most important things you can do for your health. Most adults should:  Exercise for at least 150 minutes each week. The exercise should increase your heart rate and make you sweat (moderate-intensity exercise).  Do strengthening exercises at least twice a week. This is in addition to the moderate-intensity exercise.  Spend less time sitting. Even light physical activity can be beneficial. Watch cholesterol and blood lipids Have your blood tested for lipids and cholesterol at 48 years of age, then have this test every 5 years. Have your cholesterol levels checked more often if:  Your lipid or cholesterol levels are high.  You are older than 49 years of age.  You are at high risk for heart disease. What should I know about cancer screening? Depending on your health history and family history, you may need to have cancer screening at various ages. This may include screening for:  Breast cancer.  Cervical cancer.  Colorectal cancer.   Skin cancer.  Lung cancer. What should I know about heart disease, diabetes, and high blood pressure? Blood pressure and heart disease  High blood pressure causes heart disease and increases the risk of stroke. This is more likely to develop in people who have high blood pressure readings, are of African descent, or are overweight.  Have your blood pressure checked: ? Every 3-5 years if you are 13-47 years of age. ? Every year if you are 79 years old or older. Diabetes Have regular diabetes screenings. This checks your fasting blood sugar level. Have the screening done:  Once every three years after age 29 if you are at a normal weight and have a low risk for diabetes.  More often and at a younger age if you are overweight or have a high risk for diabetes. What should I know about preventing infection? Hepatitis B If you have a higher risk for hepatitis B, you should be screened for this virus. Talk with your health care provider to find out if you are at risk for hepatitis B infection. Hepatitis C Testing is recommended for:  Everyone born from [birth year] through 1966.  Anyone with known risk factors for hepatitis C. Sexually transmitted infections (STIs)  Get screened for STIs, including gonorrhea and chlamydia, if: ? You are sexually active and are younger than 49 years of age. ? You are older than 49 years of age and your health care provider tells you that you are at risk for this type of infection. ? Your  sexual activity has changed since you were last screened, and you are at increased risk for chlamydia or gonorrhea. Ask your health care provider if you are at risk.  Ask your health care provider about whether you are at high risk for HIV. Your health care provider may recommend a prescription medicine to help prevent HIV infection. If you choose to take medicine to prevent HIV, you should first get tested for HIV. You should then be tested every 3 months for as long as you are  taking the medicine. Pregnancy  If you are about to stop having your period (premenopausal) and you may become pregnant, seek counseling before you get pregnant.  Take 400 to 800 micrograms (mcg) of folic acid every day if you become pregnant.  Ask for birth control (contraception) if you want to prevent pregnancy. Osteoporosis and menopause Osteoporosis is a disease in which the bones lose minerals and strength with aging. This can result in bone fractures. If you are 76 years old or older, or if you are at risk for osteoporosis and fractures, ask your health care provider if you should:  Be screened for bone loss.  Take a calcium or vitamin D supplement to lower your risk of fractures.  Be given hormone replacement therapy (HRT) to treat symptoms of menopause. Follow these instructions at home: Lifestyle  Do not use any products that contain nicotine or tobacco, such as cigarettes, e-cigarettes, and chewing tobacco. If you need help quitting, ask your health care provider.  Do not use street drugs.  Do not share needles.  Ask your health care provider for help if you need support or information about quitting drugs. Alcohol use  Do not drink alcohol if: ? Your health care provider tells you not to drink. ? You are pregnant, may be pregnant, or are planning to become pregnant.  If you drink alcohol: ? Limit how much you use to 0-1 drink a day. ? Limit intake if you are breastfeeding.  Be aware of how much alcohol is in your drink. In the [place] one drink equals one 12 oz bottle of beer (355 mL), one 5 oz glass of wine (148 mL), or one 1 oz glass of hard liquor (44 mL). General instructions  Schedule regular health, dental, and eye exams.  Stay current with your vaccines.  Tell your health care provider if: ? You often feel depressed. ? You have ever been abused or do not feel safe at home. Summary  Adopting a healthy lifestyle and getting preventive care are important  in promoting health and wellness.  Follow your health care provider's instructions about healthy diet, exercising, and getting tested or screened for diseases.  Follow your health care provider's instructions on monitoring your cholesterol and blood pressure. This information is not intended to replace advice given to you by your health care provider. Make sure you discuss any questions you have with your health care provider. Document Released: 07/12/2010 Document Revised: 12/20/2017 Document Reviewed: 12/20/2017 Elsevier Patient Education  2020 [employer].

## 2018-08-10 NOTE — Progress Notes (Signed)
Patient ID: Patricia [name], female    DOB: July 25, 1970  Age: 48 y.o. MRN: [id number]  The patient is here for  annual preventive examination and management of other chronic and acute problems.   The risk factors are reflected in the social history.  The roster of all physicians providing medical care to patient - is listed in the Snapshot section of the chart.  Activities of daily living:  The patient is 100% independent in all ADLs: dressing, toileting, feeding as well as independent mobility  Home safety : The patient has smoke detectors in the home. They wear seatbelts.  There are no firearms at home. There is no violence in the home.   There is no risks for hepatitis, STDs or HIV. There is no   history of blood transfusion. They have no travel history to infectious disease endemic areas of the world.  The patient has seen their dentist in the last six month. They have seen their eye doctor in the last year. They deny hearing difficulty .   They do not  have excessive sun exposure. Discussed the need for sun protection: hats, long sleeves and use of sunscreen if there is significant sun exposure.   Diet: the importance of a healthy diet is discussed. They do have a healthy diet.  The benefits of regular aerobic exercise were discussed. She walks 4 times per week ,  30 minutes.   Depression screen: there are no signs or vegative symptoms of depression- irritability, change in appetite, anhedonia, sadness/tearfullness.  The following portions of the patient's history were reviewed and updated as appropriate: allergies, current medications, past family history, past medical history,  past surgical history, past social history  and problem list.  Visual acuity was not assessed per patient preference since she has regular follow up with her ophthalmologist. Hearing and body mass index were assessed and reviewed.   During the course of the visit the patient was educated and counseled  about appropriate screening and preventive services including : fall prevention , diabetes screening, nutrition counseling, colorectal cancer screening, and recommended immunizations.    CC: Diagnoses of Current mild episode of major depressive disorder without prior episode [facility]), Visit for preventive health examination, Diabetes mellitus without complication ([place]), Hyperlipidemia associated with type 2 diabetes mellitus ([place]), and Essential hypertension were pertinent to this visit.  History Patricia [name] has a past medical history of Cervical spondylosis with myelopathy and radiculopathy (01/2017), Concussion (07/2016), Hypertension, Premature atrial contractions, and Thyroid disease.   She has a past surgical history that includes Abdominal hysterectomy (Dec 2012); Tonsillectomy (May 2012); transthoracic echocardiogram (04/2014); 48 hour Holter Monitor (05/04/2014); Breast lumpectomy (Right, 1991 or 1992); Breast excisional biopsy (Right, 1990); and ACDL (02/06/2017).   Her family history includes Alcohol abuse in her father; Breast cancer in her paternal grandmother; Breast cancer (age of onset: 108) in her mother and another family member; COPD in her father; Cancer in her paternal grandmother; Diabetes in her brother and mother; Heart failure in her father; Hyperlipidemia in her mother; Hypertension in her mother; Mental illness in her father; Mitral valve prolapse in her father.She reports that she has never smoked. She has never used smokeless tobacco. She reports current alcohol use. She reports that she does not use drugs.  Outpatient Medications Prior to Visit  Medication Sig Dispense Refill  . cyclobenzaprine (FLEXERIL) 10 MG tablet TAKE 1 TABLET (10 MG TOTAL) BY MOUTH 3 (THREE) TIMES DAILY AS NEEDED FOR MUSCLE SPASMS. 90 tablet 2  .  ibuprofen (ADVIL,MOTRIN) 800 MG tablet TAKE 1 TABLET BY MOUTH 3 TIMES DAILY AS NEEDED 90 tablet 1  . loratadine (CLARITIN) 10 MG tablet Take 1 tablet (10 mg total) by  mouth daily. 90 tablet 2  . losartan (COZAAR) 100 MG tablet TAKE 1 TABLET BY MOUTH DAILY 270 tablet 0  . montelukast (SINGULAIR) 10 MG tablet TAKE 1 TABLET (10 MG TOTAL) BY MOUTH AT BEDTIME. 90 tablet 3  . pramipexole (MIRAPEX) 0.75 MG tablet Take 1 tablet (0.75 mg total) by mouth 3 (three) times daily. 90 tablet 2  . progesterone (PROMETRIUM) 200 MG capsule Take 1 capsule (200 mg total) by mouth daily. 90 capsule 2  . Vitamin D, Ergocalciferol, (DRISDOL) 1.25 MG (50000 UT) CAPS capsule TAKE 1 CAPSULE (50,000 UNITS TOTAL) BY MOUTH EVERY 7 (SEVEN) DAYS. 12 capsule 0  . zolpidem (AMBIEN) 10 MG tablet TAKE 1 TABLET BY MOUTH ONCE DAILY AT BEDTIME AS NEEDED FOR SLEEP 30 tablet 5  . amLODipine (NORVASC) 5 MG tablet Take 5 mg by mouth.    . imiquimod (ALDARA) 5 % cream Apply topically 3 (three) times a week. 12 each 0  . metFORMIN (GLUCOPHAGE-XR) 500 MG 24 hr tablet TAKE 2 TABLETS BY MOUTH DAILY WITH BREAKFAST (Patient not taking: Reported on 08/10/2018) 180 tablet 1  . pyridOXINE (VITAMIN B-6) 100 MG tablet Take 100 mg by mouth daily.     No facility-administered medications prior to visit.     Review of Systems   Patient denies headache, fevers, malaise, unintentional weight loss, skin rash, eye pain, sinus congestion and sinus pain, sore throat, dysphagia,  hemoptysis , cough, dyspnea, wheezing, chest pain, palpitations, orthopnea, edema, abdominal pain, nausea, melena, diarrhea, constipation, flank pain, dysuria, hematuria, urinary  Frequency, nocturia, numbness, tingling, seizures,  Focal weakness, Loss of consciousness,  Tremor, insomnia, depression, anxiety, and suicidal ideation.      Objective:  BP 126/84 (BP Location: Right Arm, Patient Position: Sitting, Cuff Size: Large)   Pulse 74   Temp 98.1 F (36.7 C) (Oral)   Resp 16   Ht 5\' 9"  (1.753 m)   Wt 195 lb (88.5 kg)   SpO2 99%   BMI 28.80 kg/m   Physical Exam   General appearance: alert, cooperative and appears stated age Ears:  normal TM's and external ear canals both ears Throat: lips, mucosa, and tongue normal; teeth and gums normal Neck: no adenopathy, no carotid bruit, supple, symmetrical, trachea midline and thyroid not enlarged, symmetric, no tenderness/mass/nodules Back: symmetric, no curvature. ROM normal. No CVA tenderness. Lungs: clear to auscultation bilaterally Heart: regular rate and rhythm, S1, S2 normal, no murmur, click, rub or gallop Abdomen: soft, non-tender; bowel sounds normal; no masses,  no organomegaly Pulses: 2+ and symmetric Skin: Skin color, texture, turgor normal. No rashes or lesions Lymph nodes: Cervical, supraclavicular, and axillary nodes normal.   Assessment & Plan:   Problem List Items Addressed This Visit      Unprioritized   Visit for preventive health examination    Annual comprehensive preventive exam was done as well as an evaluation and management of chronic conditions .  During the course of the visit the patient was educated and counseled about appropriate screening and preventive services including :  diabetes screening, lipid analysis with projected  10 year  risk for CAD at 11% using the [facility] , nutrition counseling, colorectal cancer screening, and recommended immunizations.  Printed recommendations for health maintenance screenings was given.        RESOLVED: Major  depressive disorder, single episode    age appropriate education and counseling updated, referrals for preventative services and immunizations addressed, dietary and smoking counseling addressed, most recent labs reviewed.  I have personally reviewed and have noted:  1) the patient's medical and social history 2) The pt's use of alcohol, tobacco, and illicit drugs 3) The patient's current medications and supplements 4) Functional ability including ADL's, fall risk, home safety risk, hearing and visual impairment 5) Diet and physical activities 6) Evidence for depression or mood disorder 7) The patient's  height, weight, and BMI have been recorded in the chart  I have made referrals, and provided counseling and education based on review of the above      Hyperlipidemia associated with type 2 diabetes mellitus ([place])    10 yr risk of CAD is 10.6% using [facility].  No therapy accepted  at this time given her desire to lower with diet and exercise,  Recheck 6 months       Essential hypertension    Well controlled on current regimen of losartan. Renal function stable, no changes today.  Lab Results  Component Value Date   CREATININE 0.70 07/03/2018   Lab Results  Component Value Date   NA 135 07/03/2018   K 4.1 07/03/2018   CL 100 07/03/2018   CO2 27 07/03/2018         Diabetes mellitus without complication ([place])     Historically well-controlled on diet alone .  hemoglobin A1c has been consistently at or  less than 7.0 . Patient is up-to-date on eye exams and foot exam is normal today. Patient has no microalbuminuria. Patient is tolerating statin therapy for CAD risk reduction and on ACE/ARB for renal protection and hypertension   Lab Results  Component Value Date   HGBA1C 6.5 07/03/2018   Lab Results  Component Value Date   MICROALBUR <0.7 06/07/2017            I have discontinued Patricia [name]'s amLODipine, pyridOXINE, metFORMIN, and imiquimod. I am also having her maintain her loratadine, cyclobenzaprine, montelukast, progesterone, pramipexole, losartan, ibuprofen, zolpidem, and Vitamin D (Ergocalciferol).  No orders of the defined types were placed in this encounter.   Medications Discontinued During This Encounter  Medication Reason  . pyridOXINE (VITAMIN B-6) 100 MG tablet Error  . imiquimod (ALDARA) 5 % cream Error  . amLODipine (NORVASC) 5 MG tablet Change in therapy  . metFORMIN (GLUCOPHAGE-XR) 500 MG 24 hr tablet Error    Follow-up: No follow-ups on file.   [name], MD

## 2018-08-11 NOTE — Assessment & Plan Note (Signed)
Stable overall.  Discussed icing regimen, home exercises, topical anti-inflammatories.  Discussed which activities of doing which wants to avoid.  Follow-up again in 4 to 8 weeks
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
## 2018-08-11 NOTE — Assessment & Plan Note (Signed)
Decision today to treat with OMT was based on Physical Exam  After verbal consent patient was treated with HVLA, ME, FPR techniques in  thoracic, lumbar and sacral areas  Patient tolerated the procedure well with improvement in symptoms  Patient given exercises, stretches and lifestyle modifications  See medications in patient instructions if given  Patient will follow up in 4-8 weeks 

## 2018-08-12 ENCOUNTER — Encounter: Payer: Self-pay | Admitting: Internal Medicine

## 2018-08-12 DIAGNOSIS — E1169 Type 2 diabetes mellitus with other specified complication: Secondary | ICD-10-CM | POA: Insufficient documentation

## 2018-08-12 DIAGNOSIS — E785 Hyperlipidemia, unspecified: Secondary | ICD-10-CM | POA: Insufficient documentation

## 2018-08-12 NOTE — Assessment & Plan Note (Signed)

## 2018-08-12 NOTE — Assessment & Plan Note (Addendum)
Annual comprehensive preventive exam was done as well as an evaluation and management of chronic conditions .  During the course of the visit the patient was educated and counseled about appropriate screening and preventive services including :  diabetes screening, lipid analysis with projected  10 year  risk for CAD at 11% using the [facility] , nutrition counseling, colorectal cancer screening, and recommended immunizations.  Printed recommendations for health maintenance screenings was given.

## 2018-08-12 NOTE — Assessment & Plan Note (Signed)
Historically well-controlled on diet alone .  hemoglobin A1c has been consistently at or  less than 7.0 . Patient is up-to-date on eye exams and foot exam is normal today. Patient has no microalbuminuria. Patient is tolerating statin therapy for CAD risk reduction and on ACE/ARB for renal protection and hypertension   Lab Results  Component Value Date   HGBA1C 6.5 07/03/2018   Lab Results  Component Value Date   MICROALBUR <0.7 06/07/2017

## 2018-08-12 NOTE — Assessment & Plan Note (Signed)
10 yr risk of CAD is 10.6% using FRC.  No therapy accepted  at this time given her desire to lower with diet and exercise,  Recheck 6 months

## 2018-08-12 NOTE — Assessment & Plan Note (Signed)
Well controlled on current regimen of losartan. Renal function stable, no changes today.  Lab Results  Component Value Date   CREATININE 0.70 07/03/2018   Lab Results  Component Value Date   NA 135 07/03/2018   K 4.1 07/03/2018   CL 100 07/03/2018   CO2 27 07/03/2018

## 2018-08-13 ENCOUNTER — Encounter: Payer: Self-pay | Admitting: Family Medicine

## 2018-08-13 ENCOUNTER — Ambulatory Visit (INDEPENDENT_AMBULATORY_CARE_PROVIDER_SITE_OTHER): Payer: 59 | Admitting: Family Medicine

## 2018-08-13 ENCOUNTER — Other Ambulatory Visit: Payer: Self-pay | Admitting: Internal Medicine

## 2018-08-13 ENCOUNTER — Other Ambulatory Visit: Payer: Self-pay

## 2018-08-13 VITALS — BP 130/80 | HR 78 | Ht 69.0 in | Wt 204.0 lb

## 2018-08-13 DIAGNOSIS — M999 Biomechanical lesion, unspecified: Secondary | ICD-10-CM | POA: Diagnosis not present

## 2018-08-13 DIAGNOSIS — M5136 Other intervertebral disc degeneration, lumbar region: Secondary | ICD-10-CM | POA: Diagnosis not present

## 2018-08-13 DIAGNOSIS — M51369 Other intervertebral disc degeneration, lumbar region without mention of lumbar back pain or lower extremity pain: Secondary | ICD-10-CM

## 2018-08-13 MED ORDER — AMOXICILLIN-POT CLAVULANATE 875-125 MG PO TABS: 1.0000 | ORAL_TABLET | Freq: Two times a day (BID) | ORAL | 0 refills | Status: AC

## 2018-08-13 NOTE — Patient Instructions (Signed)
Good to see you See me again in 6-8 weeks 

## 2018-09-10 DIAGNOSIS — H524 Presbyopia: Secondary | ICD-10-CM | POA: Diagnosis not present

## 2018-09-10 DIAGNOSIS — Z7984 Long term (current) use of oral hypoglycemic drugs: Secondary | ICD-10-CM | POA: Diagnosis not present

## 2018-09-10 DIAGNOSIS — H52223 Regular astigmatism, bilateral: Secondary | ICD-10-CM | POA: Diagnosis not present

## 2018-09-10 DIAGNOSIS — E119 Type 2 diabetes mellitus without complications: Secondary | ICD-10-CM | POA: Diagnosis not present

## 2018-09-10 DIAGNOSIS — H5213 Myopia, bilateral: Secondary | ICD-10-CM | POA: Diagnosis not present

## 2018-09-30 NOTE — Assessment & Plan Note (Signed)
Decision today to treat with OMT was based on Physical Exam  After verbal consent patient was treated with HVLA, ME, FPR techniques in cervical, thoracic, lumbar and sacral areas  Patient tolerated the procedure well with improvement in symptoms  Patient given exercises, stretches and lifestyle modifications  See medications in patient instructions if given  Patient will follow up in 6-8 weeks 
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
## 2018-09-30 NOTE — Assessment & Plan Note (Signed)
Degenerative disc disease.  Discussed which activities to do which wants to avoid.  Discussed home exercises and icing regimen.  Patient continues to respond well to osteopathic manipulation follow-up again in 6 to 8 weeks

## 2018-10-01 ENCOUNTER — Encounter: Payer: Self-pay | Admitting: Family Medicine

## 2018-10-01 ENCOUNTER — Ambulatory Visit: Payer: 59 | Admitting: Family Medicine

## 2018-10-01 ENCOUNTER — Other Ambulatory Visit: Payer: Self-pay

## 2018-10-01 VITALS — BP 122/82 | HR 84 | Ht 69.0 in | Wt 201.8 lb

## 2018-10-01 DIAGNOSIS — M999 Biomechanical lesion, unspecified: Secondary | ICD-10-CM | POA: Diagnosis not present

## 2018-10-01 DIAGNOSIS — M9981 Other biomechanical lesions of cervical region: Secondary | ICD-10-CM | POA: Diagnosis not present

## 2018-10-01 DIAGNOSIS — M5136 Other intervertebral disc degeneration, lumbar region: Secondary | ICD-10-CM | POA: Diagnosis not present

## 2018-10-01 DIAGNOSIS — Z9889 Other specified postprocedural states: Secondary | ICD-10-CM | POA: Diagnosis not present

## 2018-10-01 DIAGNOSIS — M4802 Spinal stenosis, cervical region: Secondary | ICD-10-CM

## 2018-10-01 NOTE — Assessment & Plan Note (Signed)
Patient has had difficulty previously.  We discussed with patient in great length Things can cause an exacerbation or adjacent segment disease.  Did not want to make significant changes in terms of avoiding any high impact with the neck.

## 2018-10-01 NOTE — Patient Instructions (Signed)
See me again in 5 weeks 

## 2018-10-09 NOTE — Telephone Encounter (Signed)
Flonase is not in current medication list is it okay to refill?

## 2018-10-10 MED ORDER — FLUTICASONE PROPIONATE 50 MCG/ACT NA SUSP: 2.0000 | Freq: Every day | NASAL | 6 refills | Status: DC

## 2018-11-02 ENCOUNTER — Other Ambulatory Visit: Payer: Self-pay | Admitting: Family Medicine

## 2018-11-02 ENCOUNTER — Other Ambulatory Visit: Payer: Self-pay | Admitting: Internal Medicine

## 2018-11-05 ENCOUNTER — Encounter: Payer: Self-pay | Admitting: Family Medicine

## 2018-11-05 ENCOUNTER — Other Ambulatory Visit: Payer: Self-pay

## 2018-11-05 ENCOUNTER — Ambulatory Visit (INDEPENDENT_AMBULATORY_CARE_PROVIDER_SITE_OTHER): Payer: 59 | Admitting: Family Medicine

## 2018-11-05 VITALS — BP 102/72 | HR 82 | Ht 69.0 in | Wt 201.0 lb

## 2018-11-05 DIAGNOSIS — M25511 Pain in right shoulder: Secondary | ICD-10-CM | POA: Insufficient documentation

## 2018-11-05 DIAGNOSIS — M999 Biomechanical lesion, unspecified: Secondary | ICD-10-CM | POA: Diagnosis not present

## 2018-11-05 DIAGNOSIS — M25512 Pain in left shoulder: Secondary | ICD-10-CM | POA: Insufficient documentation

## 2018-11-05 DIAGNOSIS — M5410 Radiculopathy, site unspecified: Secondary | ICD-10-CM | POA: Diagnosis not present

## 2018-11-05 MED ORDER — DULOXETINE HCL 20 MG PO CPEP: 20.0000 mg | ORAL_CAPSULE | Freq: Every day | ORAL | 0 refills | Status: DC

## 2018-11-05 MED ORDER — PREDNISONE 50 MG PO TABS: ORAL_TABLET | ORAL | 0 refills | Status: DC

## 2018-11-05 MED ORDER — MELOXICAM 15 MG PO TABS: 15.0000 mg | ORAL_TABLET | Freq: Every day | ORAL | 0 refills | Status: DC

## 2018-11-05 NOTE — Patient Instructions (Signed)
Good to see you for PRP in about 2 weeks

## 2018-11-05 NOTE — Assessment & Plan Note (Signed)
Patient given injection, tolerated the procedure well, we did this in the rhomboid, trapezius, as well as the levator muscle.  I do believe that patient has significant amount of tightness.  Some of it could be more radicular symptoms but I do think that there is some anxiety that is causing more of a psychosomatic as well.  Follow-up again in 4 to 8 weeks started on Cymbalta

## 2018-11-05 NOTE — Assessment & Plan Note (Signed)
Decision today to treat with OMT was based on Physical Exam  After verbal consent patient was treated with HVLA, ME, FPR techniques in  thoracic, lumbar and sacral areas  Patient tolerated the procedure well with improvement in symptoms  Patient given exercises, stretches and lifestyle modifications  See medications in patient instructions if given  Patient will follow up in 4-8 weeks 

## 2018-11-05 NOTE — Assessment & Plan Note (Signed)
Patient continues to have pain that seems to be out of proportion.  The patient's right elbow does have the common extensor tear and we will consider PRP.  Started on Cymbalta though for the nerve pain.  Discussed which activities to do which wants to avoid.  Patient will increase activity as tolerated.  Discussed icing regimen, discussed avoiding certain activities.  Follow-up again in 4 to 8 weeks
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
## 2018-11-13 ENCOUNTER — Encounter: Payer: Self-pay | Admitting: Family Medicine

## 2018-11-16 ENCOUNTER — Other Ambulatory Visit: Payer: Self-pay | Admitting: Internal Medicine

## 2018-11-26 ENCOUNTER — Ambulatory Visit: Payer: Self-pay

## 2018-11-26 ENCOUNTER — Ambulatory Visit: Payer: Self-pay | Admitting: Family Medicine

## 2018-11-26 ENCOUNTER — Encounter: Payer: Self-pay | Admitting: Family Medicine

## 2018-11-26 ENCOUNTER — Other Ambulatory Visit: Payer: Self-pay

## 2018-11-26 VITALS — Ht 69.0 in

## 2018-11-26 DIAGNOSIS — M7711 Lateral epicondylitis, right elbow: Secondary | ICD-10-CM

## 2018-11-26 DIAGNOSIS — M25521 Pain in right elbow: Secondary | ICD-10-CM
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
## 2018-11-26 NOTE — Assessment & Plan Note (Signed)
Patient given PRP injection.  Discussed icing regimen and home exercises, discussed which activities to do which wants to avoid.  We discussed avoiding the icing for the next 76 hours.  Follow-up with me again after 3 weeks

## 2018-11-26 NOTE — Patient Instructions (Signed)
Good to see you See me at the end of the month

## 2018-12-03 ENCOUNTER — Encounter: Payer: Self-pay | Admitting: Family Medicine

## 2018-12-03 ENCOUNTER — Other Ambulatory Visit: Payer: Self-pay

## 2018-12-03 MED ORDER — DULOXETINE HCL 20 MG PO CPEP: 20.0000 mg | ORAL_CAPSULE | Freq: Every day | ORAL | 0 refills | Status: DC
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
## 2018-12-10 ENCOUNTER — Other Ambulatory Visit: Payer: Self-pay

## 2018-12-10 ENCOUNTER — Encounter: Payer: Self-pay | Admitting: Family Medicine

## 2018-12-10 ENCOUNTER — Ambulatory Visit: Payer: 59 | Admitting: Family Medicine

## 2018-12-10 VITALS — BP 126/80 | HR 73 | Ht 69.0 in | Wt 214.0 lb

## 2018-12-10 DIAGNOSIS — M999 Biomechanical lesion, unspecified: Secondary | ICD-10-CM | POA: Diagnosis not present

## 2018-12-10 MED ORDER — DULOXETINE HCL 20 MG PO CPEP: 20.0000 mg | ORAL_CAPSULE | Freq: Two times a day (BID) | ORAL | 3 refills | Status: DC

## 2018-12-10 NOTE — Assessment & Plan Note (Signed)
Looking good already.  Scar tissue formation noted.  Discussed home exercises and icing regimen, increase Cymbalta to 40 mg.  Discussed which activities to do which wants to avoid.  Follow-up again in 4 to 8 weeks.

## 2018-12-10 NOTE — Patient Instructions (Addendum)
20 mg tabs take twice a day See me again in 3-4 weeks

## 2018-12-10 NOTE — Assessment & Plan Note (Signed)
Decision today to treat with OMT was based on Physical Exam  After verbal consent patient was treated with HVLA, ME, FPR techniques in  thoracic, lumbar and sacral areas  Patient tolerated the procedure well with improvement in symptoms  Patient given exercises, stretches and lifestyle modifications  See medications in patient instructions if given  Patient will follow up in 4-8 weeks 

## 2018-12-24 ENCOUNTER — Encounter: Payer: Self-pay | Admitting: Family Medicine

## 2018-12-24 ENCOUNTER — Other Ambulatory Visit: Payer: Self-pay | Admitting: Internal Medicine

## 2018-12-24 NOTE — Telephone Encounter (Signed)
Refilled: 05/29/2018 Last OV: 08/10/2018 Next OV: not scheduled

## 2018-12-25 ENCOUNTER — Other Ambulatory Visit: Payer: Self-pay

## 2018-12-25 MED ORDER — PREDNISONE 50 MG PO TABS: ORAL_TABLET | ORAL | 0 refills | Status: DC

## 2018-12-28 ENCOUNTER — Encounter: Payer: Self-pay | Admitting: Family Medicine

## 2018-12-31 ENCOUNTER — Other Ambulatory Visit: Payer: Self-pay

## 2019-01-01 ENCOUNTER — Telehealth: Payer: Self-pay

## 2019-01-01 NOTE — Telephone Encounter (Signed)
Pt called requesting copy of last physical and last labs for her new job. They have been printed off and placed up front for pick up.
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
## 2019-01-06 NOTE — Assessment & Plan Note (Signed)
Decision today to treat with OMT was based on Physical Exam  After verbal consent patient was treated with HVLA, ME, FPR techniques in , thoracic, lumbar and sacral areas  Patient tolerated the procedure well with improvement in symptoms  Patient given exercises, stretches and lifestyle modifications  See medications in patient instructions if given  Patient will follow up in 4 to 8 weeks

## 2019-01-06 NOTE — Assessment & Plan Note (Signed)
Degenerative disc disease that is mild to moderate.  Patient feels different medications and seems to be doing relatively well.  Cymbalta seems to be helping.  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  Discussed which activities to do which wants to avoid.  Follow-up again in 4 to 6 weeks

## 2019-01-07 ENCOUNTER — Other Ambulatory Visit: Payer: Self-pay | Admitting: Internal Medicine

## 2019-01-07 ENCOUNTER — Other Ambulatory Visit: Payer: Self-pay

## 2019-01-07 ENCOUNTER — Ambulatory Visit: Payer: Self-pay | Admitting: Family Medicine

## 2019-01-07 ENCOUNTER — Encounter: Payer: Self-pay | Admitting: Family Medicine

## 2019-01-07 VITALS — BP 110/86 | HR 95 | Ht 69.0 in | Wt 222.0 lb

## 2019-01-07 DIAGNOSIS — M999 Biomechanical lesion, unspecified: Secondary | ICD-10-CM

## 2019-01-07 DIAGNOSIS — M5136 Other intervertebral disc degeneration, lumbar region: Secondary | ICD-10-CM

## 2019-01-07 MED ORDER — VITAMIN D (ERGOCALCIFEROL) 1.25 MG (50000 UNIT) PO CAPS: 50000.0000 [IU] | ORAL_CAPSULE | ORAL | 0 refills | Status: DC

## 2019-01-07 MED ORDER — CYCLOBENZAPRINE HCL 10 MG PO TABS: 10.0000 mg | ORAL_TABLET | Freq: Three times a day (TID) | ORAL | 2 refills | Status: DC | PRN

## 2019-01-07 NOTE — Patient Instructions (Addendum)
Great to see you  Travel safe.  Nexium 2x a day for 2 weeks See me again in 3 months

## 2019-01-08 ENCOUNTER — Encounter: Payer: Self-pay | Admitting: Family Medicine

## 2019-01-08 ENCOUNTER — Ambulatory Visit: Payer: 59 | Admitting: Family Medicine

## 2019-01-22 ENCOUNTER — Ambulatory Visit: Payer: 59 | Admitting: Family Medicine

## 2019-03-19 ENCOUNTER — Other Ambulatory Visit: Payer: Self-pay | Admitting: Family Medicine

## 2019-03-19 ENCOUNTER — Other Ambulatory Visit: Payer: Self-pay

## 2019-03-19 MED ORDER — LOSARTAN POTASSIUM 100 MG PO TABS: 100.0000 mg | ORAL_TABLET | Freq: Every day | ORAL | 0 refills | Status: DC

## 2019-03-19 MED ORDER — VITAMIN D (ERGOCALCIFEROL) 1.25 MG (50000 UNIT) PO CAPS: 50000.0000 [IU] | ORAL_CAPSULE | ORAL | 0 refills | Status: DC

## 2019-03-27 ENCOUNTER — Ambulatory Visit (INDEPENDENT_AMBULATORY_CARE_PROVIDER_SITE_OTHER): Payer: Self-pay | Admitting: Family Medicine

## 2019-03-27 ENCOUNTER — Encounter: Payer: Self-pay | Admitting: Family Medicine

## 2019-03-27 ENCOUNTER — Other Ambulatory Visit: Payer: Self-pay

## 2019-03-27 VITALS — BP 110/84 | HR 83 | Ht 69.0 in | Wt 222.0 lb

## 2019-03-27 DIAGNOSIS — M999 Biomechanical lesion, unspecified: Secondary | ICD-10-CM

## 2019-03-27 DIAGNOSIS — M5136 Other intervertebral disc degeneration, lumbar region: Secondary | ICD-10-CM

## 2019-03-27 MED ORDER — PREDNISONE 50 MG PO TABS: ORAL_TABLET | ORAL | 0 refills | Status: DC

## 2019-03-27 NOTE — Assessment & Plan Note (Signed)
Decision today to treat with OMT was based on Physical Exam  After verbal consent patient was treated with HVLA, ME, FPR techniques in  thoracic, lumbar and sacral areas  Patient tolerated the procedure well with improvement in symptoms  Patient given exercises, stretches and lifestyle modifications  See medications in patient instructions if given  Patient will follow up in 4-8 weeks 
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
## 2019-03-27 NOTE — Patient Instructions (Signed)
See me in 3-4 weeks Prednisone 5 days Baby Aspirin daily for 6 weeks

## 2019-03-27 NOTE — Assessment & Plan Note (Signed)
Degenerative disc disease.  Chronic problem, mild exacerbation, I do think some of some of this seems to be secondary to patient's cough that was secondary to Covid.  Likely contributing to some of the inflammation.  Discussed icing regimen and home exercise, which activities to potentially avoid.  Patient will increase activity slowly.  Follow-up again in 4 to 8 weeks

## 2019-04-16 ENCOUNTER — Ambulatory Visit: Payer: Self-pay | Admitting: Family Medicine

## 2019-04-16 ENCOUNTER — Encounter: Payer: Self-pay | Admitting: Family Medicine

## 2019-04-16 ENCOUNTER — Ambulatory Visit (INDEPENDENT_AMBULATORY_CARE_PROVIDER_SITE_OTHER): Payer: Self-pay

## 2019-04-16 ENCOUNTER — Other Ambulatory Visit: Payer: Self-pay

## 2019-04-16 VITALS — BP 108/70 | HR 102 | Ht 69.0 in | Wt 228.0 lb

## 2019-04-16 DIAGNOSIS — M5136 Other intervertebral disc degeneration, lumbar region: Secondary | ICD-10-CM

## 2019-04-16 DIAGNOSIS — M545 Low back pain, unspecified: Secondary | ICD-10-CM

## 2019-04-16 DIAGNOSIS — G8929 Other chronic pain: Secondary | ICD-10-CM

## 2019-04-16 DIAGNOSIS — M999 Biomechanical lesion, unspecified: Secondary | ICD-10-CM

## 2019-04-16 MED ORDER — IBUPROFEN 800 MG PO TABS: 800.0000 mg | ORAL_TABLET | Freq: Three times a day (TID) | ORAL | 0 refills | Status: DC | PRN

## 2019-04-16 NOTE — Patient Instructions (Addendum)
Xray today See me again in 4-6 weeks
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
## 2019-04-16 NOTE — Assessment & Plan Note (Signed)
Known degenerative disc disease but patient is having worsening symptoms.  Discussed icing regimen and home exercise.  Patient does discontinue the significant number of her medications.  Would consider the possibility of advanced imaging but secondary to financial constraints with patient being unemployed at the moment we will hold.  We will get x-rays though to see if anything else is changed at the moment.  Discussed different medications and seeing patients IBS.  Follow-up with me again in 4 to 5 weeks.

## 2019-04-16 NOTE — Assessment & Plan Note (Signed)
   Decision today to treat with OMT was based on Physical Exam  After verbal consent patient was treated with HVLA, ME, FPR techniques in  thoracic, , lumbar and sacral areas, all areas are chronic   Patient tolerated the procedure well with improvement in symptoms  Patient given exercises, stretches and lifestyle modifications  See medications in patient instructions if given  Patient will follow up in 4-8 weeks

## 2019-05-24 ENCOUNTER — Ambulatory Visit: Payer: Self-pay | Admitting: Family Medicine

## 2019-05-24 ENCOUNTER — Other Ambulatory Visit: Payer: Self-pay

## 2019-05-24 ENCOUNTER — Ambulatory Visit: Payer: Self-pay

## 2019-05-24 ENCOUNTER — Encounter: Payer: Self-pay | Admitting: Family Medicine

## 2019-05-24 VITALS — BP 128/88 | HR 94 | Ht 69.0 in | Wt 220.0 lb

## 2019-05-24 DIAGNOSIS — S82899A Other fracture of unspecified lower leg, initial encounter for closed fracture: Secondary | ICD-10-CM

## 2019-05-24 DIAGNOSIS — M5412 Radiculopathy, cervical region: Secondary | ICD-10-CM

## 2019-05-24 DIAGNOSIS — M25572 Pain in left ankle and joints of left foot: Secondary | ICD-10-CM

## 2019-05-24 HISTORY — DX: Other fracture of unspecified lower leg, initial encounter for closed fracture: S82.899A

## 2019-05-24 MED ORDER — NABUMETONE 750 MG PO TABS
750.0000 mg | ORAL_TABLET | Freq: Every day | ORAL | 0 refills | Status: DC
Start: 2019-05-24 — End: 2019-08-01

## 2019-05-24 MED ORDER — METHYLPREDNISOLONE ACETATE 80 MG/ML IJ SUSP
80.0000 mg | Freq: Once | INTRAMUSCULAR | Status: AC
Start: 2019-05-24 — End: 2019-05-24
  Administered 2019-05-24: 80 mg via INTRAMUSCULAR

## 2019-05-24 MED ORDER — KETOROLAC TROMETHAMINE 60 MG/2ML IM SOLN
60.0000 mg | Freq: Once | INTRAMUSCULAR | Status: AC
  Administered 2019-05-24: 60 mg via INTRAMUSCULAR

## 2019-05-24 NOTE — Assessment & Plan Note (Signed)
Lateral avulsion of the ankle.  Discussed with patient in great length.  Patient does not have insurance at this moment.  Patient will try to get an Aircast over-the-counter, Toradol and Depo-Medrol given for some pain relief.  We discussed proper shoes.  Patient does have some abnormality noted of the fifth metatarsal that is difficult to see on the ultrasound and we did discuss the possibility of x-rays but we will hold secondary to financial constraints.  We discussed icing regimen and regions soled shoe.  Follow-up again in 3 to 4 weeks to ensure healing appropriately

## 2019-05-24 NOTE — Assessment & Plan Note (Signed)
Neck with mild radicular symptoms.  History of cervical surgical intervention.  Discussed icing regimen and home exercise, which activities to doing which wants to avoid.  Patient will increase activity slowly over the course the next several weeks.  Increase icing regimen and home exercises.  Worsening symptoms will need to discuss advanced imaging.  Once again financial concerns no are something we will need to continue to monitor.

## 2019-05-24 NOTE — Patient Instructions (Addendum)
Aircast  Relafen  See me again in 4-5 weeks
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
## 2019-06-23 ENCOUNTER — Other Ambulatory Visit: Payer: Self-pay | Admitting: Internal Medicine

## 2019-06-24 NOTE — Telephone Encounter (Signed)
Refill request for Patricia [name], last seen 08-09-18, last filled 12-25-18.  Please advise.

## 2019-06-26 ENCOUNTER — Ambulatory Visit: Payer: Self-pay | Admitting: Family Medicine

## 2019-08-01 ENCOUNTER — Ambulatory Visit (INDEPENDENT_AMBULATORY_CARE_PROVIDER_SITE_OTHER): Payer: Self-pay | Admitting: Family Medicine

## 2019-08-01 ENCOUNTER — Encounter: Payer: Self-pay | Admitting: Family Medicine

## 2019-08-01 ENCOUNTER — Other Ambulatory Visit: Payer: Self-pay

## 2019-08-01 DIAGNOSIS — M9971 Connective tissue and disc stenosis of intervertebral foramina of cervical region: Secondary | ICD-10-CM

## 2019-08-01 DIAGNOSIS — M5136 Other intervertebral disc degeneration, lumbar region: Secondary | ICD-10-CM

## 2019-08-01 DIAGNOSIS — M51369 Other intervertebral disc degeneration, lumbar region without mention of lumbar back pain or lower extremity pain: Secondary | ICD-10-CM

## 2019-08-01 DIAGNOSIS — M4802 Spinal stenosis, cervical region: Secondary | ICD-10-CM

## 2019-08-01 DIAGNOSIS — M999 Biomechanical lesion, unspecified: Secondary | ICD-10-CM

## 2019-08-01 MED ORDER — VITAMIN D (ERGOCALCIFEROL) 1.25 MG (50000 UNIT) PO CAPS
50000.0000 [IU] | ORAL_CAPSULE | ORAL | 0 refills | Status: DC
Start: 2019-08-01 — End: 2019-08-16

## 2019-08-01 MED ORDER — NAPROXEN 500 MG PO TABS
500.0000 mg | ORAL_TABLET | Freq: Two times a day (BID) | ORAL | 2 refills | Status: DC
Start: 2019-08-01 — End: 2020-01-20

## 2019-08-01 NOTE — Patient Instructions (Signed)
Naproxen BID 500 mg Take a PPI Calm magnesium  Keep up with exercises See me again in 6 weeks
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
## 2019-08-02 ENCOUNTER — Other Ambulatory Visit: Payer: Self-pay | Admitting: Internal Medicine

## 2019-08-02 ENCOUNTER — Encounter: Payer: Self-pay | Admitting: Family Medicine

## 2019-08-02 NOTE — Assessment & Plan Note (Signed)
Stable overall.  Does respond to osteopathic manipulation.  We did refill naproxen and I told her to take a PPI to help protect stomach.  Patient will continue to try to do the exercises.  Encouraged her to continue to increase activity.  Follow-up again in 4 to 8 weeks

## 2019-08-02 NOTE — Assessment & Plan Note (Signed)
Status post surgery but doing relatively well.

## 2019-08-16 ENCOUNTER — Other Ambulatory Visit: Payer: Self-pay

## 2019-08-16 ENCOUNTER — Ambulatory Visit (INDEPENDENT_AMBULATORY_CARE_PROVIDER_SITE_OTHER): Payer: Self-pay | Admitting: Internal Medicine

## 2019-08-16 ENCOUNTER — Encounter: Payer: Self-pay | Admitting: Internal Medicine

## 2019-08-16 VITALS — BP 132/90 | HR 81 | Temp 98.3°F | Resp 15 | Ht 69.0 in | Wt 227.6 lb

## 2019-08-16 DIAGNOSIS — E079 Disorder of thyroid, unspecified: Secondary | ICD-10-CM

## 2019-08-16 DIAGNOSIS — F411 Generalized anxiety disorder: Secondary | ICD-10-CM

## 2019-08-16 DIAGNOSIS — Z0000 Encounter for general adult medical examination without abnormal findings: Secondary | ICD-10-CM

## 2019-08-16 DIAGNOSIS — R232 Flushing: Secondary | ICD-10-CM

## 2019-08-16 DIAGNOSIS — F430 Acute stress reaction: Secondary | ICD-10-CM

## 2019-08-16 DIAGNOSIS — F5102 Adjustment insomnia: Secondary | ICD-10-CM

## 2019-08-16 DIAGNOSIS — E119 Type 2 diabetes mellitus without complications: Secondary | ICD-10-CM

## 2019-08-16 DIAGNOSIS — I10 Essential (primary) hypertension: Secondary | ICD-10-CM

## 2019-08-16 LAB — COMPREHENSIVE METABOLIC PANEL
ALT: 14 U/L (ref 0–35)
AST: 15 U/L (ref 0–37)
Albumin: 3.8 g/dL (ref 3.5–5.2)
Alkaline Phosphatase: 63 U/L (ref 39–117)
BUN: 9 mg/dL (ref 6–23)
CO2: 27 mEq/L (ref 19–32)
Calcium: 9.3 mg/dL (ref 8.4–10.5)
Chloride: 100 mEq/L (ref 96–112)
Creatinine, Ser: 0.69 mg/dL (ref 0.40–1.20)
GFR: 90.13 mL/min (ref >60.00)
Glucose, Bld: 111 mg/dL — ABNORMAL HIGH (ref 70–99)
Potassium: 4.4 mEq/L (ref 3.5–5.1)
Sodium: 135 mEq/L (ref 135–145)
Total Bilirubin: 0.5 mg/dL (ref 0.2–1.2)
Total Protein: 7 g/dL (ref 6.0–8.3)

## 2019-08-16 LAB — MICROALBUMIN / CREATININE URINE RATIO
Creatinine,U: 37.3 mg/dL
Microalb Creat Ratio: 1.9 mg/g (ref 0.0–30.0)
Microalb, Ur: <0.7 mg/dL (ref 0.0–1.9)

## 2019-08-16 LAB — FOLLICLE STIMULATING HORMONE: FSH: 4.6 m[IU]/mL

## 2019-08-16 LAB — TSH: TSH: 1.79 u[IU]/mL (ref 0.35–4.50)

## 2019-08-16 LAB — HEMOGLOBIN A1C: Hgb A1c MFr Bld: 6.6 % — ABNORMAL HIGH (ref 4.6–6.5)

## 2019-08-16 MED ORDER — ZOLPIDEM TARTRATE 10 MG PO TABS: ORAL_TABLET | ORAL | 1 refills | Status: DC

## 2019-08-16 MED ORDER — VENLAFAXINE HCL 37.5 MG PO TABS: 37.5000 mg | ORAL_TABLET | Freq: Two times a day (BID) | ORAL | 2 refills | Status: DC

## 2019-08-16 NOTE — Patient Instructions (Signed)
Today we are adding Effexor 37.5 mg twice daily.  Increase dose to 75 mg after 2 weeks if tolerating it without side effects   Continue ambien  And mirapex for now since you are adding CALM and Effexor    336  [phone] my cell phone.  We can follow up in 2-3 weeks by cell phone over a weekend or weeknight
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
## 2019-08-19 DIAGNOSIS — F411 Generalized anxiety disorder: Secondary | ICD-10-CM | POA: Insufficient documentation

## 2019-08-19 NOTE — Assessment & Plan Note (Signed)
Well controlled on current regimen of losartan. Renal function stable, no changes today.  Lab Results  Component Value Date   CREATININE 0.69 08/16/2019   Lab Results  Component Value Date   NA 135 08/16/2019   K 4.4 08/16/2019   CL 100 08/16/2019   CO2 27 08/16/2019

## 2019-08-19 NOTE — Assessment & Plan Note (Signed)
Starting effexor at 37.5 mg daily   Follow up in 2 week s

## 2019-08-19 NOTE — Assessment & Plan Note (Signed)
Refilling ambien.  The risks and benefits of hypnotics  use were discussed with patient today including excessive sedation leading to respiratory depression,  impaired thinking/driving, and addiction.  Patient was advised to avoid concurrent use with alcohol, to use medication only as needed and not to share with others  .

## 2019-08-19 NOTE — Assessment & Plan Note (Addendum)
remains well-controlled on diet alone .  hemoglobin A1c has been consistently at or  less than 7.0 . Patient is up-to-date on eye exams and foot exam is normal today. Patient has no microalbuminuria. Patient is on ACE/ARB for renal protection and hypertension   Lab Results  Component Value Date   HGBA1C 6.6 (H) 08/16/2019   Lab Results  Component Value Date   MICROALBUR <0.7 08/16/2019   .lastlipid

## 2019-08-19 NOTE — Assessment & Plan Note (Signed)

## 2019-08-23 MED ORDER — METOPROLOL TARTRATE 25 MG PO TABS: ORAL_TABLET | ORAL | 2 refills | Status: DC

## 2019-08-27 ENCOUNTER — Other Ambulatory Visit: Payer: Self-pay | Admitting: Internal Medicine

## 2019-09-10 ENCOUNTER — Ambulatory Visit: Payer: Self-pay | Admitting: Family Medicine

## 2019-10-07 ENCOUNTER — Ambulatory Visit: Payer: Self-pay | Admitting: Internal Medicine

## 2019-10-08 ENCOUNTER — Other Ambulatory Visit: Payer: Self-pay | Admitting: Internal Medicine

## 2019-10-09 ENCOUNTER — Other Ambulatory Visit: Payer: Self-pay

## 2019-10-09 ENCOUNTER — Encounter: Payer: Self-pay | Admitting: Internal Medicine

## 2019-10-09 ENCOUNTER — Ambulatory Visit (INDEPENDENT_AMBULATORY_CARE_PROVIDER_SITE_OTHER): Payer: 59 | Admitting: Internal Medicine

## 2019-10-09 VITALS — BP 106/84 | HR 85 | Temp 98.5°F | Resp 15 | Ht 69.0 in | Wt 223.4 lb

## 2019-10-09 DIAGNOSIS — F5102 Adjustment insomnia: Secondary | ICD-10-CM | POA: Diagnosis not present

## 2019-10-09 DIAGNOSIS — I499 Cardiac arrhythmia, unspecified: Secondary | ICD-10-CM | POA: Diagnosis not present

## 2019-10-09 DIAGNOSIS — G2579 Other drug induced movement disorders: Secondary | ICD-10-CM

## 2019-10-09 DIAGNOSIS — Z23 Encounter for immunization: Secondary | ICD-10-CM

## 2019-10-09 DIAGNOSIS — I471 Supraventricular tachycardia: Secondary | ICD-10-CM

## 2019-10-09 DIAGNOSIS — I10 Essential (primary) hypertension: Secondary | ICD-10-CM

## 2019-10-09 MED ORDER — ALPRAZOLAM 0.5 MG PO TABS: 0.5000 mg | ORAL_TABLET | Freq: Every evening | ORAL | 3 refills | Status: DC | PRN

## 2019-10-09 MED ORDER — LOSARTAN POTASSIUM 100 MG PO TABS
50.0000 mg | ORAL_TABLET | Freq: Every day | ORAL | 0 refills | Status: DC
Start: 2019-10-09 — End: 2019-10-10

## 2019-10-09 NOTE — Progress Notes (Signed)
Subjective:  Patient ID: Patricia [name], female    DOB: 1970/08/10  Age: 49 y.o. MRN: [id number]  CC: The primary encounter diagnosis was Irregular heart rate. Diagnoses of Need for immunization against influenza, Serotonin syndrome, Insomnia due to psychological stress, PAT (paroxysmal atrial tachycardia) ([place]), and Essential hypertension were also pertinent to this visit.  HPI Patricia [name] presents for follow up on self diagnosed serotonin syndrome  This visit occurred during the SARS-CoV-2 public health emergency.  Safety protocols were in place, including screening questions prior to the visit, additional usage of staff PPE, and extensive cleaning of exam room while observing appropriate contact time as indicated for disinfecting solutions.   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   Symptoms of depression improved  But she developed dizziness,  Irregular heart rate with rates  to 160 despite use of prn metoprolol , and episodes of  chest pressure which radiated to between shoulder blades and would last several hours, brought on with activity.   Most  Symptoms resolved after stopping the Effexor  ,  but still having palpitations and heart rate to 160 followed by drop to 50  , occurring  with minimal activity,  .  Occurring 2-3 times daily ,  mostly in the evening,  Lasts for several hours  Triggered by stress . Still only using lopressor prn .  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 Find the work rewarding , not stressful   Father died  died 2  days ago at 76 of CHF  History of rheumatic fever  Aortic valve replacement . Alcohol and tobacco    Outpatient Medications Prior to Visit  Medication Sig Dispense Refill  . cyclobenzaprine (FLEXERIL) 10 MG tablet Take 1 tablet (10 mg total) by mouth 3 (three) times daily as needed for muscle spasms. 90 tablet 2  . fluticasone (FLONASE) 50 MCG/ACT nasal spray Place 2 sprays into both nostrils daily. 16 g 6  . ibuprofen (ADVIL) 800 MG tablet Take 1 tablet (800 mg total) by mouth every 8 (eight) hours as needed. 60 tablet 0  . loratadine (CLARITIN) 10 MG tablet Take 1 tablet (10 mg total) by mouth daily. 90 tablet 2  . metoprolol tartrate (LOPRESSOR) 25 MG tablet Take 1/2 tablet (12.5 mg) to one tablet (25 mg) twice daily as needed for palpitations 60 tablet 2  . montelukast (SINGULAIR) 10 MG tablet TAKE ONE TABLET AT BEDTIME 90 tablet 3  . naproxen (NAPROSYN) 500 MG tablet Take 1 tablet (500 mg total) by mouth 2 (two) times daily with a meal. 60 tablet 2  . pramipexole (MIRAPEX) 0.75 MG tablet TAKE ONE TABLET 3 TIMES DAILY 90 tablet 2  . Vitamin D, Ergocalciferol, (DRISDOL) 1.25 MG (50000 UNIT) CAPS capsule Take 1 capsule (50,000 Units total) by mouth every 7 (seven) days. 12 capsule 0  . zolpidem (AMBIEN) 10 MG tablet TAKE ONE TABLET AT BEDTIME AS NEEDED FOR SLEEP 30 tablet 1  . losartan (COZAAR) 100 MG tablet TAKE ONE TABLET EVERY DAY CALL OFFICE FOR 6 MONTH FOLLW UP 90 tablet 0  .  venlafaxine (EFFEXOR) 37.5 MG tablet Take 1 tablet (37.5 mg total) by mouth 2 (two) times daily. (Patient not taking: Reported on 10/09/2019) 60 tablet 2   No facility-administered medications prior to visit.    Review of Systems;  Patient denies headache, fevers, malaise, unintentional weight loss, skin rash, eye pain, sinus congestion and sinus pain, sore throat, dysphagia,  hemoptysis , cough, dyspnea, wheezing,, orthopnea, edema, abdominal pain, nausea, melena, diarrhea, constipation, flank pain, dysuria,  hematuria, urinary  Frequency, nocturia, numbness, tingling, seizures,  Focal weakness, Loss of consciousness,  Tremor, insomnia, depression, anxiety, and suicidal ideation.      Objective:  BP 106/84 (BP Location: Left Arm, Patient Position: Sitting, Cuff Size: Large)   Pulse 85   Temp 98.5 F (36.9 C) (Oral)   Resp 15   Ht 5\' 9"  (1.753 m)   Wt 223 lb 6.4 oz (101.3 kg)   SpO2 98%   BMI 32.99 kg/m   BP Readings from Last 3 Encounters:  10/09/19 106/84  08/16/19 132/90  08/01/19 (!) 120/90    Wt Readings from Last 3 Encounters:  10/09/19 223 lb 6.4 oz (101.3 kg)  08/16/19 227 lb 9.6 oz (103.2 kg)  08/01/19 (!) 229 lb (103.9 kg)    General appearance: alert, cooperative and appears stated age Ears: normal TM's and external ear canals both ears Throat: lips, mucosa, and tongue normal; teeth and gums normal Neck: no adenopathy, no carotid bruit, supple, symmetrical, trachea midline and thyroid not enlarged, symmetric, no tenderness/mass/nodules Back: symmetric, no curvature. ROM normal. No CVA tenderness. Lungs: clear to auscultation bilaterally Heart: regular rate and rhythm, S1, S2 normal, no murmur, click, rub or gallop Abdomen: soft, non-tender; bowel sounds normal; no masses,  no organomegaly Pulses: 2+ and symmetric Skin: Skin color, texture, turgor normal. No rashes or lesions Lymph nodes: Cervical, supraclavicular, and axillary nodes normal.  Lab Results  Component Value Date   HGBA1C 6.6 (H) 08/16/2019   HGBA1C 6.5 07/03/2018   HGBA1C 6.3 06/07/2017    Lab Results  Component Value Date   CREATININE 0.71 10/09/2019   CREATININE 0.69 08/16/2019   CREATININE 0.70 07/03/2018    Lab Results  Component Value Date   WBC 9.4 10/09/2019   HGB 12.3 10/09/2019   HCT 37.1 10/09/2019   PLT 315.0 10/09/2019   GLUCOSE 121 (H) 10/09/2019   CHOL 200 07/03/2018   TRIG 146.0 07/03/2018   HDL 44.30 07/03/2018   LDLDIRECT 116.0 06/07/2017   LDLCALC 126 (H) 07/03/2018     ALT 16 10/09/2019   AST 16 10/09/2019   NA 137 10/09/2019   K 3.8 10/09/2019   CL 102 10/09/2019   CREATININE 0.71 10/09/2019   BUN 9 10/09/2019   CO2 27 10/09/2019   TSH 1.19 10/09/2019   HGBA1C 6.6 (H) 08/16/2019   MICROALBUR <0.7 08/16/2019    MM 3D SCREEN BREAST BILATERAL  Result Date: 08/09/2018 CLINICAL DATA:  Screening. EXAM: DIGITAL SCREENING BILATERAL MAMMOGRAM WITH TOMO AND CAD COMPARISON:  Previous exam(s). ACR Breast Density Category c: The breast tissue is heterogeneously dense, which may obscure small masses. FINDINGS: There are no findings suspicious for malignancy. Images were processed with CAD. IMPRESSION: No mammographic evidence of malignancy. A result letter of this screening mammogram will be mailed directly to the patient. RECOMMENDATION: Screening mammogram in one year. (Code:[id number]-B-01Y) BI-RADS CATEGORY  1: Negative. Electronically Signed   By: Fidela Salisbury M.D.   On: 08/09/2018 14:45    Assessment & Plan:   Problem List Items  Addressed This Visit      Unprioritized   Essential hypertension    resume 50 mg losartan for sbp > 150       Relevant Medications   losartan (COZAAR) 50 MG tablet   Insomnia due to psychological stress    Low dose alprazolam prescribed for prn use The risks and benefits of benzodiazepine use were discussed with patient today including excessive sedation leading to respiratory depression,  impaired thinking/driving, and addiction.  Patient was advised to avoid concurrent use with alcohol, to use medication only as needed and not to share with others  .        PAT (paroxysmal atrial tachycardia) (H[facility])    Screening labs normal.   I have ordered and reviewed a 12 lead EKG and find that there are no acute changes and patient is in sinus rhythm.   Scheduled metoprolol 25 mg bid.   Lab Results  Component Value Date   TSH 1.19 10/09/2019   Lab Results  Component Value Date   NA 137 10/09/2019   K 3.8 10/09/2019   CL 102  10/09/2019   CO2 27 10/09/2019         Relevant Medications   losartan (COZAAR) 50 MG tablet   Serotonin syndrome    Secondary to venlafaxine.   Muscle spasms have resolved but episodes of tachycardia have continued.   Medication discontinued        Other Visit Diagnoses    Irregular heart rate    -  Primary   Relevant Orders   EKG 12-Lead (Completed)   TSH (Completed)   CBC with Differential/Platelet (Completed)   Comprehensive metabolic panel (Completed)   Magnesium   Need for immunization against influenza       Relevant Orders   Flu Vaccine QUAD 36+ mos IM (Completed)      I have discontinued Jaide N. Mignogna's venlafaxine, losartan, and losartan. I am also having her start on losartan. Additionally, I am having her maintain her loratadine, fluticasone, cyclobenzaprine, Vitamin D (Ergocalciferol), ibuprofen, pramipexole, naproxen, zolpidem, metoprolol tartrate, montelukast, and ALPRAZolam.  Meds ordered this encounter  Medications  . DISCONTD: losartan (COZAAR) 100 MG tablet    Sig: Take 0.5 tablets (50 mg total) by mouth daily.    Dispense:  90 tablet    Refill:  0    PT REQUEST REFILLS PLEASE  . DISCONTD: ALPRAZolam (XANAX) 0.5 MG tablet    Sig: Take 1 tablet (0.5 mg total) by mouth at bedtime as needed for anxiety.    Dispense:  30 tablet    Refill:  3  . ALPRAZolam (XANAX) 0.5 MG tablet    Sig: Take 1 tablet (0.5 mg total) by mouth at bedtime as needed for anxiety.    Dispense:  30 tablet    Refill:  3  . losartan (COZAAR) 50 MG tablet    Sig: Take 1 tablet (50 mg total) by mouth at bedtime.    Dispense:  90 tablet    Refill:  0    Replaces previous one for 100 mg 0.5 tablet daily    Medications Discontinued During This Encounter  Medication Reason  . losartan (COZAAR) 100 MG tablet   . venlafaxine (EFFEXOR) 37.5 MG tablet Allergic reaction  . ALPRAZolam (XANAX) 0.5 MG tablet Reorder  . losartan (COZAAR) 100 MG tablet     Follow-up: No follow-ups  on file.   [name], MD

## 2019-10-09 NOTE — Patient Instructions (Addendum)
Start taking metoprolol  25 mg every 12 hours .  Increase to 50 mg if you continue to have episodes of tachycardia  Do not resume losartan until you have been on the scheduled metoprolol for 3  Days and only if  BP is > 140  Alprazolam for insomnia

## 2019-10-10 ENCOUNTER — Other Ambulatory Visit: Payer: 59

## 2019-10-10 DIAGNOSIS — G9081 Serotonin syndrome: Secondary | ICD-10-CM | POA: Insufficient documentation

## 2019-10-10 DIAGNOSIS — I499 Cardiac arrhythmia, unspecified: Secondary | ICD-10-CM

## 2019-10-10 DIAGNOSIS — G2579 Other drug induced movement disorders: Secondary | ICD-10-CM | POA: Insufficient documentation

## 2019-10-10 DIAGNOSIS — I471 Supraventricular tachycardia: Secondary | ICD-10-CM | POA: Insufficient documentation

## 2019-10-10 LAB — CBC WITH DIFFERENTIAL/PLATELET
Basophils Absolute: 0.1 10*3/uL (ref 0.0–0.1)
Basophils Relative: 0.6 % (ref 0.0–3.0)
Eosinophils Absolute: 0.2 10*3/uL (ref 0.0–0.7)
Eosinophils Relative: 2.2 % (ref 0.0–5.0)
HCT: 37.1 % (ref 36.0–46.0)
Hemoglobin: 12.3 g/dL (ref 12.0–15.0)
Lymphocytes Relative: 33.4 % (ref 12.0–46.0)
Lymphs Abs: 3.1 10*3/uL (ref 0.7–4.0)
MCHC: 33 g/dL (ref 30.0–36.0)
MCV: 84.7 fl (ref 78.0–100.0)
Monocytes Absolute: 0.7 10*3/uL (ref 0.1–1.0)
Monocytes Relative: 7.6 % (ref 3.0–12.0)
Neutro Abs: 5.3 10*3/uL (ref 1.4–7.7)
Neutrophils Relative %: 56.2 % (ref 43.0–77.0)
Platelets: 315 10*3/uL (ref 150.0–400.0)
RBC: 4.38 Mil/uL (ref 3.87–5.11)
RDW: 14.6 % (ref 11.5–15.5)
WBC: 9.4 10*3/uL (ref 4.0–10.5)

## 2019-10-10 LAB — COMPREHENSIVE METABOLIC PANEL
ALT: 16 U/L (ref 0–35)
AST: 16 U/L (ref 0–37)
Albumin: 3.8 g/dL (ref 3.5–5.2)
Alkaline Phosphatase: 58 U/L (ref 39–117)
BUN: 9 mg/dL (ref 6–23)
CO2: 27 mEq/L (ref 19–32)
Calcium: 9 mg/dL (ref 8.4–10.5)
Chloride: 102 mEq/L (ref 96–112)
Creatinine, Ser: 0.71 mg/dL (ref 0.40–1.20)
GFR: 87.16 mL/min (ref >60.00)
Glucose, Bld: 121 mg/dL — ABNORMAL HIGH (ref 70–99)
Potassium: 3.8 mEq/L (ref 3.5–5.1)
Sodium: 137 mEq/L (ref 135–145)
Total Bilirubin: 0.4 mg/dL (ref 0.2–1.2)
Total Protein: 6.8 g/dL (ref 6.0–8.3)

## 2019-10-10 LAB — TSH: TSH: 1.19 u[IU]/mL (ref 0.35–4.50)

## 2019-10-10 MED ORDER — LOSARTAN POTASSIUM 50 MG PO TABS: 50.0000 mg | ORAL_TABLET | Freq: Every day | ORAL | 0 refills | Status: DC

## 2019-10-10 NOTE — Assessment & Plan Note (Signed)
resume 50 mg losartan for sbp > 150

## 2019-10-10 NOTE — Assessment & Plan Note (Signed)
Low dose alprazolam prescribed for prn use . The risks and benefits of benzodiazepine use were discussed with patient today including excessive sedation leading to respiratory depression,  impaired thinking/driving, and addiction.  Patient was advised to avoid concurrent use with alcohol, to use medication only as needed and not to share with others  .  

## 2019-10-10 NOTE — Assessment & Plan Note (Addendum)
Secondary to venlafaxine.   Muscle spasms have resolved but episodes of tachycardia have continued.   Medication discontinued

## 2019-10-10 NOTE — Assessment & Plan Note (Signed)
Screening labs normal.   I have ordered and reviewed a 12 lead EKG and find that there are no acute changes and patient is in sinus rhythm.   Scheduled metoprolol 25 mg bid.   Lab Results  Component Value Date   TSH 1.19 10/09/2019   Lab Results  Component Value Date   NA 137 10/09/2019   K 3.8 10/09/2019   CL 102 10/09/2019   CO2 27 10/09/2019

## 2019-10-11 LAB — MAGNESIUM: Magnesium: 1.8 mg/dL (ref 1.5–2.5)

## 2019-10-11 NOTE — Progress Notes (Signed)
Your labs are all normal  Regards,   Deborra Medina, MD

## 2019-10-22 ENCOUNTER — Telehealth: Payer: Self-pay

## 2019-10-22 DIAGNOSIS — I471 Supraventricular tachycardia: Secondary | ICD-10-CM

## 2019-10-22 NOTE — Telephone Encounter (Signed)
Pt said she made an appointment with Dr. Fletcher Anon for 11/15/19. She said that Dr. Tyrell Antonio office would like for Dr. Derrel Nip to order a holter monitor for patient so she can have for them to check on 11/15/19 when she has her appointment.   Pt is back and forth between [place] and [place] so she really needs this order placed.   She would like a call back.

## 2019-10-22 NOTE — Telephone Encounter (Signed)
Order for 48 hr Holter monitor has been placed through [facility] in [place]  .  Please let patient know where to go to pick it up

## 2019-10-23 NOTE — Telephone Encounter (Signed)
Thank you for making it a priority  Dr Fletcher Anon wants the results before he sees her in November for her scheduled referral appt

## 2019-10-23 NOTE — Telephone Encounter (Signed)
The order was placed the only way I know how..  Can you check to see if it is ordered correctly? it is in the chart and I see it under CV procedures. I don't have a "medical equipment"  Tab or heading.

## 2019-10-23 NOTE — Telephone Encounter (Signed)
Good morning!  Just confirming. Was there a order placed for the holter monitor through medical equipment?   Per pt She said that Dr. Tyrell Antonio office would like for Dr. Derrel Nip to order a holter monitor. Please advise and Thank you!

## 2019-10-23 NOTE — Telephone Encounter (Signed)
Ok its under scheduled order. I see it now. I'm on September orders. No problem will work on it. Thank you!

## 2019-10-24 NOTE — Telephone Encounter (Signed)
Good morning!  No problem! I called pt to get her scheduled she states she's out of town I gave pt the number to call and schedule.

## 2019-10-25 ENCOUNTER — Ambulatory Visit: Payer: 59

## 2019-10-30 ENCOUNTER — Other Ambulatory Visit: Payer: Self-pay

## 2019-10-30 ENCOUNTER — Ambulatory Visit
Admission: RE | Admit: 2019-10-30 | Discharge: 2019-10-30 | Disposition: A | Discharge status: Home / Self Care | Payer: 59 | Source: Ambulatory Visit | Attending: Internal Medicine | Admitting: Internal Medicine

## 2019-10-30 DIAGNOSIS — I471 Supraventricular tachycardia: Secondary | ICD-10-CM | POA: Insufficient documentation

## 2019-11-08 ENCOUNTER — Other Ambulatory Visit: Payer: Self-pay | Admitting: Internal Medicine

## 2019-11-14 NOTE — Progress Notes (Signed)
Sefora,  I received your holter monitor results..  dr Fletcher Anon noted some runs of atrial flutter,  so he wants you to see an EP specialist.  Since you are seeing him tomorrow,  he will handle that .  If for some reason you are unable to keep that appt , please let me know so I can make the referral .  Regards,   Helene Kelp

## 2019-11-15 ENCOUNTER — Other Ambulatory Visit: Payer: Self-pay

## 2019-11-15 ENCOUNTER — Ambulatory Visit (INDEPENDENT_AMBULATORY_CARE_PROVIDER_SITE_OTHER): Payer: 59 | Admitting: Cardiovascular Disease

## 2019-11-15 ENCOUNTER — Encounter: Payer: Self-pay | Admitting: Cardiovascular Disease

## 2019-11-15 VITALS — BP 126/78 | HR 84 | Ht 69.0 in | Wt 226.5 lb

## 2019-11-15 DIAGNOSIS — I4892 Unspecified atrial flutter: Secondary | ICD-10-CM

## 2019-11-15 DIAGNOSIS — I10 Essential (primary) hypertension: Secondary | ICD-10-CM | POA: Diagnosis not present

## 2019-11-15 MED ORDER — RIVAROXABAN 20 MG PO TABS: 20.0000 mg | ORAL_TABLET | Freq: Every day | ORAL | 6 refills | Status: DC

## 2019-11-15 NOTE — Patient Instructions (Addendum)
Medication Instructions:  - Your physician has recommended you make the following change in your medication:   1) START Xarelto: 20 mg- take 1 tablet by mouth once daily in the evening with food  Samples Given: Xarelto 20 mg Lot: [id number] Exp: 08/2021 # 3 bottles  *If you need a refill on your cardiac medications before your next appointment, please call your pharmacy*   Lab Work: - none ordered  If you have labs (blood work) drawn today and your tests are completely normal, you will receive your results only by: Marland Kitchen MyChart Message (if you have MyChart) OR . A paper copy in the mail If you have any lab test that is abnormal or we need to change your treatment, we will call you to review the results.   Testing/Procedures: - Your physician has requested that you have an echocardiogram (before the EP appointment). Echocardiography is a painless test that uses sound waves to create images of your heart. It provides your doctor with information about the size and shape of your heart and how well your heart's chambers and valves are working. This procedure takes approximately one hour. There are no restrictions for this procedure. There is a possibility that an IV may need to be started during your test to inject an image enhancing agent. This is done to obtain more optimal pictures of your heart. Therefore we ask that you do at least drink some water prior to coming in to hydrate your veins.    Follow-Up: At [facility], you and your health needs are our priority.  As part of our continuing mission to provide you with exceptional heart care, we have created designated Provider Care Teams.  These Care Teams include your primary Cardiologist (physician) and Advanced Practice Providers (APPs -  Physician Assistants and Nurse Practitioners) who all work together to provide you with the care you need, when you need it.  We recommend signing up for the patient portal called "MyChart".  Sign up  information is provided on this After Visit Summary.  MyChart is used to connect with patients for Virtual Visits (Telemedicine).  Patients are able to view lab/test results, encounter notes, upcoming appointments, etc.  Non-urgent messages can be sent to your provider as well.   To learn more about what you can do with MyChart, go to [email or web address].    Your next appointment:   You have been referred to Dr. Caryl Comes Dr. Quentin Ore (first available)    The format for your next appointment:   In Person  Provider:   as above   Other Instructions   Xarelto (Rivaroxaban) oral tablets What is this medicine? RIVAROXABAN (ri va ROX a ban) is an anticoagulant (blood thinner). It is used to treat blood clots in the lungs or in the veins. It is also used to prevent blood clots in the lungs or in the veins. It is also used to lower the chance of stroke in people with a medical condition called atrial fibrillation. This medicine may be used for other purposes; ask your health care provider or pharmacist if you have questions. COMMON BRAND NAME(S): Xarelto, Xarelto Starter Pack What should I tell my health care provider before I take this medicine? They need to know if you have any of these conditions:  antiphospholipid antibody syndrome  artificial heart valve  bleeding disorders  bleeding in the brain  blood in your stools (black or tarry stools) or if you have blood in your vomit  history of blood clots  history of stomach bleeding  kidney disease  liver disease  low blood counts, like low white cell, platelet, or red cell counts  recent or planned spinal or epidural procedure  take medicines that treat or prevent blood clots  an unusual or allergic reaction to rivaroxaban, other medicines, foods, dyes, or preservatives  pregnant or trying to get pregnant  breast-feeding How should I use this medicine? Take this medicine by mouth with a glass of water. Follow the  directions on the prescription label. Take your medicine at regular intervals. Do not take it more often than directed. Do not stop taking except on your doctor's advice. Stopping this medicine may increase your risk of a blood clot. Be sure to refill your prescription before you run out of medicine. If you are taking this medicine after hip or knee replacement surgery, take it with or without food. If you are taking this medicine for atrial fibrillation, take it with your evening meal. If you are taking this medicine to treat blood clots, take it with food at the same time each day. If you are unable to swallow your tablet, you may crush the tablet and mix it in applesauce. Then, immediately eat the applesauce. You should eat more food right after you eat the applesauce containing the crushed tablet. Talk to your pediatrician regarding the use of this medicine in children. Special care may be needed. Overdosage: If you think you have taken too much of this medicine contact a poison control center or emergency room at once. NOTE: This medicine is only for you. Do not share this medicine with others. What if I miss a dose? If you take your medicine once a day and miss a dose, take the missed dose as soon as you remember. If it is almost time for your next dose, take only that dose. Do not take double or extra doses. If you take your medicine twice a day and miss a dose, take the missed dose immediately. In this instance, 2 tablets may be taken at the same time. The next day you should take 1 tablet twice a day as directed. What may interact with this medicine? Do not take this medicine with any of the following medications:  defibrotide This medicine may also interact with the following medications:  aspirin and aspirin-like medicines  certain antibiotics like erythromycin, azithromycin, and clarithromycin  certain medicines for fungal infections like ketoconazole and itraconazole  certain  medicines for irregular heart beat like amiodarone, quinidine, dronedarone  certain medicines for seizures like carbamazepine, phenytoin  certain medicines that treat or prevent blood clots like warfarin, enoxaparin, and dalteparin  conivaptan  felodipine  indinavir  lopinavir; ritonavir  NSAIDS, medicines for pain and inflammation, like ibuprofen or naproxen  ranolazine  rifampin  ritonavir  SNRIs, medicines for depression, like desvenlafaxine, duloxetine, levomilnacipran, venlafaxine  SSRIs, medicines for depression, like citalopram, escitalopram, fluoxetine, fluvoxamine, paroxetine, sertraline  St. John's wort  verapamil This list may not describe all possible interactions. Give your health care provider a list of all the medicines, herbs, non-prescription drugs, or dietary supplements you use. Also tell them if you smoke, drink alcohol, or use illegal drugs. Some items may interact with your medicine. What should I watch for while using this medicine? Visit your healthcare professional for regular checks on your progress. You may need blood work done while you are taking this medicine. Your condition will be monitored carefully while you are receiving this medicine. It is important not to miss  any appointments. Avoid sports and activities that might cause injury while you are using this medicine. Severe falls or injuries can cause unseen bleeding. Be careful when using sharp tools or knives. Consider using an Copy. Take special care brushing or flossing your teeth. Report any injuries, bruising, or red spots on the skin to your healthcare professional. If you are going to need surgery or other procedure, tell your healthcare professional that you are taking this medicine. Wear a medical ID bracelet or chain. Carry a card that describes your disease and details of your medicine and dosage times. What side effects may I notice from receiving this medicine? Side effects  that you should report to your doctor or health care professional as soon as possible:  allergic reactions like skin rash, itching or hives, swelling of the face, lips, or tongue  back pain  redness, blistering, peeling or loosening of the skin, including inside the mouth  signs and symptoms of bleeding such as bloody or black, tarry stools; red or dark-brown urine; spitting up blood or brown material that looks like coffee grounds; red spots on the skin; unusual bruising or bleeding from the eye, gums, or nose  signs and symptoms of a blood clot such as chest pain; shortness of breath; pain, swelling, or warmth in the leg  signs and symptoms of a stroke such as changes in vision; confusion; trouble speaking or understanding; severe headaches; sudden numbness or weakness of the face, arm or leg; trouble walking; dizziness; loss of coordination Side effects that usually do not require medical attention (report to your doctor or health care professional if they continue or are bothersome):  dizziness  muscle pain This list may not describe all possible side effects. Call your doctor for medical advice about side effects. You may report side effects to FDA at 1-800-FDA-1088. Where should I keep my medicine? Keep out of the reach of children. Store at room temperature between 15 and 30 degrees C (59 and 86 degrees F). Throw away any unused medicine after the expiration date. NOTE: This sheet is a summary. It may not cover all possible information. If you have questions about this medicine, talk to your doctor, pharmacist, or health care provider.  2020 Elsevier/Gold Standard (2018-03-26 09:45:59)
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
## 2019-11-27 ENCOUNTER — Telehealth: Payer: Self-pay | Admitting: Family Medicine

## 2019-11-27 NOTE — Telephone Encounter (Signed)
Patient called stating that she will be in town from Thursday, 11/28/2019 through Monday, 12/02/2019 and is asking to be worked in for back and neck OMT.  Please advise.

## 2019-11-27 NOTE — Telephone Encounter (Signed)
Returned call went to VM. Left a message. Sent mychart message as well.

## 2019-11-28 ENCOUNTER — Ambulatory Visit (INDEPENDENT_AMBULATORY_CARE_PROVIDER_SITE_OTHER): Payer: 59 | Admitting: Family Medicine

## 2019-11-28 ENCOUNTER — Other Ambulatory Visit: Payer: Self-pay

## 2019-11-28 ENCOUNTER — Encounter: Payer: Self-pay | Admitting: Family Medicine

## 2019-11-28 VITALS — BP 140/90 | HR 79 | Ht 69.0 in | Wt 227.0 lb

## 2019-11-28 DIAGNOSIS — M5136 Other intervertebral disc degeneration, lumbar region: Secondary | ICD-10-CM

## 2019-11-28 DIAGNOSIS — Z9889 Other specified postprocedural states: Secondary | ICD-10-CM | POA: Diagnosis not present

## 2019-11-28 DIAGNOSIS — F411 Generalized anxiety disorder: Secondary | ICD-10-CM

## 2019-11-28 DIAGNOSIS — M999 Biomechanical lesion, unspecified: Secondary | ICD-10-CM

## 2019-11-28 DIAGNOSIS — F430 Acute stress reaction: Secondary | ICD-10-CM

## 2019-11-28 NOTE — Assessment & Plan Note (Addendum)
Patient is having chronic pain.  Recently did know we have the diagnosis of atrial flutter.  Is now on a blood thinner.  Complicates picture.  Has responded to an epidural previously but I do not think that this would be possible right now.  Patient likely will be on the blood thinner for at least 6 weeks before they do a try an ablation.  Patient encouraged to limit the amount of the naproxen.  Patient is looking for something else for pain control.  We discussed potential Effexor but patient did have unfortunately serotonin syndrome which then decreases everything else in that class.  Unable to tolerate tramadol, declined pain medications which I agree with.  We discussed other possibilities such as Wellbutrin and patient will check with primary care provider as well as low-dose of nortriptyline for nighttime relief.  Would keep a low dose secondary to the atrial flutter.  Patient will consider it.  Follow-up with me again 4 to 8 weeks

## 2019-11-28 NOTE — Assessment & Plan Note (Signed)
Will discuss with patient's primary care.  Patient may be a candidate for Wellbutrin with her having serotonin syndrome.
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
## 2019-11-28 NOTE — Patient Instructions (Addendum)
Good to see you You should ask about Wellbutrin Read about nortriptyline Consider half tab metoprolol  Try the exercises when you can See me again in 4-5 weeks

## 2019-11-29 ENCOUNTER — Other Ambulatory Visit: Payer: Self-pay

## 2019-11-29 ENCOUNTER — Ambulatory Visit (INDEPENDENT_AMBULATORY_CARE_PROVIDER_SITE_OTHER): Payer: 59

## 2019-11-29 DIAGNOSIS — I4892 Unspecified atrial flutter: Secondary | ICD-10-CM | POA: Diagnosis not present

## 2019-11-29 LAB — ECHOCARDIOGRAM COMPLETE
AR max vel: 2.89 cm2
AV Area VTI: 2.91 cm2
AV Area mean vel: 2.56 cm2
AV Mean grad: 4 mmHg
AV Peak grad: 7.6 mmHg
Ao pk vel: 1.38 m/s
Area-P 1/2: 3.65 cm2
S' Lateral: 2.8 cm
Single Plane A4C EF: 69.5 %

## 2019-11-29 MED ORDER — PERFLUTREN LIPID MICROSPHERE
1.0000 mL | INTRAVENOUS | Status: AC | PRN
  Administered 2019-11-29: 2 mL via INTRAVENOUS

## 2019-12-03 ENCOUNTER — Encounter: Payer: Self-pay | Admitting: Family Medicine

## 2019-12-03 ENCOUNTER — Telehealth: Payer: Self-pay

## 2019-12-03 NOTE — Telephone Encounter (Signed)
-----   Message from Wellington Hampshire, MD sent at 12/02/2019  4:09 PM EST ----- Inform patient that echo was normal.

## 2019-12-03 NOTE — Telephone Encounter (Signed)
Called to give the patient echo results. [email or web address] 

## 2019-12-03 NOTE — Telephone Encounter (Signed)
Results sent to the patient through mychart as requested.

## 2019-12-03 NOTE — Telephone Encounter (Signed)
Patient returning call [place] and may have bad reception  If possible, it might be best to send results through [employer]

## 2019-12-11 ENCOUNTER — Encounter: Payer: Self-pay | Admitting: Cardiology

## 2019-12-11 ENCOUNTER — Other Ambulatory Visit: Payer: Self-pay

## 2019-12-11 ENCOUNTER — Ambulatory Visit (INDEPENDENT_AMBULATORY_CARE_PROVIDER_SITE_OTHER): Payer: 59 | Admitting: Cardiology

## 2019-12-11 VITALS — BP 134/80 | HR 66 | Ht 69.0 in | Wt 233.0 lb

## 2019-12-11 DIAGNOSIS — Z01812 Encounter for preprocedural laboratory examination: Secondary | ICD-10-CM | POA: Diagnosis not present

## 2019-12-11 DIAGNOSIS — I10 Essential (primary) hypertension: Secondary | ICD-10-CM

## 2019-12-11 DIAGNOSIS — I4892 Unspecified atrial flutter: Secondary | ICD-10-CM

## 2019-12-11 DIAGNOSIS — I480 Paroxysmal atrial fibrillation: Secondary | ICD-10-CM | POA: Diagnosis not present

## 2019-12-11 NOTE — Patient Instructions (Signed)
Medication Instructions:  Your physician recommends that you continue on your current medications as directed. Please refer to the Current Medication list given to you today.  *If you need a refill on your cardiac medications before your next appointment, please call your pharmacy*   Lab Work: None ordered If you have labs (blood work) drawn today and your tests are completely normal, you will receive your results only by: . MyChart Message (if you have MyChart) OR . A paper copy in the mail If you have any lab test that is abnormal or we need to change your treatment, we will call you to review the results.   Testing/Procedures: Your physician has requested that you have cardiac CT within 7 days PRIOR to your ablation. Cardiac computed tomography (CT) is a painless test that uses an x-ray machine to take clear, detailed pictures of your heart. Please follow instructions below located under "other instructions".  Your physician has recommended that you have an ablation. Catheter ablation is a medical procedure used to treat some cardiac arrhythmias (irregular heartbeats). During catheter ablation, a long, thin, flexible tube is put into a blood vessel in your groin (upper thigh), or neck. This tube is called an ablation catheter. It is then guided to your heart through the blood vessel. Radio frequency waves destroy small areas of heart tissue where abnormal heartbeats may cause an arrhythmia to start. Please follow instructions below located under "other instructions".    Follow-Up: At [facility], you and your health needs are our priority.  As part of our continuing mission to provide you with exceptional heart care, we have created designated Provider Care Teams.  These Care Teams include your primary Cardiologist (physician) and Advanced Practice Providers (APPs -  Physician Assistants and Nurse Practitioners) who all work together to provide you with the care you need, when you need  it.  Your next appointment:   4 week(s) after your ablation pm 01/24/20  The format for your next appointment:   In Person  Provider:   You will follow up in the Atrial Fibrillation Clinic located at [facility]. Your provider will be: Donna Carroll, NP or Clint R. Fenton, PA-C  Your physician recommends that you schedule a follow-up appointment in: 3 months, after your ablation, with Dr. Lambert.   Thank you for choosing [facility]!!    Other Instructions  CT INSTRUCTIONS Your cardiac CT will be scheduled at:  [facility] [address] [place], [place] [address] [phone]  Please arrive 15 mins early for check-in and test prep.  Please follow these instructions carefully (unless otherwise directed):  On the Night Before the Test: . Be sure to Drink plenty of water. . Do not consume any caffeinated/decaffeinated beverages or chocolate 12 hours prior to your test. . Do not take any antihistamines 12 hours prior to your test.  On the Day of the Test: . Drink plenty of water. Do not drink any water within one hour of the test. . Do not eat any food 4 hours prior to the test. . You may take your regular medications prior to the test.  . Take your metoprolol (Lopressor) 12.5 mg two hours prior to test. . HOLD Furosemide/Hydrochlorothiazide morning of the test. . FEMALES- please wear underwire-free bra if available      After the Test: . Drink plenty of water. . After receiving IV contrast, you may experience a mild flushed feeling. This is normal. . On occasion,   you may experience a mild rash up to 24 hours after the test. This is not dangerous. If this occurs, you can take Benadryl 25 mg and increase your fluid intake. . If you experience trouble breathing, this can be serious. If it is severe call 911 IMMEDIATELY. If it is mild, please call our office. . If you take any of these medications:  Glipizide/Metformin, Avandament, Glucavance, please do not take 48 hours after completing test unless otherwise instructed.   Once we have confirmed authorization from your insurance company, we will call you to set up a date and time for your test. Based on how quickly your insurance processes prior authorizations requests, please allow up to 4 weeks to be contacted for scheduling your Cardiac CT appointment. Be advised that routine Cardiac CT appointments could be scheduled as many as 8 weeks after your provider has ordered it.  For non-scheduling related questions, please contact the cardiac imaging nurse navigator should you have any questions/concerns: Marchia Bond, Cardiac Imaging Nurse Navigator Burley Saver, Interim Cardiac Imaging Nurse [place] [facility] Direct Office Dial: [phone]   For scheduling needs, including cancellations and rescheduling, please call [place], [phone].     Electrophysiology/Ablation Procedure Instructions   You are scheduled for a(n) Atrial Fibrillation ablation on 01/24/2020 with Dr. Lars Mage.   1.   Pre procedure testing-             A.  LAB WORK --- between  12/26/19 - 01/15/20 for your pre procedure blood work.  You do NOT need to be fasting. You can stop by the [place] [facility] for the lab work.               B. COVID TEST-- On 01/22/2020 @ 11:00 am - This is a Drive Up Visit at [employer] at [facility]  Someone will direct you to the appropriate testing line. Stay in your car and someone will be with you shortly.   After you are tested please go home and self quarantine until the day of your procedure.     2. On the day of your procedure 01/24/2020 you will go to [facility] [phone]) at 5:30 am.  Dennis Bast will go to the main entrance A [employer]) and enter where the [employer] are.  Your driver will drop you off and you will head down the hallway to ADMITTING.  You may have one support  person come in to the hospital with you.  They will be asked to wait in the waiting room.   3.   Do not eat or drink after midnight prior to your procedure.   4.   Do not miss any doses of your blood thinner prior to the morning of your procedure or your procedure will need to be rescheduled.       Do NOT take any medications the morning of your procedure.   5.  Plan for an overnight stay, but you may be discharged home after your procedure.    If you use your phone frequently bring your phone charger, in case you have to stay.  If you are discharged after your procedure you will need someone to drive you home and be with your for 24 hours after your procedure.   6. You will follow up with the AFIB clinic 4 weeks after your procedure.  You will follow up with Dr. Quentin Ore 3 months after your procedure.  These appointments  will be made for you.   * If you have ANY questions please call the office [phone] and ask for Jenny RN or send me a MyChart message   * Occasionally, EP Studies and ablations can become lengthy.  Please make your family aware of this before your procedure starts.  Average time ranges from 2-8 hours for EP studies/ablations.  Your physician will call your family after the procedure with the results.                                     Cardiac Ablation Cardiac ablation is a procedure to disable (ablate) a small amount of heart tissue in very specific places. The heart has many electrical connections. Sometimes these connections are abnormal and can cause the heart to beat very fast or irregularly. Ablating some of the problem areas can improve the heart rhythm or return it to normal. Ablation may be done for people who:  Have Wolff-Parkinson-White syndrome.  Have fast heart rhythms (tachycardia).  Have taken medicines for an abnormal heart rhythm (arrhythmia) that were not effective or caused side effects.  Have a high-risk heartbeat that may be  life-threatening. During the procedure, a small incision is made in the neck or the groin, and a long, thin, flexible tube (catheter) is inserted into the incision and moved to the heart. Small devices (electrodes) on the tip of the catheter will send out electrical currents. A type of X-ray (fluoroscopy) will be used to help guide the catheter and to provide images of the heart. Tell a health care provider about:  Any allergies you have.  All medicines you are taking, including vitamins, herbs, eye drops, creams, and over-the-counter medicines.  Any problems you or family members have had with anesthetic medicines.  Any blood disorders you have.  Any surgeries you have had.  Any medical conditions you have, such as kidney failure.  Whether you are pregnant or may be pregnant. What are the risks? Generally, this is a safe procedure. However, problems may occur, including:  Infection.  Bruising and bleeding at the catheter insertion site.  Bleeding into the chest, especially into the sac that surrounds the heart. This is a serious complication.  Stroke or blood clots.  Damage to other structures or organs.  Allergic reaction to medicines or dyes.  Need for a permanent pacemaker if the normal electrical system is damaged. A pacemaker is a small computer that sends electrical signals to the heart and helps your heart beat normally.  The procedure not being fully effective. This may not be recognized until months later. Repeat ablation procedures are sometimes required. What happens before the procedure?  Follow instructions from your health care provider about eating or drinking restrictions.  Ask your health care provider about: ? Changing or stopping your regular medicines. This is especially important if you are taking diabetes medicines or blood thinners. ? Taking medicines such as aspirin and ibuprofen. These medicines can thin your blood. Do not take these medicines before  your procedure if your health care provider instructs you not to.  Plan to have someone take you home from the hospital or clinic.  If you will be going home right after the procedure, plan to have someone with you for 24 hours. What happens during the procedure?  To lower your risk of infection: ? Your health care team will wash or sanitize their hands. ? Your   skin will be washed with soap. ? Hair may be removed from the incision area.  An IV tube will be inserted into one of your veins.  You will be given a medicine to help you relax (sedative).  The skin on your neck or groin will be numbed.  An incision will be made in your neck or your groin.  A needle will be inserted through the incision and into a large vein in your neck or groin.  A catheter will be inserted into the needle and moved to your heart.  Dye may be injected through the catheter to help your surgeon see the area of the heart that needs treatment.  Electrical currents will be sent from the catheter to ablate heart tissue in desired areas. There are three types of energy that may be used to ablate heart tissue: ? Heat (radiofrequency energy). ? Laser energy. ? Extreme cold (cryoablation).  When the necessary tissue has been ablated, the catheter will be removed.  Pressure will be held on the catheter insertion area to prevent excessive bleeding.  A bandage (dressing) will be placed over the catheter insertion area. The procedure may vary among health care providers and hospitals. What happens after the procedure?  Your blood pressure, heart rate, breathing rate, and blood oxygen level will be monitored until the medicines you were given have worn off.  Your catheter insertion area will be monitored for bleeding. You will need to lie still for a few hours to ensure that you do not bleed from the catheter insertion area.  Do not drive for 24 hours or as long as directed by your health care  provider. Summary  Cardiac ablation is a procedure to disable (ablate) a small amount of heart tissue in very specific places. Ablating some of the problem areas can improve the heart rhythm or return it to normal.  During the procedure, electrical currents will be sent from the catheter to ablate heart tissue in desired areas. This information is not intended to replace advice given to you by your health care provider. Make sure you discuss any questions you have with your health care provider. Document Revised: 06/19/2017 Document Reviewed: 11/16/2015 Elsevier Patient Education  [place].
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
## 2019-12-26 ENCOUNTER — Ambulatory Visit (INDEPENDENT_AMBULATORY_CARE_PROVIDER_SITE_OTHER): Payer: 59 | Admitting: Family Medicine

## 2019-12-26 ENCOUNTER — Other Ambulatory Visit: Payer: Self-pay

## 2019-12-26 ENCOUNTER — Encounter: Payer: Self-pay | Admitting: Family Medicine

## 2019-12-26 VITALS — BP 132/90 | HR 63 | Ht 69.0 in | Wt 229.0 lb

## 2019-12-26 DIAGNOSIS — M5136 Other intervertebral disc degeneration, lumbar region: Secondary | ICD-10-CM

## 2019-12-26 DIAGNOSIS — M999 Biomechanical lesion, unspecified: Secondary | ICD-10-CM | POA: Diagnosis not present
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
## 2019-12-26 NOTE — Patient Instructions (Signed)
Flexeril at night You will do great with the heart See me again in 6-7 weeks

## 2019-12-27 NOTE — Addendum Note (Signed)
Addended by: Britt Bottom on: 12/27/2019 02:25 PM   Modules accepted: Orders

## 2020-01-11 DIAGNOSIS — I4891 Unspecified atrial fibrillation: Secondary | ICD-10-CM

## 2020-01-11 HISTORY — DX: Unspecified atrial fibrillation: I48.91

## 2020-01-13 ENCOUNTER — Telehealth (HOSPITAL_COMMUNITY): Payer: Self-pay | Admitting: *Deleted

## 2020-01-13 NOTE — Telephone Encounter (Signed)
Attempted to call patient regarding upcoming cardiac CT appointment. Left message on voicemail with name and callback number  Javarie Crisp RN Navigator Cardiac Imaging [facility] [phone] Office [phone] Cell  

## 2020-01-13 NOTE — Telephone Encounter (Signed)
Patient returning call regarding upcoming cardiac imaging study; pt verbalizes understanding of appt date/time, parking situation and where to check in, pre-test NPO status and medications ordered, and verified current allergies; name and call back number provided for further questions should they arise  Samanda Buske RN Navigator Cardiac Imaging [facility] [phone] office [phone] cell  

## 2020-01-14 ENCOUNTER — Other Ambulatory Visit
Admission: RE | Admit: 2020-01-14 | Discharge: 2020-01-14 | Disposition: A | Discharge status: Home / Self Care | Payer: 59 | Attending: Cardiology | Admitting: Cardiology

## 2020-01-14 DIAGNOSIS — I4892 Unspecified atrial flutter: Secondary | ICD-10-CM | POA: Diagnosis present

## 2020-01-14 DIAGNOSIS — Z01812 Encounter for preprocedural laboratory examination: Secondary | ICD-10-CM | POA: Insufficient documentation

## 2020-01-14 DIAGNOSIS — I480 Paroxysmal atrial fibrillation: Secondary | ICD-10-CM | POA: Insufficient documentation

## 2020-01-14 LAB — BASIC METABOLIC PANEL
Anion gap: 8 (ref 5–15)
BUN: 16 mg/dL (ref 6–20)
CO2: 26 mmol/L (ref 22–32)
Calcium: 8.8 mg/dL — ABNORMAL LOW (ref 8.9–10.3)
Chloride: 102 mmol/L (ref 98–111)
Creatinine, Ser: 0.65 mg/dL (ref 0.44–1.00)
GFR, Estimated: >60 mL/min (ref >60)
Glucose, Bld: 107 mg/dL — ABNORMAL HIGH (ref 70–99)
Potassium: 3.8 mmol/L (ref 3.5–5.1)
Sodium: 136 mmol/L (ref 135–145)

## 2020-01-14 LAB — CBC
HCT: 36.1 % (ref 36.0–46.0)
Hemoglobin: 11.6 g/dL — ABNORMAL LOW (ref 12.0–15.0)
MCH: 27.4 pg (ref 26.0–34.0)
MCHC: 32.1 g/dL (ref 30.0–36.0)
MCV: 85.1 fL (ref 80.0–100.0)
Platelets: 265 10*3/uL (ref 150–400)
RBC: 4.24 MIL/uL (ref 3.87–5.11)
RDW: 14.1 % (ref 11.5–15.5)
WBC: 9.9 10*3/uL (ref 4.0–10.5)
nRBC: 0 % (ref 0.0–0.2)

## 2020-01-15 ENCOUNTER — Ambulatory Visit
Admission: RE | Admit: 2020-01-15 | Discharge: 2020-01-15 | Disposition: A | Discharge status: Home / Self Care | Payer: 59 | Source: Ambulatory Visit | Attending: Cardiology | Admitting: Cardiology

## 2020-01-15 ENCOUNTER — Other Ambulatory Visit: Payer: Self-pay

## 2020-01-15 DIAGNOSIS — I4892 Unspecified atrial flutter: Secondary | ICD-10-CM | POA: Diagnosis not present

## 2020-01-15 DIAGNOSIS — I480 Paroxysmal atrial fibrillation: Secondary | ICD-10-CM | POA: Insufficient documentation

## 2020-01-15 MED ORDER — IOHEXOL 350 MG/ML SOLN
100.0000 mL | Freq: Once | INTRAVENOUS | Status: AC | PRN
  Administered 2020-01-15: 85 mL via INTRAVENOUS

## 2020-01-21 ENCOUNTER — Other Ambulatory Visit: Payer: Self-pay | Admitting: Internal Medicine

## 2020-01-22 ENCOUNTER — Other Ambulatory Visit: Payer: Self-pay

## 2020-01-22 ENCOUNTER — Telehealth: Payer: Self-pay

## 2020-01-22 ENCOUNTER — Other Ambulatory Visit
Admission: RE | Admit: 2020-01-22 | Discharge: 2020-01-22 | Disposition: A | Discharge status: Home / Self Care | Payer: 59 | Source: Ambulatory Visit | Attending: Cardiology | Admitting: Cardiology

## 2020-01-22 DIAGNOSIS — Z20822 Contact with and (suspected) exposure to covid-19: Secondary | ICD-10-CM | POA: Diagnosis not present

## 2020-01-22 DIAGNOSIS — Z01812 Encounter for preprocedural laboratory examination: Secondary | ICD-10-CM | POA: Insufficient documentation

## 2020-01-22 NOTE — Telephone Encounter (Signed)
Pt will be rescheduled to February 17, 2020 at 7:30 am for afib ablation

## 2020-01-23 LAB — SARS CORONAVIRUS 2 (TAT 6-24 HRS): SARS Coronavirus 2: NEGATIVE

## 2020-01-29 NOTE — Telephone Encounter (Signed)
Pt rescheduled.  Covid test scheduled for 05/14/63  Precert and scheduling aware of change.  Updated letter sent via mychart.

## 2020-01-31 ENCOUNTER — Telehealth: Payer: Self-pay | Admitting: Cardiology

## 2020-01-31 NOTE — Telephone Encounter (Signed)
Left message for Pt.  Advised her covid test scheduled for 2/4 IS scheduled for [place] where she got her last test.  Advised scheduling was aware her f/u needed to be moved.  This nurse cancelled appt to avoid confusion and sent additional staff message to scheduler.  Advised to call back or send mychart if any further questions.

## 2020-01-31 NOTE — Telephone Encounter (Signed)
Patient calling to speak with Sonia Baller. She states she has her pre admission labs on 02/15/2020 but wants it ordered at [facility] since it's closer to her. She states she did this the last time. She also states her follow up with the afib clinic is 02/25/2020, which is supposed to be a 1 month follow up from her procedure 02/17/2020. She states this needs to be rescheduled a month out from her procedure.

## 2020-02-03 ENCOUNTER — Other Ambulatory Visit (INDEPENDENT_AMBULATORY_CARE_PROVIDER_SITE_OTHER): Payer: 59

## 2020-02-03 ENCOUNTER — Telehealth: Payer: Self-pay | Admitting: Internal Medicine

## 2020-02-03 DIAGNOSIS — J029 Acute pharyngitis, unspecified: Secondary | ICD-10-CM

## 2020-02-03 LAB — POCT INFLUENZA A/B
Influenza A, POC: NEGATIVE
Influenza B, POC: NEGATIVE

## 2020-02-03 LAB — POCT RAPID STREP A (OFFICE): Rapid Strep A Screen: NEGATIVE

## 2020-02-03 NOTE — Telephone Encounter (Signed)
Orders have been placed and pt has been scheduled for a lab appt this afternoon. Pt is aware of appt time.

## 2020-02-03 NOTE — Telephone Encounter (Signed)
Spoke with pt and she has been scheduled for testing this afternoon. See telephone encounter.

## 2020-02-03 NOTE — Telephone Encounter (Signed)
PLEASE ADD HER TO THIS AFTERNOON'S TESTING SCHEDULE,  I HAVE RESPONDED TO HER VIA CELL TEXT .  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

## 2020-02-05 LAB — SARS-COV-2, NAA 2 DAY TAT

## 2020-02-05 LAB — NOVEL CORONAVIRUS, NAA: SARS-CoV-2, NAA: DETECTED — AB

## 2020-02-06 ENCOUNTER — Ambulatory Visit: Payer: 59 | Admitting: Family Medicine
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
## 2020-02-13 ENCOUNTER — Other Ambulatory Visit: Payer: Self-pay

## 2020-02-13 ENCOUNTER — Ambulatory Visit (INDEPENDENT_AMBULATORY_CARE_PROVIDER_SITE_OTHER): Payer: 59

## 2020-02-13 ENCOUNTER — Encounter: Payer: Self-pay | Admitting: Family Medicine

## 2020-02-13 ENCOUNTER — Ambulatory Visit (INDEPENDENT_AMBULATORY_CARE_PROVIDER_SITE_OTHER): Payer: 59 | Admitting: Family Medicine

## 2020-02-13 VITALS — BP 110/80 | HR 83 | Ht 69.0 in | Wt 221.0 lb

## 2020-02-13 DIAGNOSIS — M5136 Other intervertebral disc degeneration, lumbar region: Secondary | ICD-10-CM | POA: Diagnosis not present

## 2020-02-13 DIAGNOSIS — M542 Cervicalgia: Secondary | ICD-10-CM

## 2020-02-13 DIAGNOSIS — M545 Low back pain, unspecified: Secondary | ICD-10-CM | POA: Diagnosis not present

## 2020-02-13 DIAGNOSIS — G8929 Other chronic pain: Secondary | ICD-10-CM | POA: Diagnosis not present

## 2020-02-13 DIAGNOSIS — M999 Biomechanical lesion, unspecified: Secondary | ICD-10-CM | POA: Diagnosis not present

## 2020-02-13 DIAGNOSIS — G5603 Carpal tunnel syndrome, bilateral upper limbs: Secondary | ICD-10-CM

## 2020-02-13 NOTE — Assessment & Plan Note (Signed)
Known degenerative disc disease.  Has responded well to osteopathic manipulation but having some more radicular symptoms.  Patient does have significant amount of discomfort.  Negative straight leg test but unfortunately is starting to fail all other conservative therapy.  Patient does have radicular symptoms going into the gluteal region and concern for an L4-L5 nerve root impingement.  I do feel that advanced imaging would be warranted.  Due to patient's other allergies to different medications we do have limited ways to treat her and I do think that an epidural would be beneficial for her.  Patient is in agreement with the plan.  Follow-up with me again after imaging we will discuss further.

## 2020-02-13 NOTE — Patient Instructions (Signed)
MRI lumbar spine [phone] You will do great with ablation After MRI will write you and discuss if epidural is appropriate Will see you 3 weeks after epidural Take flexeril at night

## 2020-02-13 NOTE — Assessment & Plan Note (Signed)
History of carpal tunnel on the side and could consider the possibility of a nerve conduction with this being within the differential if MRI does not show anything significant.

## 2020-02-13 NOTE — Assessment & Plan Note (Signed)
Patient is also having some radicular symptoms down the left arm.  Attempted more muscle energy.  Patient is having more difficulty and does have a positive radicular symptoms.  We will get neck x-rays today.  Patient does not have any significant weakness but it is concerning.  We will get an MRI of the neck as well with patient having a history of surgical intervention.  Patient could be a potential candidate for epidurals but with patient being on a blood thinner and going through ablation we will see if patient is able to get off the blood thinner.  Follow-up with me again after imaging to discuss

## 2020-02-14 ENCOUNTER — Other Ambulatory Visit: Payer: 59

## 2020-02-14 NOTE — Progress Notes (Signed)
Attempted to call patient regarding instructions for Monday's procedure.  Left voice on the following items.  Instructed patient on the following items: Arrival time 0530 Nothing to eat or drink after midnight No meds AM of procedure Responsible person to drive you home and stay with you for 24 hrs  Have you missed any doses of anti-coagulant Xarelot- be sure not to miss any doses

## 2020-02-15 ENCOUNTER — Other Ambulatory Visit (HOSPITAL_COMMUNITY): Payer: 59

## 2020-02-16 NOTE — Anesthesia Preprocedure Evaluation (Addendum)
Anesthesia Evaluation  Patient identified by MRN, date of birth, ID band Patient awake    Reviewed: Allergy & Precautions, NPO status , Patient's Chart, lab work & pertinent test results, reviewed documented beta blocker date and time   History of Anesthesia Complications Negative for: history of anesthetic complications  Airway Mallampati: II  TM Distance: >3 FB Neck ROM: Full    Dental  (+) Missing,    Pulmonary neg pulmonary ROS,    Pulmonary exam normal        Cardiovascular hypertension, Pt. on medications and Pt. on home beta blockers + dysrhythmias (on Xarelto) Atrial Fibrillation  Rhythm:Irregular Rate:Normal  TTE 11/2019: EF 60-65%, valves ok   Neuro/Psych Anxiety S/p cervical fusion    GI/Hepatic Neg liver ROS, GERD  Medicated and Controlled,  Endo/Other  diabetes, Type 2  Renal/GU negative Renal ROS  negative genitourinary   Musculoskeletal  (+) Arthritis ,   Abdominal   Peds  Hematology negative hematology ROS (+)   Anesthesia Other Findings Day of surgery medications reviewed with patient.  Reproductive/Obstetrics negative OB ROS                           Anesthesia Physical Anesthesia Plan  ASA: III  Anesthesia Plan: General   Post-op Pain Management:    Induction: Intravenous  PONV Risk Score and Plan: 3 and Treatment may vary due to age or medical condition, Ondansetron, Dexamethasone and Midazolam  Airway Management Planned: Oral ETT  Additional Equipment: None  Intra-op Plan:   Post-operative Plan: Extubation in OR  Informed Consent: I have reviewed the patients History and Physical, chart, labs and discussed the procedure including the risks, benefits and alternatives for the proposed anesthesia with the patient or authorized representative who has indicated his/her understanding and acceptance.     Dental advisory given  Plan Discussed with:  CRNA  Anesthesia Plan Comments:        Anesthesia Quick Evaluation

## 2020-02-17 ENCOUNTER — Ambulatory Visit (HOSPITAL_COMMUNITY)
Admission: RE | Admit: 2020-02-17 | Discharge: 2020-02-17 | Disposition: A | Discharge status: Home / Self Care | Payer: 59 | Attending: Cardiology | Admitting: Cardiology

## 2020-02-17 ENCOUNTER — Ambulatory Visit (HOSPITAL_COMMUNITY): Payer: 59 | Admitting: Anesthesiology

## 2020-02-17 ENCOUNTER — Encounter (HOSPITAL_COMMUNITY)
Admission: RE | Disposition: A | Discharge status: Home / Self Care | Payer: 59 | Source: Home / Self Care | Attending: Cardiology

## 2020-02-17 ENCOUNTER — Other Ambulatory Visit: Payer: Self-pay

## 2020-02-17 DIAGNOSIS — I10 Essential (primary) hypertension: Secondary | ICD-10-CM | POA: Diagnosis not present

## 2020-02-17 DIAGNOSIS — I480 Paroxysmal atrial fibrillation: Secondary | ICD-10-CM | POA: Diagnosis not present

## 2020-02-17 DIAGNOSIS — Z881 Allergy status to other antibiotic agents status: Secondary | ICD-10-CM | POA: Insufficient documentation

## 2020-02-17 DIAGNOSIS — Z888 Allergy status to other drugs, medicaments and biological substances status: Secondary | ICD-10-CM | POA: Insufficient documentation

## 2020-02-17 DIAGNOSIS — Z8249 Family history of ischemic heart disease and other diseases of the circulatory system: Secondary | ICD-10-CM | POA: Diagnosis not present

## 2020-02-17 DIAGNOSIS — I4892 Unspecified atrial flutter: Secondary | ICD-10-CM | POA: Insufficient documentation

## 2020-02-17 DIAGNOSIS — Z79899 Other long term (current) drug therapy: Secondary | ICD-10-CM | POA: Diagnosis not present

## 2020-02-17 DIAGNOSIS — Z7901 Long term (current) use of anticoagulants: Secondary | ICD-10-CM | POA: Diagnosis not present

## 2020-02-17 DIAGNOSIS — Z885 Allergy status to narcotic agent status: Secondary | ICD-10-CM | POA: Diagnosis not present

## 2020-02-17 HISTORY — PX: ATRIAL FIBRILLATION ABLATION: EP1191

## 2020-02-17 LAB — BASIC METABOLIC PANEL
Anion gap: 11 (ref 5–15)
BUN: 14 mg/dL (ref 6–20)
CO2: 24 mmol/L (ref 22–32)
Calcium: 8.8 mg/dL — ABNORMAL LOW (ref 8.9–10.3)
Chloride: 101 mmol/L (ref 98–111)
Creatinine, Ser: 0.72 mg/dL (ref 0.44–1.00)
GFR, Estimated: >60 mL/min (ref >60)
Glucose, Bld: 131 mg/dL — ABNORMAL HIGH (ref 70–99)
Potassium: 3.6 mmol/L (ref 3.5–5.1)
Sodium: 136 mmol/L (ref 135–145)

## 2020-02-17 LAB — CBC
HCT: 39.4 % (ref 36.0–46.0)
Hemoglobin: 12.3 g/dL (ref 12.0–15.0)
MCH: 26.8 pg (ref 26.0–34.0)
MCHC: 31.2 g/dL (ref 30.0–36.0)
MCV: 85.8 fL (ref 80.0–100.0)
Platelets: 270 10*3/uL (ref 150–400)
RBC: 4.59 MIL/uL (ref 3.87–5.11)
RDW: 14.2 % (ref 11.5–15.5)
WBC: 9.8 10*3/uL (ref 4.0–10.5)
nRBC: 0 % (ref 0.0–0.2)

## 2020-02-17 LAB — POCT ACTIVATED CLOTTING TIME
Activated Clotting Time: 315 seconds
Activated Clotting Time: 380 seconds

## 2020-02-17 SURGERY — ATRIAL FIBRILLATION ABLATION
Anesthesia: General

## 2020-02-17 MED ORDER — HEPARIN (PORCINE) IN NACL 1000-0.9 UT/500ML-% IV SOLN
INTRAVENOUS | Status: AC
  Filled 2020-02-17: qty 500

## 2020-02-17 MED ORDER — RIVAROXABAN 20 MG PO TABS: 20.0000 mg | ORAL_TABLET | Freq: Every morning | ORAL | 6 refills | Status: DC

## 2020-02-17 MED ORDER — PHENYLEPHRINE HCL-NACL 10-0.9 MG/250ML-% IV SOLN
INTRAVENOUS | Status: DC | PRN
  Administered 2020-02-17: 50 ug/min via INTRAVENOUS

## 2020-02-17 MED ORDER — HEPARIN SODIUM (PORCINE) 1000 UNIT/ML IJ SOLN
INTRAMUSCULAR | Status: DC | PRN
  Administered 2020-02-17: 4000 [IU] via INTRAVENOUS
  Administered 2020-02-17: 17000 [IU] via INTRAVENOUS

## 2020-02-17 MED ORDER — HEPARIN SODIUM (PORCINE) 1000 UNIT/ML IJ SOLN
INTRAMUSCULAR | Status: DC | PRN
  Administered 2020-02-17: 1000 [IU] via INTRAVENOUS

## 2020-02-17 MED ORDER — PROPOFOL 10 MG/ML IV BOLUS
INTRAVENOUS | Status: DC | PRN
  Administered 2020-02-17: 180 mg via INTRAVENOUS
  Administered 2020-02-17: 20 mg via INTRAVENOUS

## 2020-02-17 MED ORDER — ONDANSETRON HCL 4 MG/2ML IJ SOLN
INTRAMUSCULAR | Status: DC | PRN
  Administered 2020-02-17: 4 mg via INTRAVENOUS

## 2020-02-17 MED ORDER — SODIUM CHLORIDE 0.9% FLUSH: 3.0000 mL | INTRAVENOUS | Status: DC | PRN

## 2020-02-17 MED ORDER — DEXAMETHASONE SODIUM PHOSPHATE 10 MG/ML IJ SOLN
INTRAMUSCULAR | Status: DC | PRN
  Administered 2020-02-17: 10 mg via INTRAVENOUS

## 2020-02-17 MED ORDER — SODIUM CHLORIDE 0.9 % IV SOLN: 250.0000 mL | INTRAVENOUS | Status: DC | PRN

## 2020-02-17 MED ORDER — SODIUM CHLORIDE 0.9% FLUSH: 3.0000 mL | Freq: Two times a day (BID) | INTRAVENOUS | Status: DC

## 2020-02-17 MED ORDER — PANTOPRAZOLE SODIUM 40 MG PO TBEC
40.0000 mg | DELAYED_RELEASE_TABLET | Freq: Every day | ORAL | Status: DC
  Administered 2020-02-17: 40 mg via ORAL
  Filled 2020-02-17: qty 1

## 2020-02-17 MED ORDER — LACTATED RINGERS IV SOLN: INTRAVENOUS | Status: DC | PRN

## 2020-02-17 MED ORDER — LIDOCAINE 2% (20 MG/ML) 5 ML SYRINGE
INTRAMUSCULAR | Status: DC | PRN
  Administered 2020-02-17: 40 mg via INTRAVENOUS

## 2020-02-17 MED ORDER — HEPARIN SODIUM (PORCINE) 1000 UNIT/ML IJ SOLN
INTRAMUSCULAR | Status: AC
  Filled 2020-02-17: qty 1

## 2020-02-17 MED ORDER — ROCURONIUM BROMIDE 10 MG/ML (PF) SYRINGE
PREFILLED_SYRINGE | INTRAVENOUS | Status: DC | PRN
  Administered 2020-02-17: 60 mg via INTRAVENOUS
  Administered 2020-02-17: 40 mg via INTRAVENOUS

## 2020-02-17 MED ORDER — ONDANSETRON HCL 4 MG/2ML IJ SOLN
4.0000 mg | Freq: Four times a day (QID) | INTRAMUSCULAR | Status: DC | PRN
  Administered 2020-02-17: 4 mg via INTRAVENOUS

## 2020-02-17 MED ORDER — MIDAZOLAM HCL 2 MG/2ML IJ SOLN
INTRAMUSCULAR | Status: DC | PRN
  Administered 2020-02-17: 2 mg via INTRAVENOUS

## 2020-02-17 MED ORDER — SUGAMMADEX SODIUM 200 MG/2ML IV SOLN
INTRAVENOUS | Status: DC | PRN
  Administered 2020-02-17: 200 mg via INTRAVENOUS

## 2020-02-17 MED ORDER — PHENYLEPHRINE 40 MCG/ML (10ML) SYRINGE FOR IV PUSH (FOR BLOOD PRESSURE SUPPORT)
PREFILLED_SYRINGE | INTRAVENOUS | Status: DC | PRN
  Administered 2020-02-17: 160 ug via INTRAVENOUS

## 2020-02-17 MED ORDER — PROTAMINE SULFATE 10 MG/ML IV SOLN
INTRAVENOUS | Status: DC | PRN
  Administered 2020-02-17: 30 mg via INTRAVENOUS

## 2020-02-17 MED ORDER — FENTANYL CITRATE (PF) 250 MCG/5ML IJ SOLN
INTRAMUSCULAR | Status: DC | PRN
  Administered 2020-02-17 (×2): 50 ug via INTRAVENOUS

## 2020-02-17 MED ORDER — HEPARIN (PORCINE) IN NACL 1000-0.9 UT/500ML-% IV SOLN
INTRAVENOUS | Status: DC | PRN
  Administered 2020-02-17 (×4): 500 mL

## 2020-02-17 MED ORDER — SODIUM CHLORIDE 0.9 % IV SOLN: INTRAVENOUS | Status: DC

## 2020-02-17 MED ORDER — ACETAMINOPHEN 325 MG PO TABS
650.0000 mg | ORAL_TABLET | ORAL | Status: DC | PRN
  Administered 2020-02-17: 650 mg via ORAL
  Filled 2020-02-17: qty 2

## 2020-02-17 MED ORDER — ONDANSETRON HCL 4 MG/2ML IJ SOLN
INTRAMUSCULAR | Status: AC
  Filled 2020-02-17: qty 2

## 2020-02-17 MED ORDER — RIVAROXABAN 20 MG PO TABS
20.0000 mg | ORAL_TABLET | Freq: Every day | ORAL | Status: DC
  Administered 2020-02-17: 20 mg via ORAL
  Filled 2020-02-17: qty 1

## 2020-02-17 SURGICAL SUPPLY — 20 items
BLANKET WARM UNDERBOD FULL ACC (MISCELLANEOUS) ×2 IMPLANT
CATH MAPPNG PENTARAY F 2-6-2MM (CATHETERS) ×1 IMPLANT
CATH S CIRCA THERM PROBE 10F (CATHETERS) ×2 IMPLANT
CATH SMTCH THERMOCOOL SF DF (CATHETERS) ×2 IMPLANT
CATH SOUNDSTAR ECO 8FR (CATHETERS) ×2 IMPLANT
CATH WEB BI DIR CSDF CRV REPRO (CATHETERS) ×2 IMPLANT
CLOSURE PERCLOSE PROSTYLE (VASCULAR PRODUCTS) ×6 IMPLANT
COVER SWIFTLINK CONNECTOR (BAG) ×2 IMPLANT
MAT PREVALON FULL STRYKER (MISCELLANEOUS) ×2 IMPLANT
PACK EP LATEX FREE (CUSTOM PROCEDURE TRAY) ×2
PACK EP LF (CUSTOM PROCEDURE TRAY) ×1 IMPLANT
PAD PRO RADIOLUCENT 2001M-C (PAD) ×2 IMPLANT
PATCH CARTO3 (PAD) ×2 IMPLANT
PENTARAY F 2-6-2MM (CATHETERS) ×2
SHEATH BAYLIS TRANSSEPTAL 98CM (NEEDLE) ×2 IMPLANT
SHEATH CARTO VIZIGO SM CVD (SHEATH) ×2 IMPLANT
SHEATH PINNACLE 8F 10CM (SHEATH) ×4 IMPLANT
SHEATH PINNACLE 9F 10CM (SHEATH) ×2 IMPLANT
SHEATH PROBE COVER 6X72 (BAG) ×2 IMPLANT
TUBING SMART ABLATE COOLFLOW (TUBING) ×2 IMPLANT

## 2020-02-17 NOTE — Anesthesia Procedure Notes (Signed)
Procedure Name: Intubation Date/Time: 02/17/2020 7:37 AM Performed by: Lance Coon, CRNA Pre-anesthesia Checklist: Patient identified, Emergency Drugs available, Suction available, Patient being monitored and Timeout performed Patient Re-evaluated:Patient Re-evaluated prior to induction Oxygen Delivery Method: Circle system utilized Preoxygenation: Pre-oxygenation with 100% oxygen Induction Type: IV induction Ventilation: Mask ventilation without difficulty Laryngoscope Size: Miller and 3 Grade View: Grade I Tube type: Oral Tube size: 7.0 mm Number of attempts: 1 Airway Equipment and Method: Stylet Placement Confirmation: ETT inserted through vocal cords under direct vision,  positive ETCO2 and breath sounds checked- equal and bilateral Secured at: 21 cm Tube secured with: Tape Dental Injury: Teeth and Oropharynx as per pre-operative assessment

## 2020-02-17 NOTE — Progress Notes (Signed)
Pt c/o throat pain and groin discomfort, I offered to get cough drop order but pt declined, I offered to get additional order for tylenol and pt said she would wait to get home for  further relief

## 2020-02-17 NOTE — Discharge Instructions (Signed)
Cardiac Ablation, Care After This sheet gives you information about how to care for yourself after your procedure. Your health care provider may also give you more specific instructions. If you have problems or questions, contact your health care provider. What can I expect after the procedure? After the procedure, it is common to have:  Bruising around the insertion site.  Tenderness around the insertion site.  Skipped heartbeats.  Tiredness (fatigue). Follow these instructions at home: Insertion site care  Follow instructions from your health care provider about how to take care of your insertion site. Make sure you: ? Wash your hands with soap and water for at least 20 seconds before and after you change your bandage (dressing). If soap and water are not available, use hand sanitizer. ? Change your dressing as told by your health care provider. ? Leave stitches (sutures), skin glue, or adhesive strips in place. These skin closures may need to stay in place for up to 2 weeks. If adhesive strip edges start to loosen and curl up, you may trim the loose edges. Do not remove adhesive strips completely unless your health care provider tells you to do that.  Check your insertion site every day for signs of infection. Check for: ? More redness, swelling, or pain. ? Fluid or blood. ? Warmth. ? Pus or a bad smell.  If your insertion site starts to bleed, lie down on your back, apply firm pressure to the area, and contact your health care provider.   Driving  If you were given a sedative during the procedure, it can affect you for several hours. Do not drive or operate machinery until your health care provider says that it is safe.  Ask your health care provider when it is safe for you to drive again after the procedure.   Activity  Avoid activities that take a lot of effort for at least 3 days after your procedure.  Do not lift anything that is heavier than 10 lb (4.5 kg), or the limit  that you are told, until your health care provider says that it is safe.  Return to your normal activities as told by your health care provider. Ask your health care provider what activities are safe for you.   General instructions  Take over-the-counter and prescription medicines only as told by your health care provider.  Do not use any products that contain nicotine or tobacco, such as cigarettes, e-cigarettes, and chewing tobacco. If you need help quitting, ask your health care provider.  Do not take baths, swim, or use a hot tub until your health care provider approves. Ask your health care provider if you may take showers. You may only be allowed to take sponge baths.  Do not drink alcohol for 24 hours after your procedure.  Keep all follow-up visits as told by your health care provider. This is important. Contact a health care provider if you:  Notice these things around the catheter insertion site: ? More redness, swelling, or pain. ? Fluid or blood that stops after applying firm pressure to the area. ? Warmth when you touch the area. ? Pus or a bad smell.  Have a fever.  Are sweating a lot.  Feel nauseous.  Have pain or numbness in the arm or leg closest to your insertion site. Get help right away if:  Your insertion site suddenly swells.  Your insertion site is bleeding and the bleeding does not stop after applying firm pressure to the area.  You   have chest pain or discomfort that spreads to your neck, jaw, or arm.  You have a fast or irregular heartbeat.  You have shortness of breath.  You are dizzy or light-headed and feel the need to lie down. These symptoms may represent a serious problem that is an emergency. Do not wait to see if the symptoms will go away. Get medical help right away. Call your local emergency services (911 in the [place] Do not drive yourself to the hospital. Summary  After the procedure, it is common to have bruising and tenderness at the  insertion site in your groin or your neck.  Check your insertion site every day for more redness, swelling, or pain. These are signs of infection.  Contact a health care provider if you notice any signs of infection. Also, contact a health care provider if you start sweating a lot, feel nauseous, or have pain or numbness in the arm or leg closest to your insertion site.  Get help right away if your puncture site is bleeding and the bleeding does not stop after applying firm pressure to the area. This is a medical emergency. This information is not intended to replace advice given to you by your health care provider. Make sure you discuss any questions you have with your health care provider. Document Revised: 11/05/2018 Document Reviewed: 11/05/2018 Elsevier Patient Education  2021 [employer]  
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
## 2020-02-17 NOTE — Anesthesia Postprocedure Evaluation (Signed)
Anesthesia Post Note  Patient: Patricia [name]  Procedure(s) Performed: ATRIAL FIBRILLATION ABLATION (N/A )     Patient location during evaluation: PACU Anesthesia Type: General Level of consciousness: awake and alert and oriented Pain management: pain level controlled Vital Signs Assessment: post-procedure vital signs reviewed and stable Respiratory status: spontaneous breathing, nonlabored ventilation and respiratory function stable Cardiovascular status: blood pressure returned to baseline Postop Assessment: no apparent nausea or vomiting Anesthetic complications: no   No complications documented.  Last Vitals:  Vitals:   02/17/20 1224 02/17/20 1230  BP: 121/74 128/78  Pulse: 88 91  Resp: 15 17  Temp:    SpO2: 96% 95%    Last Pain:  Vitals:   02/17/20 1117  TempSrc:   PainSc: [place]

## 2020-02-17 NOTE — Transfer of Care (Signed)
Immediate Anesthesia Transfer of Care Note  Patient: Makaylah Jyl Heinz  Procedure(s) Performed: ATRIAL FIBRILLATION ABLATION (N/A )  Patient Location: Cath Lab  Anesthesia Type:General  Level of Consciousness: drowsy and patient cooperative  Airway & Oxygen Therapy: Patient Spontanous Breathing and Patient connected to nasal cannula oxygen  Post-op Assessment: Report given to RN and Post -op Vital signs reviewed and stable  Post vital signs: Reviewed and stable  Last Vitals:  Vitals Value Taken Time  BP    Temp 36.7 C 02/17/20 1026  Pulse    Resp    SpO2      Last Pain:  Vitals:   02/17/20 1026  TempSrc: Temporal  PainSc: Asleep      Patients Stated Pain Goal: 5 (38/45/36 4680)  Complications: No complications documented.

## 2020-02-18 ENCOUNTER — Encounter (HOSPITAL_COMMUNITY): Payer: Self-pay | Admitting: Cardiology

## 2020-02-20 ENCOUNTER — Telehealth: Payer: Self-pay | Admitting: Cardiology

## 2020-02-20 NOTE — Telephone Encounter (Signed)
Patient is following up regarding her ablation on 02/17/20. She would like to make Dr. Quentin Ore aware of her post-op symptoms. She states she is experiencing a soreness deep in her throat. Per patient, she was informed that an extra tube was placed in her esophagus to ensure it did not get too warm. She assumes this may have caused the soreness. She states she has also had chills, body aches, and a low grade temperature of 99. She states these symptoms are worsening every day.  Please return call to discuss further.

## 2020-02-20 NOTE — Telephone Encounter (Signed)
Called patient about her symptoms. Patient stated her throat is very tender and sore, and it feels like the soreness goes deep in the throat towards her ribs. Patient had COVID last month and stated it feels like she is getting sick, because she is having chills, body aches, and low grade temp. Patient also stated that her husband was positive for strep and COVID last month. Patient stated she expected to have some soreness to her throat, but she stated it is not getting any better when gargling with salt water, and feels like it is getting worse instead of better. Will forward to Dr. Quentin Ore for advisement.

## 2020-02-21 ENCOUNTER — Ambulatory Visit (HOSPITAL_COMMUNITY): Payer: 59 | Admitting: Nurse Practitioner

## 2020-02-21 NOTE — Telephone Encounter (Signed)
Patient sent mychart message as well. Consulted, Dr. Curt Bears, another EP doctor about patient symptoms. He stated having a sore throat is normal, but her other symptoms sound like she needs to see her PCP. Called patient to let her know this and if Dr. Quentin Ore has any further advisement, we will call her back. Patient verbalized understanding.

## 2020-02-24 ENCOUNTER — Other Ambulatory Visit: Payer: Self-pay | Admitting: *Deleted

## 2020-02-24 ENCOUNTER — Telehealth: Payer: Self-pay | Admitting: Cardiology

## 2020-02-24 MED ORDER — COLCHICINE 0.6 MG PO TABS: ORAL_TABLET | ORAL | 0 refills | Status: DC

## 2020-02-24 NOTE — Telephone Encounter (Signed)
RX sent to [facility] for Colchicine 0.6 mg BID x 5 days as per Dr. Quentin Ore.   See MyChart message.

## 2020-02-24 NOTE — Telephone Encounter (Signed)
Patient called to verify that [facility] is a good pharmacy to send her Colchicine prescription to. (see recent patient schedule message)

## 2020-02-24 NOTE — Telephone Encounter (Signed)
Called to speak with patient. Low grade temperatures have subsided. Patient with some discomfort with deep breathing and while laying flat.   I suspect there is an element of pericarditis contributing to her symptoms.  Plan for Colchicine 0.6mg  PO BID for 5 days. If she develops diarrhea/GI upset on Colchicine, recommend she reduce the dose to 0.6mg  PO once daily for 5 days.  Lysbeth Galas T. Quentin Ore, MD, [facility] Cardiac Electrophysiology

## 2020-02-26 LAB — HM DIABETES EYE EXAM

## 2020-03-04 ENCOUNTER — Ambulatory Visit
Admission: RE | Admit: 2020-03-04 | Discharge: 2020-03-04 | Disposition: A | Discharge status: Home / Self Care | Payer: 59 | Source: Ambulatory Visit | Attending: Family Medicine | Admitting: Family Medicine

## 2020-03-04 DIAGNOSIS — M545 Low back pain, unspecified: Secondary | ICD-10-CM

## 2020-03-04 DIAGNOSIS — M542 Cervicalgia: Secondary | ICD-10-CM

## 2020-03-05 ENCOUNTER — Encounter: Payer: Self-pay | Admitting: Family Medicine

## 2020-03-09 ENCOUNTER — Other Ambulatory Visit: Payer: Self-pay

## 2020-03-09 ENCOUNTER — Other Ambulatory Visit: Payer: Self-pay | Admitting: Cardiology

## 2020-03-09 ENCOUNTER — Telehealth: Payer: Self-pay | Admitting: *Deleted

## 2020-03-09 ENCOUNTER — Encounter: Payer: Self-pay | Admitting: Family Medicine

## 2020-03-09 DIAGNOSIS — M5416 Radiculopathy, lumbar region: Secondary | ICD-10-CM

## 2020-03-09 NOTE — Telephone Encounter (Signed)
   [facility] Pre-operative Risk Assessment    HEARTCARE STAFF: - Please ensure there is not already an duplicate clearance open for this procedure. - Under Visit Info/Reason for Call, type in Other and utilize the format Clearance MM/DD/YY or Clearance TBD. Do not use dashes or single digits. - If request is for dental extraction, please clarify the # of teeth to be extracted.  Request for surgical clearance:  1. What type of surgery is being performed? NERVE ROOT BLOCK INJECTION  2. When is this surgery scheduled? TBD   3. What type of clearance is required (medical clearance vs. Pharmacy clearance to hold med vs. Both)? BOTH  4. Are there any medications that need to be held prior to surgery and how long? [place]   5. Practice name and name of physician performing surgery? [facility]   6. What is the office phone number? [phone]   7.   What is the office fax number? [phone] ATTN: CATHY C.   8.   Anesthesia type (None, local, MAC, general) ? LOCAL   Julaine Hua 03/09/2020, 10:57 AM  _________________________________________________________________   (provider comments below)

## 2020-03-09 NOTE — Telephone Encounter (Signed)
   Chart reviewed per Preop Clinic Protocol.  50 yo female with: - Atrial Flutter/Fibrillation; s/p PVI and CTI ablation 02/17/20 - HTN - DM2 (no insulin) - Hx of COVID-19 pneumonia 01/2020 - 02/17/2020: Creatinine, Ser 0.72  - No hx of CVA   Echocardiogram 11/29/19: normal EF, no sig valve dz CT Cardiac 01/15/20: Ca2+ score 0  Will fwd to Dr. Quentin Ore given recent AFib and AFlutter ablation to determine when pt is able to hold anticoagulation.    Richardson Dopp, PA-C    03/09/2020 12:19 PM

## 2020-03-10 ENCOUNTER — Other Ambulatory Visit: Payer: Self-pay

## 2020-03-10 ENCOUNTER — Other Ambulatory Visit: Payer: Self-pay | Admitting: Family Medicine

## 2020-03-10 ENCOUNTER — Telehealth: Payer: Self-pay

## 2020-03-10 DIAGNOSIS — M5136 Other intervertebral disc degeneration, lumbar region: Secondary | ICD-10-CM

## 2020-03-10 DIAGNOSIS — M5410 Radiculopathy, site unspecified: Secondary | ICD-10-CM

## 2020-03-10 DIAGNOSIS — M5412 Radiculopathy, cervical region: Secondary | ICD-10-CM

## 2020-03-10 NOTE — Telephone Encounter (Signed)
Patient with diagnosis of aflutter/fib on Xarelto for anticoagulation.    Procedure: NERVE ROOT BLOCK INJECTION Date of procedure: TBD   CHA2DS2-VASc Score = 3  This indicates a 3.2% annual risk of stroke. The patient's score is based upon: CHF History: No HTN History: Yes Diabetes History: Yes Stroke History: No Vascular Disease History: No Age Score: 0 Gender Score: 1     CrCl 116 ml/min  Due to patients recent afib ablation, she should not hold anticoagulation until at least 05/17/20. Therefore procedure should not be scheduled prior to 05/20/20.   AFTER 05/16/20 patient may hold Xarelto for 3 days prior to procedure. Since this is a few months away, if clinical picture changes, then clearance would need to be re-evaluated.

## 2020-03-10 NOTE — Telephone Encounter (Signed)
   Per Dr. Quentin Ore, She needs to stay on uninterrupted anticoagulation through 05/16/2020.   Duplicate phone notes in patient's chart.  Please see phone note from 03/09/2020. Richardson Dopp, PA-C    03/10/2020 2:00 PM

## 2020-03-10 NOTE — Telephone Encounter (Signed)
Dr Holley Raring would like your approval to stop Xarelto for 3 days so that the patient may have a Lumbar Epidural Steroid injection.  If you would please just send me a message back and let me know if it is OK with you to stop for 3 days prior to procedure.  Thanks

## 2020-03-11 ENCOUNTER — Other Ambulatory Visit: Payer: Self-pay

## 2020-03-11 ENCOUNTER — Ambulatory Visit (HOSPITAL_COMMUNITY)
Admission: RE | Admit: 2020-03-11 | Discharge: 2020-03-11 | Disposition: A | Discharge status: Home / Self Care | Payer: 59 | Source: Ambulatory Visit | Attending: Nurse Practitioner | Admitting: Nurse Practitioner

## 2020-03-11 ENCOUNTER — Encounter (HOSPITAL_COMMUNITY): Payer: Self-pay | Admitting: Nurse Practitioner

## 2020-03-11 VITALS — BP 118/82 | HR 86 | Ht 69.0 in | Wt 222.8 lb

## 2020-03-11 DIAGNOSIS — Z79899 Other long term (current) drug therapy: Secondary | ICD-10-CM | POA: Diagnosis not present

## 2020-03-11 DIAGNOSIS — I4891 Unspecified atrial fibrillation: Secondary | ICD-10-CM | POA: Diagnosis present

## 2020-03-11 DIAGNOSIS — I480 Paroxysmal atrial fibrillation: Secondary | ICD-10-CM

## 2020-03-11 DIAGNOSIS — Z7901 Long term (current) use of anticoagulants: Secondary | ICD-10-CM | POA: Insufficient documentation

## 2020-03-11 DIAGNOSIS — M5412 Radiculopathy, cervical region: Secondary | ICD-10-CM

## 2020-03-11 DIAGNOSIS — M542 Cervicalgia: Secondary | ICD-10-CM

## 2020-03-11 DIAGNOSIS — Z8249 Family history of ischemic heart disease and other diseases of the circulatory system: Secondary | ICD-10-CM | POA: Diagnosis not present

## 2020-03-11 DIAGNOSIS — D6869 Other thrombophilia: Secondary | ICD-10-CM

## 2020-03-11 DIAGNOSIS — I4892 Unspecified atrial flutter: Secondary | ICD-10-CM | POA: Diagnosis not present
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
## 2020-03-11 NOTE — Telephone Encounter (Signed)
Patient called stating that she heard from Pain Management, they told her that cardiology said she can not stop the blood thinner utill may so they will not clear her to get in epidural.  She wanted to know what she can do for the pain.

## 2020-03-12 ENCOUNTER — Encounter: Payer: Self-pay | Admitting: Student in an Organized Health Care Education/Training Program

## 2020-03-12 ENCOUNTER — Ambulatory Visit
Payer: 59 | Attending: Student in an Organized Health Care Education/Training Program | Admitting: Student in an Organized Health Care Education/Training Program

## 2020-03-12 VITALS — BP 144/103 | HR 93 | Temp 97.2°F | Resp 16 | Ht 69.0 in | Wt 220.0 lb

## 2020-03-12 DIAGNOSIS — G894 Chronic pain syndrome: Secondary | ICD-10-CM | POA: Insufficient documentation

## 2020-03-12 DIAGNOSIS — M5136 Other intervertebral disc degeneration, lumbar region: Secondary | ICD-10-CM | POA: Insufficient documentation

## 2020-03-12 DIAGNOSIS — M5416 Radiculopathy, lumbar region: Secondary | ICD-10-CM | POA: Insufficient documentation

## 2020-03-12 DIAGNOSIS — M5410 Radiculopathy, site unspecified: Secondary | ICD-10-CM | POA: Diagnosis present

## 2020-03-12 MED ORDER — GABAPENTIN 300 MG PO CAPS
ORAL_CAPSULE | ORAL | 1 refills | Status: DC
Start: 2020-03-12 — End: 2020-04-01

## 2020-03-12 NOTE — Progress Notes (Addendum)
PROVIDER NOTE: Information contained herein reflects review and annotations entered in association with encounter. Interpretation of such information and data should be left to medically-trained personnel. Information provided to patient can be located elsewhere in the medical record under "Patient Instructions". Document created using STT-dictation technology, any transcriptional errors that may result from process are unintentional.    Patient: Patricia [name]  Service Category: E/M  Provider: Edward Jolly, MD  DOB: 11/04/1970  DOS: 03/12/2020  Specialty: Interventional Pain Management  MRN: [id number]  Setting: Ambulatory outpatient  PCP: Sherlene Shams, MD  Type: Established Patient    Referring Provider: Sherlene Shams, MD  Location: Office  Delivery: Face-to-face     HPI  [name], a 50 y.o. year old female, is here today because of her Radiculopathy affecting upper extremity [M54.10]. Ms. Kiraly primary complain today is Neck Pain and Back Pain (Lower, left)  Pertinent problems: Ms. Draper has Cervical radiculopathy; Neuroforaminal stenosis of cervical spine; Right lateral epicondylitis; and Serotonin syndrome on their pertinent problem list. Pain Assessment: Severity of Chronic pain is reported as a 4 /10. Location: Back Left/denies. Onset: More than a month ago. Quality: Burning,Sore. Timing: Constant. Modifying factor(s): ibuprfen helps best. Vitals:  height is 5\' 9"  (1.753 m) and weight is 220 lb (99.8 kg). Her temporal temperature is 97.2 F (36.2 C) (abnormal). Her blood pressure is 144/103 (abnormal) and her pulse is 93. Her respiration is 16 and oxygen saturation is 100%.   Reason for encounter: follow-up evaluation    Patient follows up today to discuss treatment plan given that she cannot stop anticoagulation until May from a cardiac standpoint so we will need to hold off on any elective neuraxial procedures (I.e. cervical/lumbar epidural).  She  recently had a cardiac ablation done and developed mild pericarditis from this however that has since resolved. She has tried gabapentin in the past which was somewhat helpful, given increased radicular pain, recommend patient restart gabapentin as below.  Titration instructions provided. Avoid opioid analgesics   ROS  Constitutional: Denies any fever or chills Gastrointestinal: No reported hemesis, hematochezia, vomiting, or acute GI distress Musculoskeletal: Denies any acute onset joint swelling, redness, loss of ROM, or weakness Neurological: No reported episodes of acute onset apraxia, aphasia, dysarthria, agnosia, amnesia, paralysis, loss of coordination, or loss of consciousness  Medication Review  ALPRAZolam, Biotin, OVER THE COUNTER MEDICATION, Vitamin D, acetaminophen, cyclobenzaprine, fluticasone, gabapentin, loratadine, losartan, metoprolol tartrate, montelukast, omeprazole, pramipexole, predniSONE, rivaroxaban, and zolpidem  History Review  Allergy: Ms. Rasmuson is allergic to venlafaxine, tramadol, clarithromycin, and zithromax [azithromycin]. Drug: Ms. Flake  reports no history of drug use. Alcohol:  reports current alcohol use of about 2.0 standard drinks of alcohol per week. Tobacco:  reports that she has never smoked. She has never used smokeless tobacco. Social: Ms. Wisener  reports that she has never smoked. She has never used smokeless tobacco. She reports current alcohol use of about 2.0 standard drinks of alcohol per week. She reports that she does not use drugs. Medical:  has a past medical history of Atrial fibrillation (HCC) (2022), Cervical spondylosis with myelopathy and radiculopathy (01/2017), Concussion (07/2016), Hypertension, Premature atrial contractions, Serotonin syndrome, and Thyroid disease. Surgical: Ms. Gellner  has a past surgical history that includes Abdominal hysterectomy (Dec 2012); Tonsillectomy (May 2012); transthoracic echocardiogram (04/2014);  48 hour Holter Monitor (05/04/2014); Breast lumpectomy (Right, 1991 or 1992); Breast excisional biopsy (Right, 1990); ACDL (02/06/2017); and ATRIAL FIBRILLATION ABLATION (N/A, 02/17/2020). Family: family history includes Alcohol  abuse in her father; Breast cancer in her paternal grandmother; Breast cancer (age of onset: 87) in her mother and another family member; COPD in her father; Cancer in her paternal grandmother; Diabetes in her brother and mother; Heart failure in her father; Hyperlipidemia in her mother; Hypertension in her mother; Mental illness in her father; Mitral valve prolapse in her father.  Laboratory Chemistry Profile   Renal Lab Results  Component Value Date   BUN 14 02/17/2020   CREATININE 0.72 02/17/2020   BCR 11 10/13/2016   GFR 87.16 10/09/2019   GFRAA >60 01/15/2017   GFRNONAA >60 02/17/2020     Hepatic Lab Results  Component Value Date   AST 16 10/09/2019   ALT 16 10/09/2019   ALBUMIN 3.8 10/09/2019   ALKPHOS 58 10/09/2019   HCVAB NEGATIVE 05/12/2014   LIPASE 25 01/15/2017     Electrolytes Lab Results  Component Value Date   NA 136 02/17/2020   K 3.6 02/17/2020   CL 101 02/17/2020   CALCIUM 8.8 (L) 02/17/2020   MG 1.8 10/10/2019     Bone Lab Results  Component Value Date   VD25OH 45.79 06/01/2015     Inflammation (CRP: Acute Phase) (ESR: Chronic Phase) No results found for: CRP, ESRSEDRATE, LATICACIDVEN     Note: Above Lab results reviewed.  Recent Imaging Review  MR Lumbar Spine Wo Contrast CLINICAL DATA:  Low back pain, > 6 wks low back pain  EXAM: MRI LUMBAR SPINE WITHOUT CONTRAST  TECHNIQUE: Multiplanar, multisequence MR imaging of the lumbar spine was performed. No intravenous contrast was administered.  COMPARISON:  04/16/2019 and prior.  FINDINGS: Segmentation:  Standard.  Alignment:  Normal.  Vertebrae: Vertebral body heights are preserved. Small scattered hemangiomata versus focal fat. No aggressive osseous  lesion.  Conus medullaris and cauda equina: Conus extends to the L1 level. Conus and cauda equina appear normal.  Disc levels: Multilevel desiccation and Schmorl's node formation.  L1-2: Minimal disc bulge.  Patent spinal canal and neural foramen.  L2-3: Minimal disc bulge with fall left lateral protrusion. Patent spinal canal and right neural foramen. Mild left neural foraminal narrowing.  L3-4: Minimal disc bulge.  Patent spinal canal and neural foramen.  L4-5: Disc bulge with superimposed left foraminal protrusion/annular fissuring. Anteriorly projecting 0.8 x 0.7 cm synovial cyst arising from the left facet joint effacing the subarticular recess and medially displacing the descending left L5 nerve root. Bilateral facet hypertrophy. Patent right neural foramen. Mild spinal canal and left neural foraminal narrowing.  L5-S1: No significant disc bulge. Patent spinal canal and neural foramen  Paraspinal and other soft tissues: Negative.  IMPRESSION: Left L4-5 foraminal protrusion/annular fissuring and 0.8 cm synovial cyst effacing the subarticular recess with medial displacement of the left L5 nerve root. Mild spinal canal and left neural foraminal narrowing at this level.  Mild left L2-3 neural foraminal narrowing.  Electronically Signed   By: Stana Bunting M.D.   On: 03/04/2020 14:53 MR CERVICAL SPINE WO CONTRAST CLINICAL DATA:  Cervical radiculopathy, prior C-spine surgery cervical spine pain  EXAM: MRI CERVICAL SPINE WITHOUT CONTRAST  TECHNIQUE: Multiplanar, multisequence MR imaging of the cervical spine was performed. No intravenous contrast was administered.  COMPARISON:  02/13/2020 and prior.  FINDINGS: Alignment: Straightening of lordosis. Sequela of C4-7 ACDF. Associated susceptibility artifact limits evaluation.  Vertebrae: Normal bone marrow signal intensity. No focal osseous lesion.  Cord: Normal signal and morphology.  Posterior Fossa,  vertebral arteries: Negative.  Disc levels: Multilevel desiccation  C2-3: No  significant disc bulge. Patent spinal canal and neural foramen  C3-4: Small disc osteophyte complex with uncovertebral degenerative spurring. Patent spinal canal and neural foramen.  C4-5: Sequela of fusion.  Patent spinal canal and neural foramen.  C5-6: Sequela of fusion.  Patent spinal canal neural foramen.  C6-7: Sequela of fusion.  Patent spinal canal and neural foramen.  C7-T1: Right uncovertebral degenerative spurring. Patent spinal canal and neural foramen.  Paraspinal tissues: Negative.  IMPRESSION: Sequela of C4-7 ACDF.  No significant spinal canal or neural foraminal narrowing.  Electronically Signed   By: Stana Bunting M.D.   On: 03/04/2020 14:45 Note: Reviewed        Physical Exam  General appearance: Well nourished, well developed, and well hydrated. In no apparent acute distress Mental status: Alert, oriented x 3 (person, place, & time)       Respiratory: No evidence of acute respiratory distress Eyes: PERLA Vitals: BP (!) 144/103   Pulse 93   Temp (!) 97.2 F (36.2 C) (Temporal)   Resp 16   Ht 5\' 9"  (1.753 m)   Wt 220 lb (99.8 kg)   SpO2 100%   BMI 32.49 kg/m  BMI: Estimated body mass index is 32.49 kg/m as calculated from the following:   Height as of this encounter: 5\' 9"  (1.753 m).   Weight as of this encounter: 220 lb (99.8 kg). Ideal: Ideal body weight: 66.2 kg (145 lb 15.1 oz) Adjusted ideal body weight: 79.6 kg (175 lb 9.1 oz)  Cervical spine pain, evidence of prior cervical spine surgery.  Limited range of motion.  Positive cervicalgia.  Positive Spurling's on the left. Limited range of motion of lumbar spine.  Pain with lumbar extension and lateral rotation.  Positive straight leg raise test on the left.  Assessment   Status Diagnosis  Controlled Controlled Controlled 1. Radiculopathy affecting upper extremity   2. Lumbar degenerative disc disease    3. Lumbar radiculopathy   4. Chronic pain syndrome      Updated Problems: No problems updated.  Plan of Care   Ms. Lisett Pecola [name] has a current medication list which includes the following long-term medication(s): fluticasone, gabapentin, loratadine, losartan, metoprolol tartrate, montelukast, pramipexole, rivaroxaban, and zolpidem.  Per cardiology, patient will need to remain on Xarelto until mid May.  For this reason, cannot proceed with any elective neuraxial procedures.  We will focus on medication management and optimization of nonopioid analgesics.  Recommend patient start gabapentin as below.  Continue Flexeril as needed.  Follow-up in 6 weeks for medication management.  Pharmacotherapy (Medications Ordered): Meds ordered this encounter  Medications  . gabapentin (NEURONTIN) 300 MG capsule    Sig: 300 mg at 9 am, 300 mg at 2 pm, 600 mg at 9 pm    Dispense:  90 capsule    Refill:  1    Fill one day early if pharmacy is closed on scheduled refill date. May substitute for generic if available.  . predniSONE (DELTASONE) 20 MG tablet    Sig: Take 3 tablets (60 mg total) by mouth daily with breakfast for 3 days, THEN 2 tablets (40 mg total) daily with breakfast for 3 days, THEN 1 tablet (20 mg total) daily with breakfast for 3 days.    Dispense:  18 tablet    Refill:  0    Follow-up plan:   Return in about 6 weeks (around 04/23/2020) for Medication Management, virtual.    Recent Visits Date Type Provider Dept  03/12/20 Office Visit  Edward Jolly, MD [facility]  Showing recent visits within past 90 days and meeting all other requirements Future Appointments Date Type Provider Dept  04/23/20 Appointment Edward Jolly, MD [facility]  Showing future appointments within next 90 days and meeting all other requirements  I discussed the assessment and treatment plan with the patient. The patient was provided an opportunity to ask questions and all were  answered. The patient agreed with the plan and demonstrated an understanding of the instructions.  Patient advised to call back or seek an in-person evaluation if the symptoms or condition worsens.  Duration of encounter: 30 minutes.  Note by: Edward Jolly, MD Date: 03/12/2020; Time: 3:42 PM
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
## 2020-03-12 NOTE — Progress Notes (Signed)
Safety precautions to be maintained throughout the outpatient stay will include: orient to surroundings, keep bed in low position, maintain call bell within reach at all times, provide assistance with transfer out of bed and ambulation.  

## 2020-03-13 MED ORDER — PREDNISONE 20 MG PO TABS: ORAL_TABLET | ORAL | 0 refills | Status: AC

## 2020-03-13 NOTE — Addendum Note (Signed)
Addended by: Gillis Santa on: 03/13/2020 03:42 PM   Modules accepted: Orders

## 2020-03-18 ENCOUNTER — Ambulatory Visit: Payer: 59 | Admitting: Student in an Organized Health Care Education/Training Program

## 2020-03-24 ENCOUNTER — Telehealth: Payer: Self-pay

## 2020-03-24 MED ORDER — METOPROLOL TARTRATE 25 MG PO TABS: ORAL_TABLET | ORAL | 3 refills | Status: DC

## 2020-03-24 NOTE — Telephone Encounter (Signed)
Patient has had intermittent radicular symptoms past the knee previously.  Patient has done formal physical therapy as well as home exercises.  Patient did respond well to anti-inflammatories initially but unfortunately with now patient having atrial fibrillation and is on blood thinner unable to use this as a treatment option.  Patient has also had a history of serotonin syndrome and unable to use other pain medications such as Cymbalta or Effexor.  Patient has responded to injections previously in the neck and do feel that patient would be warranted to have this done.  Patient can have the back injections done if it is approved for patient to be off the blood thinner by cardiology but I do not think that that is till June.  I think patient has been referred to pain management otherwise.

## 2020-03-24 NOTE — Telephone Encounter (Signed)
The fax I have for the denial of back injections for patient. I can do an appeal I do not see an option for a peer to peer as of yet. So I will need updated/addened notes or  A letter with the information that they are requesting. The denial states patient has low back pain that travels to hips, the pain does not travel down the legs/ no signs of radiculopathy. There was no documentation of trial and failure of non-invasive  Treatment. The notes are not clear as to what level would be treated. The notes also say that patient is on xarelto, patient must not have coagulopathy or current use of blood thinners.   I can start the appeal process once the information has been given. Thank you.

## 2020-03-24 NOTE — Telephone Encounter (Signed)
Have written a letter of appeal and faxed to insurance to try and overturn the denial. Awaiting response from insurance

## 2020-03-31 ENCOUNTER — Encounter: Payer: Self-pay | Admitting: Student in an Organized Health Care Education/Training Program

## 2020-04-01 ENCOUNTER — Other Ambulatory Visit: Payer: Self-pay | Admitting: Student in an Organized Health Care Education/Training Program

## 2020-04-01 DIAGNOSIS — M5410 Radiculopathy, site unspecified: Secondary | ICD-10-CM

## 2020-04-01 MED ORDER — GABAPENTIN 300 MG PO CAPS: ORAL_CAPSULE | ORAL | 1 refills | Status: DC
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
## 2020-04-09 ENCOUNTER — Ambulatory Visit (INDEPENDENT_AMBULATORY_CARE_PROVIDER_SITE_OTHER): Payer: 59 | Admitting: Family Medicine

## 2020-04-09 ENCOUNTER — Other Ambulatory Visit: Payer: Self-pay

## 2020-04-09 ENCOUNTER — Encounter: Payer: Self-pay | Admitting: Family Medicine

## 2020-04-09 VITALS — BP 144/92 | HR 94 | Ht 69.0 in | Wt 229.0 lb

## 2020-04-09 DIAGNOSIS — M5136 Other intervertebral disc degeneration, lumbar region: Secondary | ICD-10-CM

## 2020-04-09 DIAGNOSIS — M999 Biomechanical lesion, unspecified: Secondary | ICD-10-CM | POA: Diagnosis not present

## 2020-04-09 MED ORDER — PREDNISONE 20 MG PO TABS: 20.0000 mg | ORAL_TABLET | Freq: Every day | ORAL | 0 refills | Status: DC

## 2020-04-09 NOTE — Patient Instructions (Signed)
Good to see you Try the flexeril only at night Prednison 20 mg for 5 days on your trip See me again in 3-4 weeks

## 2020-04-09 NOTE — Assessment & Plan Note (Signed)
Degenerative disc disease and bilateral L5 nerve root impingement noted on the most recent MRI.  Concerned that this is contributing to it as well as the synovial cyst.  Patient is wanting to have the injection but secondary to the blood thinner is unable to do so.  We did bring up the idea of bridging with Lovenox but completely defer to cardiology for the safet  Patient has been told she could be pulled off and may.  Patient is having worsening pain that is affecting her daily activities and she is doing less.  Patient is traveling with some friends and would like to be able to enjoy.  Patient was given some prednisone for a 5-day burst.  Patient knows there is a potential interaction with the blood thinner.  This is a low dose of 20 mg.  Patient will do it only for the short duration.  Hopefully this will help with some of her pain.  Patient has been avoiding other anti-inflammatories during this time which I agree with as well.  Patient did respond a little bit to the manipulation.  Follow-up with me again 4 to 6 weeks

## 2020-04-22 ENCOUNTER — Telehealth: Payer: Self-pay

## 2020-04-22 NOTE — Telephone Encounter (Signed)
LM for patient to call office for pre virtual appointment questions.  

## 2020-04-23 ENCOUNTER — Other Ambulatory Visit: Payer: Self-pay

## 2020-04-23 ENCOUNTER — Ambulatory Visit
Payer: 59 | Attending: Student in an Organized Health Care Education/Training Program | Admitting: Student in an Organized Health Care Education/Training Program

## 2020-04-23 DIAGNOSIS — M5414 Radiculopathy, thoracic region: Secondary | ICD-10-CM

## 2020-04-23 DIAGNOSIS — M5416 Radiculopathy, lumbar region: Secondary | ICD-10-CM

## 2020-04-23 DIAGNOSIS — M5410 Radiculopathy, site unspecified: Secondary | ICD-10-CM

## 2020-04-23 DIAGNOSIS — G894 Chronic pain syndrome: Secondary | ICD-10-CM | POA: Diagnosis not present

## 2020-04-23 NOTE — Progress Notes (Signed)
Patient: Patricia [name]  Service Category: E/M  Provider: Gillis Santa, MD  DOB: 06-Oct-1970  DOS: 04/23/2020  Location: Office  MRN: [id number]  Setting: Ambulatory outpatient  Referring Provider: [name], MD  Type: Established Patient  Specialty: Interventional Pain Management  PCP: [name], MD  Location: Home  Delivery: TeleHealth     Virtual Encounter - Pain Management PROVIDER NOTE: Information contained herein reflects review and annotations entered in association with encounter. Interpretation of such information and data should be left to medically-trained personnel. Information provided to patient can be located elsewhere in the medical record under "Patient Instructions". Document created using STT-dictation technology, any transcriptional errors that may result from process are unintentional.    Contact & Pharmacy Preferred: [phone] Home: [phone] (home) Mobile: [phone] (mobile) E-mail: [email or web address]  [facility] [place], [place] - [place] [address] Phone: [phone] Fax: [facility] [place] [facility] ( [address] [place] [address] Phone: [phone] Fax: [phone]   Pre-screening  [name] offered "in-person" vs "virtual" encounter. She indicated preferring virtual for this encounter.   Reason COVID-19*  Social distancing based on CDC and AMA recommendations.   I contacted [facility] on 04/23/2020 via video conference.      I clearly identified myself as Gillis Santa, MD. I verified that I was speaking with the correct person using two identifiers (Name: Patricia [name], and date of birth: May 07, 1970).  Consent I sought verbal advanced consent from [place] for virtual visit interactions. I informed Patricia [name] of possible security and  privacy concerns, risks, and limitations associated with providing "not-in-person" medical evaluation and management services. I also informed Patricia [name] of the availability of "in-person" appointments. Finally, I informed her that there would be a charge for the virtual visit and that she could be  personally, fully or partially, financially responsible for it. Patricia [name] expressed understanding and agreed to proceed.   Historic Elements   Patricia [name] is a 50 y.o. year old, female patient evaluated today after our last contact on 03/12/2020. Patricia [name]  has a past medical history of Atrial fibrillation ([place]) (2022), Cervical spondylosis with myelopathy and radiculopathy (01/2017), Concussion (07/2016), Hypertension, Premature atrial contractions, Serotonin syndrome, and Thyroid disease. She also  has a past surgical history that includes Abdominal hysterectomy (Dec 2012); Tonsillectomy (May 2012); transthoracic echocardiogram (04/2014); 48 hour Holter Monitor (05/04/2014); Breast lumpectomy (Right, 1991 or 1992); Breast excisional biopsy (Right, 1990); ACDL (02/06/2017); and ATRIAL FIBRILLATION ABLATION (N/A, 02/17/2020). Patricia [name] has a current medication list which includes the following prescription(s): acetaminophen, alprazolam, biotin, vitamin d, cyclobenzaprine, fluticasone, gabapentin, loratadine, losartan, metoprolol tartrate, montelukast, omeprazole, OVER THE COUNTER MEDICATION, pramipexole, prednisone, zolpidem, and rivaroxaban. She  reports that she has never smoked. She has never used smokeless tobacco. She reports current alcohol use of about 2.0 standard drinks of alcohol per week. She reports that she does not use drugs. Patricia [name] is allergic to venlafaxine, tramadol, clarithromycin, and zithromax [azithromycin].   HPI  Today, she is being contacted for worsening of previously known (established) problem  Patient is having increased low back and left leg pain related to  a L4-L5 disc retrusion that is impacting the left L5 nerve root.  Patient is having severe left L5 radicular pain that is impacting her ADLs.  She has failed conservative treatment with NSAIDs, acetaminophen, gabapentin, Lyrica.  Of note, she is off of her Xarelto.  She has completed physical therapy in the past with limited response.  At this point, I think it is reasonable to do a lumbar epidural steroid injection as the patient's pain is worsening, it is impacting her functional status and her quality of life in the context of her failing conservative therapy.  UDS:  Summary  Date Value Ref Range Status  01/23/2017 FINAL  Final    Comment:    ==================================================================== [facility] COMP DRUG ANALYSIS,UR ==================================================================== Test                             Result       Flag       Units Drug Present and Declared for Prescription Verification   Desmethyldiazepam              220          EXPECTED   ng/mg creat   Oxazepam                       302          EXPECTED   ng/mg creat   Temazepam                      448          EXPECTED   ng/mg creat    Desmethyldiazepam, oxazepam, and temazepam are expected    metabolites of diazepam. Desmethyldiazepam and oxazepam are also    expected metabolites of other drugs, including chlordiazepoxide,    prazepam, clorazepate, and halazepam. Oxazepam is an expected    metabolite of temazepam. Oxazepam and temazepam are also    available as scheduled prescription medications.   Hydromorphone                  643          EXPECTED   ng/mg creat    Hydromorphone may be administered as a scheduled prescription    medication; it is also an expected metabolite of hydrocodone.   Gabapentin                     PRESENT      EXPECTED   Acetaminophen                  PRESENT      EXPECTED   Ibuprofen                      PRESENT      EXPECTED Drug Present not Declared for  Prescription Verification   Bupropion                      PRESENT      UNEXPECTED   Hydroxybupropion               PRESENT      UNEXPECTED    Hydroxybupropion is an expected metabolite of bupropion. Drug Absent but Declared for Prescription Verification   Hydrocodone                    Not Detected UNEXPECTED ng/mg creat    Hydrocodone is almost always present in patients taking this drug    consistently. Absence of hydrocodone could be due to lapse of  time since the last dose or unusual pharmacokinetics (rapid    metabolism).   Phentermine                    Not Detected UNEXPECTED   Cyclobenzaprine                Not Detected UNEXPECTED   Zolpidem                       Not Detected UNEXPECTED    Zolpidem, as indicated in the declared medication list, is not    always detected even when used as directed.   Trazodone                      Not Detected UNEXPECTED   Hydroxyzine                    Not Detected UNEXPECTED ==================================================================== Test                      Result    Flag   Units      Ref Range   Creatinine              46               mg/dL      >=20 ==================================================================== Declared Medications:  The flagging and interpretation on this report are based on the  following declared medications.  Unexpected results may arise from  inaccuracies in the declared medications.  **Note: The testing scope of this panel includes these medications:  Cyclobenzaprine (Flexeril)  Diazepam (Valium)  Gabapentin (Neurontin)  Hydrocodone (Hydrocodone-Acetaminophen)  Hydromorphone (Dilaudid)  Hydroxyzine  Phentermine (Adipex-P)  Trazodone (Desyrel)  **Note: The testing scope of this panel does not include small to  moderate amounts of these reported medications:  Acetaminophen (Hydrocodone-Acetaminophen)  Ibuprofen  Zolpidem (Ambien)  **Note: The testing scope of this panel does not include  following  reported medications:  Amlodipine (Norvasc)  Hydrochlorothiazide (Microzide)  Insulin  Loratadine (Claritin)  Losartan (Cozaar)  Metformin (Glumetza)  Montelukast (Singulair)  Nitroglycerin  Ondansetron (Zofran)  Pramipexole (Mirapex)  Prednisone (Deltasone)  Vitamin D2 (Drisdol) ==================================================================== For clinical consultation, please call [phone]. ====================================================================     Laboratory Chemistry Profile   Renal Lab Results  Component Value Date   BUN 14 02/17/2020   CREATININE 0.72 02/17/2020   BCR 11 10/13/2016   GFR 87.16 10/09/2019   GFRAA >60 01/15/2017   GFRNONAA >60 02/17/2020     Hepatic Lab Results  Component Value Date   AST 16 10/09/2019   ALT 16 10/09/2019   ALBUMIN 3.8 10/09/2019   ALKPHOS 58 10/09/2019   HCVAB NEGATIVE 05/12/2014   LIPASE 25 01/15/2017     Electrolytes Lab Results  Component Value Date   NA 136 02/17/2020   K 3.6 02/17/2020   CL 101 02/17/2020   CALCIUM 8.8 (L) 02/17/2020   MG 1.8 10/10/2019     Bone Lab Results  Component Value Date   VD25OH 45.79 06/01/2015     Inflammation (CRP: Acute Phase) (ESR: Chronic Phase) No results found for: CRP, ESRSEDRATE, LATICACIDVEN     Note: Above Lab results reviewed.  Imaging  MR Lumbar Spine Wo Contrast CLINICAL DATA:  Low back pain, > 6 wks low back pain  EXAM: MRI LUMBAR SPINE WITHOUT CONTRAST  TECHNIQUE: Multiplanar, multisequence MR imaging of the lumbar spine was performed. No intravenous contrast  was administered.  COMPARISON:  04/16/2019 and prior.  FINDINGS: Segmentation:  Standard.  Alignment:  Normal.  Vertebrae: Vertebral body heights are preserved. Small scattered hemangiomata versus focal fat. No aggressive osseous lesion.  Conus medullaris and cauda equina: Conus extends to the L1 level. Conus and cauda equina appear normal.  Disc levels:  Multilevel desiccation and Schmorl's node formation.  L1-2: Minimal disc bulge.  Patent spinal canal and neural foramen.  L2-3: Minimal disc bulge with fall left lateral protrusion. Patent spinal canal and right neural foramen. Mild left neural foraminal narrowing.  L3-4: Minimal disc bulge.  Patent spinal canal and neural foramen.  L4-5: Disc bulge with superimposed left foraminal protrusion/annular fissuring. Anteriorly projecting 0.8 x 0.7 cm synovial cyst arising from the left facet joint effacing the subarticular recess and medially displacing the descending left L5 nerve root. Bilateral facet hypertrophy. Patent right neural foramen. Mild spinal canal and left neural foraminal narrowing.  L5-S1: No significant disc bulge. Patent spinal canal and neural foramen  Paraspinal and other soft tissues: Negative.  IMPRESSION: Left L4-5 foraminal protrusion/annular fissuring and 0.8 cm synovial cyst effacing the subarticular recess with medial displacement of the left L5 nerve root. Mild spinal canal and left neural foraminal narrowing at this level.  Mild left L2-3 neural foraminal narrowing.  Electronically Signed   By: Primitivo Gauze M.D.   On: 03/04/2020 14:53 MR CERVICAL SPINE WO CONTRAST CLINICAL DATA:  Cervical radiculopathy, prior C-spine surgery cervical spine pain  EXAM: MRI CERVICAL SPINE WITHOUT CONTRAST  TECHNIQUE: Multiplanar, multisequence MR imaging of the cervical spine was performed. No intravenous contrast was administered.  COMPARISON:  02/13/2020 and prior.  FINDINGS: Alignment: Straightening of lordosis. Sequela of C4-7 ACDF. Associated susceptibility artifact limits evaluation.  Vertebrae: Normal bone marrow signal intensity. No focal osseous lesion.  Cord: Normal signal and morphology.  Posterior Fossa, vertebral arteries: Negative.  Disc levels: Multilevel desiccation  C2-3: No significant disc bulge. Patent spinal canal and  neural foramen  C3-4: Small disc osteophyte complex with uncovertebral degenerative spurring. Patent spinal canal and neural foramen.  C4-5: Sequela of fusion.  Patent spinal canal and neural foramen.  C5-6: Sequela of fusion.  Patent spinal canal neural foramen.  C6-7: Sequela of fusion.  Patent spinal canal and neural foramen.  C7-T1: Right uncovertebral degenerative spurring. Patent spinal canal and neural foramen.  Paraspinal tissues: Negative.  IMPRESSION: Sequela of C4-7 ACDF.  No significant spinal canal or neural foraminal narrowing.  Electronically Signed   By: Primitivo Gauze M.D.   On: 03/04/2020 14:45  Assessment  The primary encounter diagnosis was Lumbar radiculopathy. Diagnoses of Radicular pain of thoracic region, Radiculopathy affecting upper extremity, and Chronic pain syndrome were also pertinent to this visit.  Plan of Care  Problem-specific:  No problem-specific Assessment & Plan notes found for this encounter.  Patricia [name] has a current medication list which includes the following long-term medication(s): fluticasone, gabapentin, loratadine, losartan, metoprolol tartrate, montelukast, pramipexole, rivaroxaban, and zolpidem.  Left L5 radiculopathy: Left lumbar epidural steroid injection ASAP. Continue with gabapentin and physical therapy exercises that patient has learned prior.  Orders:  Orders Placed This Encounter  Procedures  . Lumbar Epidural Injection    Standing Status:   Future    Standing Expiration Date:   05/23/2020    Scheduling Instructions:     Procedure: Interlaminar Lumbar Epidural Steroid injection (LESI)            Laterality: Left L4/5     Timeframe: ASAA  Order Specific Question:   Where will this procedure be performed?    Answer:   [facility]   Follow-up plan:   Return in about 6 days (around 04/29/2020) for L4/L5 ESI .   Recent Visits Date Type Provider Dept  03/12/20 Office Visit Gillis Santa, MD [facility]  Showing recent visits within past 90 days and meeting all other requirements Today's Visits Date Type Provider Dept  04/23/20 Telemedicine Gillis Santa, MD [facility]  Showing today's visits and meeting all other requirements Future Appointments No visits were found meeting these conditions. Showing future appointments within next 90 days and meeting all other requirements  I discussed the assessment and treatment plan with the patient. The patient was provided an opportunity to ask questions and all were answered. The patient agreed with the plan and demonstrated an understanding of the instructions.  Patient advised to call back or seek an in-person evaluation if the symptoms or condition worsens.  Duration of encounter: 105mnutes.  Note by: BGillis Santa MD Date: 04/23/2020; Time: 3:03 PM

## 2020-04-29 ENCOUNTER — Other Ambulatory Visit: Payer: Self-pay

## 2020-04-29 ENCOUNTER — Ambulatory Visit
Admission: RE | Admit: 2020-04-29 | Discharge: 2020-04-29 | Disposition: A | Discharge status: Home / Self Care | Payer: 59 | Source: Ambulatory Visit | Attending: Student in an Organized Health Care Education/Training Program | Admitting: Student in an Organized Health Care Education/Training Program

## 2020-04-29 ENCOUNTER — Ambulatory Visit (HOSPITAL_BASED_OUTPATIENT_CLINIC_OR_DEPARTMENT_OTHER): Payer: 59 | Admitting: Student in an Organized Health Care Education/Training Program

## 2020-04-29 ENCOUNTER — Encounter: Payer: Self-pay | Admitting: Family Medicine

## 2020-04-29 ENCOUNTER — Encounter: Payer: Self-pay | Admitting: Student in an Organized Health Care Education/Training Program

## 2020-04-29 DIAGNOSIS — M5416 Radiculopathy, lumbar region: Secondary | ICD-10-CM

## 2020-04-29 DIAGNOSIS — G894 Chronic pain syndrome: Secondary | ICD-10-CM

## 2020-04-29 MED ORDER — LIDOCAINE HCL 2 % IJ SOLN
20.0000 mL | Freq: Once | INTRAMUSCULAR | Status: AC
  Administered 2020-04-29: 400 mg

## 2020-04-29 MED ORDER — ROPIVACAINE HCL 2 MG/ML IJ SOLN
INTRAMUSCULAR | Status: AC
  Filled 2020-04-29: qty 10

## 2020-04-29 MED ORDER — SODIUM CHLORIDE 0.9% FLUSH
1.0000 mL | Freq: Once | INTRAVENOUS | Status: AC
  Administered 2020-04-29: 1 mL

## 2020-04-29 MED ORDER — IOHEXOL 180 MG/ML  SOLN
10.0000 mL | Freq: Once | INTRAMUSCULAR | Status: AC
  Administered 2020-04-29: 10 mL via EPIDURAL

## 2020-04-29 MED ORDER — DEXAMETHASONE SODIUM PHOSPHATE 10 MG/ML IJ SOLN
10.0000 mg | Freq: Once | INTRAMUSCULAR | Status: AC
  Administered 2020-04-29: 10 mg

## 2020-04-29 MED ORDER — ROPIVACAINE HCL 2 MG/ML IJ SOLN
1.0000 mL | Freq: Once | INTRAMUSCULAR | Status: AC
  Administered 2020-04-29: 1 mL via EPIDURAL

## 2020-04-29 MED ORDER — LIDOCAINE HCL 2 % IJ SOLN
INTRAMUSCULAR | Status: AC
  Filled 2020-04-29: qty 20

## 2020-04-29 MED ORDER — DEXAMETHASONE SODIUM PHOSPHATE 10 MG/ML IJ SOLN
INTRAMUSCULAR | Status: AC
  Filled 2020-04-29: qty 1

## 2020-04-29 MED ORDER — SODIUM CHLORIDE (PF) 0.9 % IJ SOLN
INTRAMUSCULAR | Status: AC
  Filled 2020-04-29: qty 10

## 2020-04-29 NOTE — Progress Notes (Deleted)
Patient's Name: Patricia [name]  MRN: [id number]  Referring Provider: Gillis Santa, MD  DOB: 18-Jul-1970  PCP: [name], MD  DOS: 04/29/2020  Note by: Gillis Santa, MD  Service setting: Ambulatory outpatient  Specialty: Interventional Pain Management  Patient type: Established  Location: [facility] (AMB) Pain Management Facility  Visit type: Interventional Procedure   Primary Reason for Visit: Interventional Pain Management Treatment. CC: Back Pain  Procedure:  Anesthesia, Analgesia, Anxiolysis:  Type: Therapeutic, Inter-Laminar, Epidural Steroid Injection #2 (#1 11/27/17) Region: Posterior Cervico-thoracic Region Level: T2/T3 Laterality: Left-Sided Paramedial  Type: Local Anesthesia Local Anesthetic: Lidocaine 1% Route: Infiltration ([place]/IM) IV Access: Secured Sedation: Meaningful verbal contact was maintained at all times during the procedure  Indication(s): Analgesia and Anxiety   Indications: 1. Lumbar radiculopathy   2. Chronic pain syndrome    Pain Score: Pre-procedure: 5 /10 Post-procedure: 5 /10  Pre-op Assessment:  Patricia [name] is a 50 y.o. (year old), female patient, seen today for interventional treatment. She  has a past surgical history that includes Abdominal hysterectomy (Dec 2012); Tonsillectomy (May 2012); transthoracic echocardiogram (04/2014); 48 hour Holter Monitor (05/04/2014); Breast lumpectomy (Right, 1991 or 1992); Breast excisional biopsy (Right, 1990); ACDL (02/06/2017); and ATRIAL FIBRILLATION ABLATION (N/A, 02/17/2020). Patricia [name] has a current medication list which includes the following prescription(s): acetaminophen, alprazolam, biotin, vitamin d, cyclobenzaprine, fluticasone, gabapentin, loratadine, losartan, metoprolol tartrate, montelukast, omeprazole, OVER THE COUNTER MEDICATION, pramipexole, zolpidem, prednisone, and rivaroxaban. Her primarily concern today is the Back Pain  Initial Vital Signs: Blood pressure 134/89, pulse 99, temperature (!)  97.2 F (36.2 C), resp. rate 16, height 5\' 9"  (1.753 m), weight 216 lb (98 kg), SpO2 98 %. BMI: Estimated body mass index is 33.82 kg/m as calculated from the following:   Height as of this encounter: 5\' 9"  (1.753 m).   Weight as of this encounter: 229 lb (103.9 kg).  Risk Assessment: Allergies: Reviewed. She is allergic to venlafaxine, tramadol, clarithromycin, and zithromax [azithromycin].  Allergy Precautions: None required Coagulopathies: Reviewed. None identified.  Blood-thinner therapy: None at this time Active Infection(s): Reviewed. None identified. Patricia [name] is afebrile  Site Confirmation: Patricia [name] was asked to confirm the procedure and laterality before marking the site Procedure checklist: Completed Consent: Before the procedure and under the influence of no sedative(s), amnesic(s), or anxiolytics, the patient was informed of the treatment options, risks and possible complications. To fulfill our ethical and legal obligations, as recommended by the American Medical Association's Code of Ethics, I have informed the patient of my clinical impression; the nature and purpose of the treatment or procedure; the risks, benefits, and possible complications of the intervention; the alternatives, including doing nothing; the risk(s) and benefit(s) of the alternative treatment(s) or procedure(s); and the risk(s) and benefit(s) of doing nothing. The patient was provided information about the general risks and possible complications associated with the procedure. These may include, but are not limited to: failure to achieve desired goals, infection, bleeding, organ or nerve damage, allergic reactions, paralysis, and death. In addition, the patient was informed of those risks and complications associated to Spine-related procedures, such as failure to decrease pain; infection (i.e.: Meningitis, epidural or intraspinal abscess); bleeding (i.e.: epidural hematoma, subarachnoid hemorrhage, or any  other type of intraspinal or peri-dural bleeding); organ or nerve damage (i.e.: Any type of peripheral nerve, nerve root, or spinal cord injury) with subsequent damage to sensory, motor, and/or autonomic systems, resulting in permanent pain, numbness, and/or weakness of one or several areas of the body; allergic  reactions; (i.e.: anaphylactic reaction); and/or death. Furthermore, the patient was informed of those risks and complications associated with the medications. These include, but are not limited to: allergic reactions (i.e.: anaphylactic or anaphylactoid reaction(s)); adrenal axis suppression; blood sugar elevation that in diabetics may result in ketoacidosis or comma; water retention that in patients with history of congestive heart failure may result in shortness of breath, pulmonary edema, and decompensation with resultant heart failure; weight gain; swelling or edema; medication-induced neural toxicity; particulate matter embolism and blood vessel occlusion with resultant organ, and/or nervous system infarction; and/or aseptic necrosis of one or more joints. Finally, the patient was informed that Medicine is not an exact science; therefore, there is also the possibility of unforeseen or unpredictable risks and/or possible complications that may result in a catastrophic outcome. The patient indicated having understood very clearly. We have given the patient no guarantees and we have made no promises. Enough time was given to the patient to ask questions, all of which were answered to the patient's satisfaction. Patricia [name] has indicated that she wanted to continue with the procedure. Attestation: I, the ordering provider, attest that I have discussed with the patient the benefits, risks, side-effects, alternatives, likelihood of achieving goals, and potential problems during recovery for the procedure that I have provided informed consent. Date: 04/29/2020; Time: 2:43 PM  Pre-Procedure Preparation:   Monitoring: As per clinic protocol. Respiration, ETCO2, SpO2, BP, heart rate and rhythm monitor placed and checked for adequate function Safety Precautions: Patient was assessed for positional comfort and pressure points before starting the procedure. Time-out: I initiated and conducted the "Time-out" before starting the procedure, as per protocol. The patient was asked to participate by confirming the accuracy of the "Time Out" information. Verification of the correct person, site, and procedure were performed and confirmed by me, the nursing staff, and the patient. "Time-out" conducted as per Joint Commission's Universal Protocol (UP.01.01.01). "Time-out" Date & Time: 04/29/2020;   hrs.  Description of Procedure Process:   Position: Prone with head of the table was raised to facilitate breathing. Target Area: For Epidural Steroid injections the target is the interlaminar space, initially targeting the lower border of the superior vertebral body lamina. Approach: Paramedial approach. Area Prepped: Entire PosteriorCervical Region Prepping solution: ChloraPrep (2% chlorhexidine gluconate and 70% isopropyl alcohol) Safety Precautions: Aspiration looking for blood return was conducted prior to all injections. At no point did we inject any substances, as a needle was being advanced. No attempts were made at seeking any paresthesias. Safe injection practices and needle disposal techniques used. Medications properly checked for expiration dates. SDV (single dose vial) medications used. Description of the Procedure: Protocol guidelines were followed. The procedure needle was introduced through the skin, ipsilateral to the reported pain, and advanced to the target area. Bone was contacted and the needle walked caudad, until the lamina was cleared. The epidural space was identified using "loss-of-resistance technique" with 2-3 ml of PF-NaCl (0.9% NSS), in a 5cc LOR glass syringe. Vitals:   04/29/20 0847  BP:  135/86  Pulse: 88  Temp: (!) 97 F (36.1 C)  SpO2: 99%  Weight: 229 lb (103.9 kg)  Height: 5\' 9"  (1.753 m)    Start Time:   hrs. End Time:   hrs. Materials:  Needle(s) Type: Epidural needle Gauge: 22G Length: 3.5-in Medication(s): Patricia [name] had no medications administered during this visit. Please see chart orders for dosing details. 4 cc solution made of 2 cc of normal saline preservative-free, 1 cc of  0.2% ropivacaine, 1 cc of Decadron 10 mg/cc. Imaging Guidance (Spinal):  Type of Imaging Technique: Fluoroscopy Guidance (Spinal) Indication(s): Assistance in needle guidance and placement for procedures requiring needle placement in or near specific anatomical locations not easily accessible without such assistance. Exposure Time: Please see nurses notes. Contrast: Before injecting any contrast, we confirmed that the patient did not have an allergy to iodine, shellfish, or radiological contrast. Once satisfactory needle placement was completed at the desired level, radiological contrast was injected. Contrast injected under live fluoroscopy. No contrast complications. See chart for type and volume of contrast used. Fluoroscopic Guidance: I was personally present during the use of fluoroscopy. "Tunnel Vision Technique" used to obtain the best possible view of the target area. Parallax error corrected before commencing the procedure. "Direction-depth-direction" technique used to introduce the needle under continuous pulsed fluoroscopy. Once target was reached, antero-posterior, oblique, and lateral fluoroscopic projection used confirm needle placement in all planes. Images permanently stored in EMR. Interpretation: I personally interpreted the imaging intraoperatively. Adequate needle placement confirmed in multiple planes. Appropriate spread of contrast into desired area was observed. No evidence of afferent or efferent intravascular uptake. No intrathecal or subarachnoid spread  observed. Permanent images saved into the patient's record.  Antibiotic Prophylaxis:  Indication(s): None identified Antibiotic given: None  Post-operative Assessment:  EBL: None Complications: No immediate post-treatment complications observed by team, or reported by patient. Note: The patient tolerated the entire procedure well. A repeat set of vitals were taken after the procedure and the patient was kept under observation following institutional policy, for this type of procedure. Post-procedural neurological assessment was performed, showing return to baseline, prior to discharge. The patient was provided with post-procedure discharge instructions, including a section on how to identify potential problems. Should any problems arise concerning this procedure, the patient was given instructions to immediately contact us, at any time, without hesitation. In any case, we plan to contact the patient by telephone for a follow-up status report regarding this interventional procedure. Comments:  No additional relevant information. 5 out of 5 left upper extremity strength: Shoulder abduction, elbow flexion, elbow extension, thumb extension (at baseline) 5 out of 5 strength right upper extremity: Shoulder abduction, elbow flexion, elbow extension, thumb extension.  (At baseline) Plan of Care   Imaging Orders  No imaging studies ordered today   Procedure Orders    No procedure(s) ordered today    Continue meds as previously prescribed, increase Gabapentin to 300/300/600  Medications ordered for procedure: No orders of the defined types were placed in this encounter.  Medications administered: Patricia [name] had no medications administered during this visit.  See the medical record for exact dosing, route, and time of administration.  New Prescriptions   No medications on file   Disposition: Discharge home  Discharge Date & Time: 04/29/2020;   hrs.   Physician-requested Follow-up: No  follow-ups on file. Future Appointments  Date Time Provider [place]  05/04/2020  2:00 PM [name], DO [facility] None  05/20/2020 10:40 AM Vickie Epley, MD [facility]   Primary Care Physician: [name], MD Location: [facility] Note by: Gillis Santa, MD Date: 04/29/2020; Time: 8:58 AM  Disclaimer:  Medicine is not an exact science. The only guarantee in medicine is that nothing is guaranteed. It is important to note that the decision to proceed with this intervention was based on the information collected from the patient. The Data and conclusions were drawn from the patient's questionnaire,  the interview, and the physical examination. Because the information was provided in large part by the patient, it cannot be guaranteed that it has not been purposely or unconsciously manipulated. Every effort has been made to obtain as much relevant data as possible for this evaluation. It is important to note that the conclusions that lead to this procedure are derived in large part from the available data. Always take into account that the treatment will also be dependent on availability of resources and existing treatment guidelines, considered by other Pain Management Practitioners as being common knowledge and practice, at the time of the intervention. For Medico-Legal purposes, it is also important to point out that variation in procedural techniques and pharmacological choices are the acceptable norm. The indications, contraindications, technique, and results of the above procedure should only be interpreted and judged by a Board-Certified Interventional Pain Specialist with extensive familiarity and expertise in the same exact procedure and technique.

## 2020-04-29 NOTE — Patient Instructions (Signed)
Pain Management Discharge Instructions  General Discharge Instructions :  If you need to reach your doctor call: Monday-Friday 8:00 am - 4:00 pm at [phone] or toll free [phone].  After clinic hours [phone] to have operator reach doctor.  Bring all of your medication bottles to all your appointments in the pain clinic.  To cancel or reschedule your appointment with Pain Management please remember to call 24 hours in advance to avoid a fee.  Refer to the educational materials which you have been given on: General Risks, I had my Procedure. Discharge Instructions, Post Sedation.  Post Procedure Instructions:  The drugs you were given will stay in your system until tomorrow, so for the next 24 hours you should not drive, make any legal decisions or drink any alcoholic beverages.  You may eat anything you prefer, but it is better to start with liquids then soups and crackers, and gradually work up to solid foods.  Please notify your doctor immediately if you have any unusual bleeding, trouble breathing or pain that is not related to your normal pain.  Depending on the type of procedure that was done, some parts of your body may feel week and/or numb.  This usually clears up by tonight or the next day.  Walk with the use of an assistive device or accompanied by an adult for the 24 hours.  You may use ice on the affected area for the first 24 hours.  Put ice in a Ziploc bag and cover with a towel and place against area 15 minutes on 15 minutes off.  You may switch to heat after 24 hours.Epidural Steroid Injection Patient Information  Description: The epidural space surrounds the nerves as they exit the spinal cord.  In some patients, the nerves can be compressed and inflamed by a bulging disc or a tight spinal canal (spinal stenosis).  By injecting steroids into the epidural space, we can bring irritated nerves into direct contact with a potentially helpful medication.  These  steroids act directly on the irritated nerves and can reduce swelling and inflammation which often leads to decreased pain.  Epidural steroids may be injected anywhere along the spine and from the neck to the low back depending upon the location of your pain.   After numbing the skin with local anesthetic (like Novocaine), a small needle is passed into the epidural space slowly.  You may experience a sensation of pressure while this is being done.  The entire block usually last less than 10 minutes.  Conditions which may be treated by epidural steroids:   Low back and leg pain  Neck and arm pain  Spinal stenosis  Post-laminectomy syndrome  Herpes zoster (shingles) pain  Pain from compression fractures  Preparation for the injection:  1. Do not eat any solid food or dairy products within 8 hours of your appointment.  2. You may drink clear liquids up to 3 hours before appointment.  Clear liquids include water, black coffee, juice or soda.  No milk or cream please. 3. You may take your regular medication, including pain medications, with a sip of water before your appointment  Diabetics should hold regular insulin (if taken separately) and take 1/2 normal NPH dos the morning of the procedure.  Carry some sugar containing items with you to your appointment. 4. A driver must accompany you and be prepared to drive you home after your procedure.  5. Bring all your current medications with your. 6. An IV may be inserted and   sedation may be given at the discretion of the physician.   7. A blood pressure cuff, EKG and other monitors will often be applied during the procedure.  Some patients may need to have extra oxygen administered for a short period. 8. You will be asked to provide medical information, including your allergies, prior to the procedure.  We must know immediately if you are taking blood thinners (like Coumadin/Warfarin)  Or if you are allergic to IV iodine contrast (dye). We must  know if you could possible be pregnant.  Possible side-effects:  Bleeding from needle site  Infection (rare, may require surgery)  Nerve injury (rare)  Numbness & tingling (temporary)  Difficulty urinating (rare, temporary)  Spinal headache ( a headache worse with upright posture)  Light -headedness (temporary)  Pain at injection site (several days)  Decreased blood pressure (temporary)  Weakness in arm/leg (temporary)  Pressure sensation in back/neck (temporary)  Call if you experience:  Fever/chills associated with headache or increased back/neck pain.  Headache worsened by an upright position.  New onset weakness or numbness of an extremity below the injection site  Hives or difficulty breathing (go to the emergency room)  Inflammation or drainage at the infection site  Severe back/neck pain  Any new symptoms which are concerning to you  Please note:  Although the local anesthetic injected can often make your back or neck feel good for several hours after the injection, the pain will likely return.  It takes 3-7 days for steroids to work in the epidural space.  You may not notice any pain relief for at least that one week.  If effective, we will often do a series of three injections spaced 3-6 weeks apart to maximally decrease your pain.  After the initial series, we generally will wait several months before considering a repeat injection of the same type.  If you have any questions, please call [phone] [facility] 

## 2020-04-29 NOTE — Progress Notes (Signed)
Safety precautions to be maintained throughout the outpatient stay will include: orient to surroundings, keep bed in low position, maintain call bell within reach at all times, provide assistance with transfer out of bed and ambulation.  

## 2020-04-29 NOTE — Progress Notes (Signed)
PROVIDER NOTE: Information contained herein reflects review and annotations entered in association with encounter. Interpretation of such information and data should be left to medically-trained personnel. Information provided to patient can be located elsewhere in the medical record under "Patient Instructions". Document created using STT-dictation technology, any transcriptional errors that may result from process are unintentional.    Patient: Patricia [name]  Service Category: Procedure  Provider: Gillis Santa, MD  DOB: 1970/11/27  DOS: 04/29/2020  Location: [facility]  MRN: [id number]  Setting: Ambulatory - outpatient  Referring Provider: Gillis Santa, MD  Type: Established Patient  Specialty: Interventional Pain Management  PCP: [name], MD   Primary Reason for Visit: Interventional Pain Management Treatment. CC: Back Pain  Procedure:          Anesthesia, Analgesia, Anxiolysis:  Type: Diagnostic Inter-Laminar Epidural Steroid Injection  #1  Region: Lumbar Level: L4-5 Level. Laterality: Left-Sided         Type: Local Anesthesia  Local Anesthetic: Lidocaine 1-2%  Position: Prone with head of the table was raised to facilitate breathing.   Indications: 1. Lumbar radiculopathy   2. Chronic pain syndrome    Pain Score: Pre-procedure: 5 /10 Post-procedure: 5  (improvement with left buttock shooting pain.)/10    Worsening left lumbar radicular pain that radiates down her left lateral thigh down to her calf.  Lumbar MRI shows left foraminal protrusion at L4-L5 with also a 0.8 x 0.7 cm synovial cyst along the left facet joint effacing the subarticular recess and perhaps displacing the left L5 nerve root.  We will plan for left L4-L5 interlaminar steroid injection.  Pre-op H&P Assessment:  Patricia [name] is a 50 y.o. (year old), female patient, seen today for interventional treatment. She  has a past surgical history that includes Abdominal hysterectomy  (Dec 2012); Tonsillectomy (May 2012); transthoracic echocardiogram (04/2014); 48 hour Holter Monitor (05/04/2014); Breast lumpectomy (Right, 1991 or 1992); Breast excisional biopsy (Right, 1990); ACDL (02/06/2017); and ATRIAL FIBRILLATION ABLATION (N/A, 02/17/2020). Patricia [name] has a current medication list which includes the following prescription(s): acetaminophen, alprazolam, biotin, vitamin d, cyclobenzaprine, fluticasone, gabapentin, loratadine, losartan, metoprolol tartrate, montelukast, omeprazole, OVER THE COUNTER MEDICATION, pramipexole, zolpidem, prednisone, and rivaroxaban. Her primarily concern today is the Back Pain  Initial Vital Signs:  Pulse/HCG Rate: 88ECG Heart Rate: 88 Temp: (!) 97 F (36.1 C) Resp: 18 BP: 135/86 SpO2: 99 %  BMI: Estimated body mass index is 33.82 kg/m as calculated from the following:   Height as of this encounter: 5\' 9"  (1.753 m).   Weight as of this encounter: 229 lb (103.9 kg).  Risk Assessment: Allergies: Reviewed. She is allergic to venlafaxine, tramadol, clarithromycin, and zithromax [azithromycin].  Allergy Precautions: None required Coagulopathies: Reviewed. None identified.  Blood-thinner therapy: None at this time Active Infection(s): Reviewed. None identified. Patricia [name] is afebrile  Site Confirmation: Patricia [name] was asked to confirm the procedure and laterality before marking the site Procedure checklist: Completed Consent: Before the procedure and under the influence of no sedative(s), amnesic(s), or anxiolytics, the patient was informed of the treatment options, risks and possible complications. To fulfill our ethical and legal obligations, as recommended by the American Medical Association's Code of Ethics, I have informed the patient of my clinical impression; the nature and purpose of the treatment or procedure; the risks, benefits, and possible complications of the intervention; the alternatives, including doing nothing; the risk(s) and  benefit(s) of the alternative treatment(s) or procedure(s); and the risk(s) and benefit(s) of doing nothing.  The patient was provided information about the general risks and possible complications associated with the procedure. These may include, but are not limited to: failure to achieve desired goals, infection, bleeding, organ or nerve damage, allergic reactions, paralysis, and death. In addition, the patient was informed of those risks and complications associated to Spine-related procedures, such as failure to decrease pain; infection (i.e.: Meningitis, epidural or intraspinal abscess); bleeding (i.e.: epidural hematoma, subarachnoid hemorrhage, or any other type of intraspinal or peri-dural bleeding); organ or nerve damage (i.e.: Any type of peripheral nerve, nerve root, or spinal cord injury) with subsequent damage to sensory, motor, and/or autonomic systems, resulting in permanent pain, numbness, and/or weakness of one or several areas of the body; allergic reactions; (i.e.: anaphylactic reaction); and/or death. Furthermore, the patient was informed of those risks and complications associated with the medications. These include, but are not limited to: allergic reactions (i.e.: anaphylactic or anaphylactoid reaction(s)); adrenal axis suppression; blood sugar elevation that in diabetics may result in ketoacidosis or comma; water retention that in patients with history of congestive heart failure may result in shortness of breath, pulmonary edema, and decompensation with resultant heart failure; weight gain; swelling or edema; medication-induced neural toxicity; particulate matter embolism and blood vessel occlusion with resultant organ, and/or nervous system infarction; and/or aseptic necrosis of one or more joints. Finally, the patient was informed that Medicine is not an exact science; therefore, there is also the possibility of unforeseen or unpredictable risks and/or possible complications that may  result in a catastrophic outcome. The patient indicated having understood very clearly. We have given the patient no guarantees and we have made no promises. Enough time was given to the patient to ask questions, all of which were answered to the patient's satisfaction. Patricia [name] has indicated that she wanted to continue with the procedure. Attestation: I, the ordering provider, attest that I have discussed with the patient the benefits, risks, side-effects, alternatives, likelihood of achieving goals, and potential problems during recovery for the procedure that I have provided informed consent. Date  Time: 04/29/2020  8:46 AM  Pre-Procedure Preparation:  Monitoring: As per clinic protocol. Respiration, ETCO2, SpO2, BP, heart rate and rhythm monitor placed and checked for adequate function Safety Precautions: Patient was assessed for positional comfort and pressure points before starting the procedure. Time-out: I initiated and conducted the "Time-out" before starting the procedure, as per protocol. The patient was asked to participate by confirming the accuracy of the "Time Out" information. Verification of the correct person, site, and procedure were performed and confirmed by me, the nursing staff, and the patient. "Time-out" conducted as per Joint Commission's Universal Protocol (UP.01.01.01). Time: 0909  Description of Procedure:          Target Area: The interlaminar space, initially targeting the lower laminar border of the superior vertebral body. Approach: Paramedial approach. Area Prepped: Entire Posterior Lumbar Region DuraPrep (Iodine Povacrylex [0.7% available iodine] and Isopropyl Alcohol, 74% w/w) Safety Precautions: Aspiration looking for blood return was conducted prior to all injections. At no point did we inject any substances, as a needle was being advanced. No attempts were made at seeking any paresthesias. Safe injection practices and needle disposal techniques used.  Medications properly checked for expiration dates. SDV (single dose vial) medications used. Description of the Procedure: Protocol guidelines were followed. The procedure needle was introduced through the skin, ipsilateral to the reported pain, and advanced to the target area. Bone was contacted and the needle walked caudad, until the lamina was cleared.  The epidural space was identified using "loss-of-resistance technique" with 2-3 ml of PF-NaCl (0.9% NSS), in a 5cc LOR glass syringe.  Vitals:   04/29/20 0847 04/29/20 0910 04/29/20 0915  BP: 135/86 (!) 148/106 (!) 149/112  Pulse: 88    Resp:  18 16  Temp: (!) 97 F (36.1 C)    SpO2: 99% 98% 100%  Weight: 229 lb (103.9 kg)    Height: 5\' 9"  (1.753 m)      Start Time: 0909 hrs. End Time: 0917 hrs.  Materials:  Needle(s) Type: Epidural needle Gauge: 17G Length: 3.5-in Medication(s): Please see orders for medications and dosing details. 6 cc solution made of 3 cc of preservative-free saline, 2 cc of 0.2% ropivacaine, 1 cc of Decadron 10 mg/cc.  Imaging Guidance (Spinal):          Type of Imaging Technique: Fluoroscopy Guidance (Spinal) Indication(s): Assistance in needle guidance and placement for procedures requiring needle placement in or near specific anatomical locations not easily accessible without such assistance. Exposure Time: Please see nurses notes. Contrast: Before injecting any contrast, we confirmed that the patient did not have an allergy to iodine, shellfish, or radiological contrast. Once satisfactory needle placement was completed at the desired level, radiological contrast was injected. Contrast injected under live fluoroscopy. No contrast complications. See chart for type and volume of contrast used. Fluoroscopic Guidance: I was personally present during the use of fluoroscopy. "Tunnel Vision Technique" used to obtain the best possible view of the target area. Parallax error corrected before commencing the procedure.  "Direction-depth-direction" technique used to introduce the needle under continuous pulsed fluoroscopy. Once target was reached, antero-posterior, oblique, and lateral fluoroscopic projection used confirm needle placement in all planes. Images permanently stored in EMR. Interpretation: I personally interpreted the imaging intraoperatively. Adequate needle placement confirmed in multiple planes. Appropriate spread of contrast into desired area was observed. No evidence of afferent or efferent intravascular uptake. No intrathecal or subarachnoid spread observed. Permanent images saved into the patient's record.  Post-operative Assessment:  Post-procedure Vital Signs:  Pulse/HCG Rate: 8884 Temp: (!) 97 F (36.1 C) Resp: 16 BP: (!) 149/112 SpO2: 100 %  EBL: None  Complications: No immediate post-treatment complications observed by team, or reported by patient.  Note: The patient tolerated the entire procedure well. A repeat set of vitals were taken after the procedure and the patient was kept under observation following institutional policy, for this type of procedure. Post-procedural neurological assessment was performed, showing return to baseline, prior to discharge. The patient was provided with post-procedure discharge instructions, including a section on how to identify potential problems. Should any problems arise concerning this procedure, the patient was given instructions to immediately contact us, at any time, without hesitation. In any case, we plan to contact the patient by telephone for a follow-up status report regarding this interventional procedure.  Comments:  No additional relevant information.  5 out of 5 strength bilateral lower extremity: Plantar flexion, dorsiflexion, knee flexion, knee extension. No headaches  Plan of Care  Orders:  Orders Placed This Encounter  Procedures  . DG [facility] C-ARM 1-60 MIN NO REPORT    Intraoperative interpretation by procedural  physician at [facility].    Standing Status:   Standing    Number of Occurrences:   1    Order Specific Question:   Reason for exam:    Answer:   Assistance in needle guidance and placement for procedures requiring needle placement in or near specific anatomical locations not  easily accessible without such assistance.   Medications ordered for procedure: Meds ordered this encounter  Medications  . iohexol (OMNIPAQUE) 180 MG/ML injection 10 mL    Must be Myelogram-compatible. If not available, you may substitute with a water-soluble, non-ionic, hypoallergenic, myelogram-compatible radiological contrast medium.  Marland Kitchen lidocaine (XYLOCAINE) 2 % (with pres) injection 400 mg  . sodium chloride flush (NS) 0.9 % injection 1 mL  . ropivacaine (PF) 2 mg/mL (0.2%) (NAROPIN) injection 1 mL  . dexamethasone (DECADRON) injection 10 mg   Medications administered: We administered iohexol, lidocaine, sodium chloride flush, ropivacaine (PF) 2 mg/mL (0.2%), and dexamethasone.  See the medical record for exact dosing, route, and time of administration.  Follow-up plan:   Return in about 3 weeks (around 05/20/2020) for Post Procedure Evaluation, virtual.    Recent Visits Date Type Provider Dept  04/23/20 Telemedicine Gillis Santa, MD [facility]  03/12/20 Office Visit Gillis Santa, MD [facility]  Showing recent visits within past 90 days and meeting all other requirements Today's Visits Date Type Provider Dept  04/29/20 Procedure visit Gillis Santa, MD [facility]  Showing today's visits and meeting all other requirements Future Appointments Date Type Provider Dept  05/20/20 Appointment Gillis Santa, MD [facility]  Showing future appointments within next 90 days and meeting all other requirements  Disposition: Discharge home  Discharge (Date  Time): 04/29/2020; 0928 hrs.   Primary Care Physician: [name], MD Location: [facility] Note by: Gillis Santa, MD Date: 04/29/2020; Time: 9:33 AM  Disclaimer:  Medicine is not an exact science. The only guarantee in medicine is that nothing is guaranteed. It is important to note that the decision to proceed with this intervention was based on the information collected from the patient. The Data and conclusions were drawn from the patient's questionnaire, the interview, and the physical examination. Because the information was provided in large part by the patient, it cannot be guaranteed that it has not been purposely or unconsciously manipulated. Every effort has been made to obtain as much relevant data as possible for this evaluation. It is important to note that the conclusions that lead to this procedure are derived in large part from the available data. Always take into account that the treatment will also be dependent on availability of resources and existing treatment guidelines, considered by other Pain Management Practitioners as being common knowledge and practice, at the time of the intervention. For Medico-Legal purposes, it is also important to point out that variation in procedural techniques and pharmacological choices are the acceptable norm. The indications, contraindications, technique, and results of the above procedure should only be interpreted and judged by a Board-Certified Interventional Pain Specialist with extensive familiarity and expertise in the same exact procedure and technique.

## 2020-04-30 ENCOUNTER — Telehealth: Payer: Self-pay | Admitting: *Deleted

## 2020-04-30 MED ORDER — METOPROLOL TARTRATE 25 MG PO TABS: 25.0000 mg | ORAL_TABLET | Freq: Two times a day (BID) | ORAL | 2 refills | Status: DC

## 2020-04-30 MED ORDER — LOSARTAN POTASSIUM 100 MG PO TABS: 100.0000 mg | ORAL_TABLET | Freq: Every day | ORAL | 3 refills | Status: DC

## 2020-04-30 NOTE — Telephone Encounter (Signed)
Medication has been canceled.

## 2020-04-30 NOTE — Telephone Encounter (Signed)
No problems post procedure. 

## 2020-04-30 NOTE — Telephone Encounter (Signed)
Please call [facility] and cancel the metoprolol refill thanks

## 2020-05-04 ENCOUNTER — Ambulatory Visit: Payer: 59 | Admitting: Family Medicine

## 2020-05-19 ENCOUNTER — Telehealth: Payer: Self-pay | Admitting: *Deleted

## 2020-05-19 NOTE — Telephone Encounter (Signed)
Attempted to call for pre appointment review of allergies/meds. Message left. 

## 2020-05-20 ENCOUNTER — Encounter: Payer: Self-pay | Admitting: Cardiology

## 2020-05-20 ENCOUNTER — Telehealth: Payer: Self-pay

## 2020-05-20 ENCOUNTER — Ambulatory Visit (INDEPENDENT_AMBULATORY_CARE_PROVIDER_SITE_OTHER): Payer: 59 | Admitting: Cardiology

## 2020-05-20 ENCOUNTER — Encounter: Payer: Self-pay | Admitting: Student in an Organized Health Care Education/Training Program

## 2020-05-20 ENCOUNTER — Ambulatory Visit
Payer: 59 | Attending: Student in an Organized Health Care Education/Training Program | Admitting: Student in an Organized Health Care Education/Training Program

## 2020-05-20 ENCOUNTER — Other Ambulatory Visit: Payer: Self-pay

## 2020-05-20 VITALS — BP 148/98 | HR 94 | Ht 69.0 in | Wt 238.0 lb

## 2020-05-20 DIAGNOSIS — M5136 Other intervertebral disc degeneration, lumbar region: Secondary | ICD-10-CM

## 2020-05-20 DIAGNOSIS — I4892 Unspecified atrial flutter: Secondary | ICD-10-CM | POA: Diagnosis not present

## 2020-05-20 DIAGNOSIS — G894 Chronic pain syndrome: Secondary | ICD-10-CM

## 2020-05-20 DIAGNOSIS — I10 Essential (primary) hypertension: Secondary | ICD-10-CM

## 2020-05-20 DIAGNOSIS — M5410 Radiculopathy, site unspecified: Secondary | ICD-10-CM

## 2020-05-20 DIAGNOSIS — F32A Depression, unspecified: Secondary | ICD-10-CM

## 2020-05-20 DIAGNOSIS — E6609 Other obesity due to excess calories: Secondary | ICD-10-CM

## 2020-05-20 DIAGNOSIS — M5414 Radiculopathy, thoracic region: Secondary | ICD-10-CM | POA: Diagnosis not present

## 2020-05-20 DIAGNOSIS — I480 Paroxysmal atrial fibrillation: Secondary | ICD-10-CM

## 2020-05-20 DIAGNOSIS — M5416 Radiculopathy, lumbar region: Secondary | ICD-10-CM | POA: Diagnosis not present

## 2020-05-20 DIAGNOSIS — Z6835 Body mass index (BMI) 35.0-35.9, adult: Secondary | ICD-10-CM
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
## 2020-05-20 NOTE — Patient Instructions (Signed)
Medication Instructions:  Your physician recommends that you continue on your current medications as directed. Please refer to the Current Medication list given to you today. *If you need a refill on your cardiac medications before your next appointment, please call your pharmacy*  Lab Work: None ordered. If you have labs (blood work) drawn today and your tests are completely normal, you will receive your results only by: . MyChart Message (if you have MyChart) OR . A paper copy in the mail If you have any lab test that is abnormal or we need to change your treatment, we will call you to review the results.  Testing/Procedures: None ordered.  Follow-Up: At [facility], you and your health needs are our priority.  As part of our continuing mission to provide you with exceptional heart care, we have created designated Provider Care Teams.  These Care Teams include your primary Cardiologist (physician) and Advanced Practice Providers (APPs -  Physician Assistants and Nurse Practitioners) who all work together to provide you with the care you need, when you need it.  Your next appointment:   Your physician wants you to follow-up in: one year with Dr. Lambert.   You will receive a reminder letter in the mail two months in advance. If you don't receive a letter, please call our office to schedule the follow-up appointment.    

## 2020-05-20 NOTE — Telephone Encounter (Signed)
LM for patient to call office for pre virtual appointment questions.  

## 2020-05-24 MED ORDER — BUPROPION HCL ER (XL) 150 MG PO TB24: 1.0000 | ORAL_TABLET | Freq: Every day | ORAL | 3 refills | Status: DC

## 2020-06-03 ENCOUNTER — Other Ambulatory Visit: Payer: Self-pay

## 2020-06-03 ENCOUNTER — Ambulatory Visit (HOSPITAL_BASED_OUTPATIENT_CLINIC_OR_DEPARTMENT_OTHER): Payer: 59 | Admitting: Student in an Organized Health Care Education/Training Program

## 2020-06-03 ENCOUNTER — Encounter: Payer: Self-pay | Admitting: Student in an Organized Health Care Education/Training Program

## 2020-06-03 ENCOUNTER — Ambulatory Visit
Admission: RE | Admit: 2020-06-03 | Discharge: 2020-06-03 | Disposition: A | Discharge status: Home / Self Care | Payer: 59 | Source: Ambulatory Visit | Attending: Student in an Organized Health Care Education/Training Program | Admitting: Student in an Organized Health Care Education/Training Program

## 2020-06-03 VITALS — BP 151/102 | HR 93 | Resp 22 | Ht 69.0 in | Wt 238.0 lb

## 2020-06-03 DIAGNOSIS — M5414 Radiculopathy, thoracic region: Secondary | ICD-10-CM | POA: Insufficient documentation

## 2020-06-03 DIAGNOSIS — M5410 Radiculopathy, site unspecified: Secondary | ICD-10-CM | POA: Insufficient documentation

## 2020-06-03 DIAGNOSIS — G894 Chronic pain syndrome: Secondary | ICD-10-CM | POA: Insufficient documentation

## 2020-06-03 MED ORDER — SODIUM CHLORIDE 0.9% FLUSH
1.0000 mL | Freq: Once | INTRAVENOUS | Status: AC
  Administered 2020-06-03: 1 mL

## 2020-06-03 MED ORDER — IOHEXOL 180 MG/ML  SOLN
10.0000 mL | Freq: Once | INTRAMUSCULAR | Status: AC
  Administered 2020-06-03: 10 mL via EPIDURAL

## 2020-06-03 MED ORDER — LIDOCAINE HCL 2 % IJ SOLN
INTRAMUSCULAR | Status: AC
  Filled 2020-06-03: qty 20

## 2020-06-03 MED ORDER — ROPIVACAINE HCL 2 MG/ML IJ SOLN
1.0000 mL | Freq: Once | INTRAMUSCULAR | Status: AC
  Administered 2020-06-03: 1 mL via EPIDURAL

## 2020-06-03 MED ORDER — LIDOCAINE HCL 2 % IJ SOLN
20.0000 mL | Freq: Once | INTRAMUSCULAR | Status: AC
  Administered 2020-06-03: 400 mg

## 2020-06-03 MED ORDER — SODIUM CHLORIDE (PF) 0.9 % IJ SOLN
INTRAMUSCULAR | Status: AC
  Filled 2020-06-03: qty 10

## 2020-06-03 MED ORDER — DEXAMETHASONE SODIUM PHOSPHATE 10 MG/ML IJ SOLN
INTRAMUSCULAR | Status: AC
  Filled 2020-06-03: qty 1

## 2020-06-03 MED ORDER — ROPIVACAINE HCL 2 MG/ML IJ SOLN
INTRAMUSCULAR | Status: AC
  Filled 2020-06-03: qty 10

## 2020-06-03 MED ORDER — IOHEXOL 180 MG/ML  SOLN
INTRAMUSCULAR | Status: AC
  Filled 2020-06-03: qty 20

## 2020-06-03 MED ORDER — DEXAMETHASONE SODIUM PHOSPHATE 10 MG/ML IJ SOLN
10.0000 mg | Freq: Once | INTRAMUSCULAR | Status: AC
  Administered 2020-06-03: 10 mg

## 2020-06-03 NOTE — Progress Notes (Signed)
Safety precautions to be maintained throughout the outpatient stay will include: orient to surroundings, keep bed in low position, maintain call bell within reach at all times, provide assistance with transfer out of bed and ambulation.  

## 2020-06-03 NOTE — Progress Notes (Signed)
Patient's Name: Patricia [name]  MRN: [id number]  Referring Provider: [name], MD  DOB: Jan 06, 1971  PCP: [name], MD  DOS: 06/03/2020  Note by: Gillis Santa, MD  Service setting: Ambulatory outpatient  Specialty: Interventional Pain Management  Patient type: Established  Location: [facility] (AMB) Pain Management Facility  Visit type: Interventional Procedure   Primary Reason for Visit: Interventional Pain Management Treatment. CC: No chief complaint on file.  Procedure:  Anesthesia, Analgesia, Anxiolysis:  Type: Therapeutic, Inter-Laminar, Epidural Steroid Injection Region: Posterior Cervico-thoracic Region Level: T1/T2 Laterality: Left-Sided Paramedial  Type: Local Anesthesia Local Anesthetic: Lidocaine 1% Route: Infiltration (Junction/IM) IV Access: Secured Sedation: Meaningful verbal contact was maintained at all times during the procedure  Indication(s): Analgesia and Anxiety   Indications: 1. Radicular pain of thoracic region   2. Radiculopathy affecting upper extremity   3. Chronic pain syndrome    Pain Score: Pre-procedure: 6 /10 Post-procedure: 2 /10   Pre-op Assessment:  Patricia [name] is a 50 y.o. (year old), female patient, seen today for interventional treatment. She  has a past surgical history that includes Abdominal hysterectomy (Dec 2012); Tonsillectomy (May 2012); transthoracic echocardiogram (04/2014); 48 hour Holter Monitor (05/04/2014); Breast lumpectomy (Right, 1991 or 1992); Breast excisional biopsy (Right, 1990); ACDL (02/06/2017); and ATRIAL FIBRILLATION ABLATION (N/A, 02/17/2020). Patricia [name] has a current medication list which includes the following prescription(s): biotin, bupropion, vitamin d, cyclobenzaprine, fluticasone, gabapentin, loratadine, losartan, montelukast, omeprazole, OVER THE COUNTER MEDICATION, and pramipexole. Her primarily concern today is the No chief complaint on file.  Initial Vital Signs: Blood pressure 134/89, pulse 99,  temperature (!) 97.2 F (36.2 C), resp. rate 16, height 5\' 9"  (1.753 m), weight 216 lb (98 kg), SpO2 98 %. BMI: Estimated body mass index is 35.15 kg/m as calculated from the following:   Height as of this encounter: 5\' 9"  (1.753 m).   Weight as of this encounter: 238 lb (108 kg).  Risk Assessment: Allergies: Reviewed. She is allergic to venlafaxine, tramadol, clarithromycin, and zithromax [azithromycin].  Allergy Precautions: None required Coagulopathies: Reviewed. None identified.  Blood-thinner therapy: None at this time Active Infection(s): Reviewed. None identified. Patricia [name] is afebrile  Site Confirmation: Patricia [name] was asked to confirm the procedure and laterality before marking the site Procedure checklist: Completed Consent: Before the procedure and under the influence of no sedative(s), amnesic(s), or anxiolytics, the patient was informed of the treatment options, risks and possible complications. To fulfill our ethical and legal obligations, as recommended by the American Medical Association's Code of Ethics, I have informed the patient of my clinical impression; the nature and purpose of the treatment or procedure; the risks, benefits, and possible complications of the intervention; the alternatives, including doing nothing; the risk(s) and benefit(s) of the alternative treatment(s) or procedure(s); and the risk(s) and benefit(s) of doing nothing. The patient was provided information about the general risks and possible complications associated with the procedure. These may include, but are not limited to: failure to achieve desired goals, infection, bleeding, organ or nerve damage, allergic reactions, paralysis, and death. In addition, the patient was informed of those risks and complications associated to Spine-related procedures, such as failure to decrease pain; infection (i.e.: Meningitis, epidural or intraspinal abscess); bleeding (i.e.: epidural hematoma, subarachnoid  hemorrhage, or any other type of intraspinal or peri-dural bleeding); organ or nerve damage (i.e.: Any type of peripheral nerve, nerve root, or spinal cord injury) with subsequent damage to sensory, motor, and/or autonomic systems, resulting in permanent pain, numbness, and/or weakness  of one or several areas of the body; allergic reactions; (i.e.: anaphylactic reaction); and/or death. Furthermore, the patient was informed of those risks and complications associated with the medications. These include, but are not limited to: allergic reactions (i.e.: anaphylactic or anaphylactoid reaction(s)); adrenal axis suppression; blood sugar elevation that in diabetics may result in ketoacidosis or comma; water retention that in patients with history of congestive heart failure may result in shortness of breath, pulmonary edema, and decompensation with resultant heart failure; weight gain; swelling or edema; medication-induced neural toxicity; particulate matter embolism and blood vessel occlusion with resultant organ, and/or nervous system infarction; and/or aseptic necrosis of one or more joints. Finally, the patient was informed that Medicine is not an exact science; therefore, there is also the possibility of unforeseen or unpredictable risks and/or possible complications that may result in a catastrophic outcome. The patient indicated having understood very clearly. We have given the patient no guarantees and we have made no promises. Enough time was given to the patient to ask questions, all of which were answered to the patient's satisfaction. Patricia [name] has indicated that she wanted to continue with the procedure. Attestation: I, the ordering provider, attest that I have discussed with the patient the benefits, risks, side-effects, alternatives, likelihood of achieving goals, and potential problems during recovery for the procedure that I have provided informed consent. Date: 06/03/2020; Time: 2:43  PM  Pre-Procedure Preparation:  Monitoring: As per clinic protocol. Respiration, ETCO2, SpO2, BP, heart rate and rhythm monitor placed and checked for adequate function Safety Precautions: Patient was assessed for positional comfort and pressure points before starting the procedure. Time-out: I initiated and conducted the "Time-out" before starting the procedure, as per protocol. The patient was asked to participate by confirming the accuracy of the "Time Out" information. Verification of the correct person, site, and procedure were performed and confirmed by me, the nursing staff, and the patient. "Time-out" conducted as per Joint Commission's Universal Protocol (UP.01.01.01). "Time-out" Date & Time: 06/03/2020; 1148 hrs.  Description of Procedure Process:   Position: Prone with head of the table was raised to facilitate breathing. Target Area: For Epidural Steroid injections the target is the interlaminar space, initially targeting the lower border of the superior vertebral body lamina. Approach: Paramedial approach. Area Prepped: Entire PosteriorCervical Region Prepping solution: ChloraPrep (2% chlorhexidine gluconate and 70% isopropyl alcohol) Safety Precautions: Aspiration looking for blood return was conducted prior to all injections. At no point did we inject any substances, as a needle was being advanced. No attempts were made at seeking any paresthesias. Safe injection practices and needle disposal techniques used. Medications properly checked for expiration dates. SDV (single dose vial) medications used. Description of the Procedure: Protocol guidelines were followed. The procedure needle was introduced through the skin, ipsilateral to the reported pain, and advanced to the target area. Bone was contacted and the needle walked caudad, until the lamina was cleared. The epidural space was identified using "loss-of-resistance technique" with 2-3 ml of PF-NaCl (0.9% NSS), in a 5cc LOR glass  syringe. Vitals:   06/03/20 1146 06/03/20 1150 06/03/20 1152 06/03/20 1204  BP: (!) 156/108 (!) 165/118 (!) 145/126 (!) 151/102  Pulse:      Resp: 15 14 (!) 22   SpO2: 96% 96% 96%   Weight:      Height:        Start Time: 1148 hrs. End Time: 1151 hrs. Materials:  Needle(s) Type: Epidural needle Gauge: 22G Length: 3.5-in Medication(s): We administered iohexol, lidocaine, ropivacaine (PF)  2 mg/mL (0.2%), sodium chloride flush, and dexamethasone. Please see chart orders for dosing details. 4 cc solution made of 2 cc of normal saline preservative-free, 1 cc of 0.2% ropivacaine, 1 cc of Decadron 10 mg/cc. Imaging Guidance (Spinal):  Type of Imaging Technique: Fluoroscopy Guidance (Spinal) Indication(s): Assistance in needle guidance and placement for procedures requiring needle placement in or near specific anatomical locations not easily accessible without such assistance. Exposure Time: Please see nurses notes. Contrast: Before injecting any contrast, we confirmed that the patient did not have an allergy to iodine, shellfish, or radiological contrast. Once satisfactory needle placement was completed at the desired level, radiological contrast was injected. Contrast injected under live fluoroscopy. No contrast complications. See chart for type and volume of contrast used. Fluoroscopic Guidance: I was personally present during the use of fluoroscopy. "Tunnel Vision Technique" used to obtain the best possible view of the target area. Parallax error corrected before commencing the procedure. "Direction-depth-direction" technique used to introduce the needle under continuous pulsed fluoroscopy. Once target was reached, antero-posterior, oblique, and lateral fluoroscopic projection used confirm needle placement in all planes. Images permanently stored in EMR. Interpretation: I personally interpreted the imaging intraoperatively. Adequate needle placement confirmed in multiple planes. Appropriate  spread of contrast into desired area was observed. No evidence of afferent or efferent intravascular uptake. No intrathecal or subarachnoid spread observed. Permanent images saved into the patient's record.  Antibiotic Prophylaxis:  Indication(s): None identified Antibiotic given: None  Post-operative Assessment:  EBL: None Complications: No immediate post-treatment complications observed by team, or reported by patient. Note: The patient tolerated the entire procedure well. A repeat set of vitals were taken after the procedure and the patient was kept under observation following institutional policy, for this type of procedure. Post-procedural neurological assessment was performed, showing return to baseline, prior to discharge. The patient was provided with post-procedure discharge instructions, including a section on how to identify potential problems. Should any problems arise concerning this procedure, the patient was given instructions to immediately contact us, at any time, without hesitation. In any case, we plan to contact the patient by telephone for a follow-up status report regarding this interventional procedure. Comments:  No additional relevant information. 5 out of 5 left upper extremity strength: Shoulder abduction, elbow flexion, elbow extension, thumb extension (at baseline) 5 out of 5 strength right upper extremity: Shoulder abduction, elbow flexion, elbow extension, thumb extension.  (At baseline) Plan of Care   Imaging Orders     DG PAIN CLINIC C-ARM 1-60 MIN NO REPORT   Medications ordered for procedure: Meds ordered this encounter  Medications  . iohexol (OMNIPAQUE) 180 MG/ML injection 10 mL    Must be Myelogram-compatible. If not available, you may substitute with a water-soluble, non-ionic, hypoallergenic, myelogram-compatible radiological contrast medium.  Marland Kitchen lidocaine (XYLOCAINE) 2 % (with pres) injection 400 mg  . ropivacaine (PF) 2 mg/mL (0.2%) (NAROPIN)  injection 1 mL  . sodium chloride flush (NS) 0.9 % injection 1 mL  . dexamethasone (DECADRON) injection 10 mg   Medications administered: We administered iohexol, lidocaine, ropivacaine (PF) 2 mg/mL (0.2%), sodium chloride flush, and dexamethasone.  See the medical record for exact dosing, route, and time of administration.  New Prescriptions   No medications on file   Disposition: Discharge home  Discharge Date & Time: 06/03/2020; 1204 hrs.   Physician-requested Follow-up: Return in about 3 weeks (around 06/24/2020) for L-ESI. Future Appointments  Date Time Provider [place]  06/04/2020  9:00 AM [name], MD [facility]  [facility]  06/09/2020 10:45 AM [name], DO [facility] None   Primary Care Physician: [name], MD Location: [facility] Note by: Gillis Santa, MD Date: 06/03/2020; Time: 12:40 PM  Disclaimer:  Medicine is not an exact science. The only guarantee in medicine is that nothing is guaranteed. It is important to note that the decision to proceed with this intervention was based on the information collected from the patient. The Data and conclusions were drawn from the patient's questionnaire, the interview, and the physical examination. Because the information was provided in large part by the patient, it cannot be guaranteed that it has not been purposely or unconsciously manipulated. Every effort has been made to obtain as much relevant data as possible for this evaluation. It is important to note that the conclusions that lead to this procedure are derived in large part from the available data. Always take into account that the treatment will also be dependent on availability of resources and existing treatment guidelines, considered by other Pain Management Practitioners as being common knowledge and practice, at the time of the intervention. For Medico-Legal purposes, it is also important to point out that variation in procedural  techniques and pharmacological choices are the acceptable norm. The indications, contraindications, technique, and results of the above procedure should only be interpreted and judged by a Board-Certified Interventional Pain Specialist with extensive familiarity and expertise in the same exact procedure and technique.

## 2020-06-04 ENCOUNTER — Encounter: Payer: Self-pay | Admitting: Internal Medicine

## 2020-06-04 ENCOUNTER — Ambulatory Visit: Payer: 59 | Admitting: Internal Medicine

## 2020-06-04 ENCOUNTER — Telehealth: Payer: Self-pay | Admitting: *Deleted

## 2020-06-04 ENCOUNTER — Ambulatory Visit (INDEPENDENT_AMBULATORY_CARE_PROVIDER_SITE_OTHER): Payer: 59 | Admitting: Internal Medicine

## 2020-06-04 ENCOUNTER — Other Ambulatory Visit: Payer: Self-pay

## 2020-06-04 VITALS — BP 156/98 | HR 99 | Temp 97.1°F | Resp 15 | Ht 69.0 in | Wt 233.0 lb

## 2020-06-04 DIAGNOSIS — I10 Essential (primary) hypertension: Secondary | ICD-10-CM

## 2020-06-04 DIAGNOSIS — E1159 Type 2 diabetes mellitus with other circulatory complications: Secondary | ICD-10-CM

## 2020-06-04 DIAGNOSIS — E1169 Type 2 diabetes mellitus with other specified complication: Secondary | ICD-10-CM

## 2020-06-04 DIAGNOSIS — E119 Type 2 diabetes mellitus without complications: Secondary | ICD-10-CM | POA: Diagnosis not present

## 2020-06-04 DIAGNOSIS — Z1231 Encounter for screening mammogram for malignant neoplasm of breast: Secondary | ICD-10-CM

## 2020-06-04 DIAGNOSIS — G894 Chronic pain syndrome: Secondary | ICD-10-CM

## 2020-06-04 DIAGNOSIS — E785 Hyperlipidemia, unspecified: Secondary | ICD-10-CM | POA: Diagnosis not present

## 2020-06-04 DIAGNOSIS — Z23 Encounter for immunization: Secondary | ICD-10-CM

## 2020-06-04 DIAGNOSIS — F330 Major depressive disorder, recurrent, mild: Secondary | ICD-10-CM

## 2020-06-04 DIAGNOSIS — E538 Deficiency of other specified B group vitamins: Secondary | ICD-10-CM | POA: Diagnosis not present

## 2020-06-04 DIAGNOSIS — M5416 Radiculopathy, lumbar region: Secondary | ICD-10-CM

## 2020-06-04 DIAGNOSIS — E669 Obesity, unspecified: Secondary | ICD-10-CM

## 2020-06-04 DIAGNOSIS — I152 Hypertension secondary to endocrine disorders: Secondary | ICD-10-CM

## 2020-06-04 DIAGNOSIS — Z1211 Encounter for screening for malignant neoplasm of colon: Secondary | ICD-10-CM | POA: Diagnosis not present

## 2020-06-04 LAB — HEMOGLOBIN A1C: Hgb A1c MFr Bld: 7 % — ABNORMAL HIGH (ref 4.6–6.5)

## 2020-06-04 LAB — LIPID PANEL
Cholesterol: 216 mg/dL — ABNORMAL HIGH (ref 0–200)
HDL: 43.6 mg/dL (ref >39.00)
LDL Cholesterol: 133 mg/dL — ABNORMAL HIGH (ref 0–99)
NonHDL: 172.54
Total CHOL/HDL Ratio: 5
Triglycerides: 199 mg/dL — ABNORMAL HIGH (ref 0.0–149.0)
VLDL: 39.8 mg/dL (ref 0.0–40.0)

## 2020-06-04 LAB — COMPREHENSIVE METABOLIC PANEL
ALT: 15 U/L (ref 0–35)
AST: 12 U/L (ref 0–37)
Albumin: 4 g/dL (ref 3.5–5.2)
Alkaline Phosphatase: 52 U/L (ref 39–117)
BUN: 12 mg/dL (ref 6–23)
CO2: 26 mEq/L (ref 19–32)
Calcium: 9.3 mg/dL (ref 8.4–10.5)
Chloride: 101 mEq/L (ref 96–112)
Creatinine, Ser: 0.73 mg/dL (ref 0.40–1.20)
GFR: 95.77 mL/min (ref >60.00)
Glucose, Bld: 144 mg/dL — ABNORMAL HIGH (ref 70–99)
Potassium: 3.8 mEq/L (ref 3.5–5.1)
Sodium: 136 mEq/L (ref 135–145)
Total Bilirubin: 0.4 mg/dL (ref 0.2–1.2)
Total Protein: 7.1 g/dL (ref 6.0–8.3)

## 2020-06-04 LAB — B12 AND FOLATE PANEL
Folate: 20.5 ng/mL (ref >5.9)
Vitamin B-12: 469 pg/mL (ref 211–911)

## 2020-06-04 LAB — MICROALBUMIN / CREATININE URINE RATIO
Creatinine,U: 134.8 mg/dL
Microalb Creat Ratio: 0.7 mg/g (ref 0.0–30.0)
Microalb, Ur: 0.9 mg/dL (ref 0.0–1.9)

## 2020-06-04 LAB — TSH: TSH: 0.48 u[IU]/mL (ref 0.35–4.50)

## 2020-06-04 MED ORDER — GABAPENTIN 100 MG PO CAPS: 100.0000 mg | ORAL_CAPSULE | Freq: Three times a day (TID) | ORAL | 3 refills | Status: DC

## 2020-06-04 MED ORDER — MELOXICAM 15 MG PO TABS: 15.0000 mg | ORAL_TABLET | Freq: Every day | ORAL | 0 refills | Status: DC

## 2020-06-04 MED ORDER — OZEMPIC (0.25 OR 0.5 MG/DOSE) 2 MG/1.5ML ~~LOC~~ SOPN: 0.5000 mg | PEN_INJECTOR | SUBCUTANEOUS | 4 refills | Status: DC

## 2020-06-04 MED ORDER — OMEPRAZOLE 40 MG PO CPDR: 40.0000 mg | DELAYED_RELEASE_CAPSULE | Freq: Every day | ORAL | 3 refills | Status: DC

## 2020-06-04 MED ORDER — HYDROCHLOROTHIAZIDE 25 MG PO TABS: 25.0000 mg | ORAL_TABLET | Freq: Every day | ORAL | 3 refills | Status: DC

## 2020-06-04 MED ORDER — LIDOCAINE 5 % EX PTCH: 1.0000 | MEDICATED_PATCH | CUTANEOUS | 0 refills | Status: DC

## 2020-06-04 MED ORDER — BUPROPION HCL ER (XL) 300 MG PO TB24: 300.0000 mg | ORAL_TABLET | Freq: Every day | ORAL | 1 refills | Status: DC

## 2020-06-04 NOTE — Telephone Encounter (Signed)
Spoke with patient re; procedure on yesterday.  No questions or concerns.  
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
## 2020-06-04 NOTE — Patient Instructions (Addendum)
Prevnar 20 pneumonia vaccine given today  Cologuard ordered for colon CA screen   It comes by mail  Your annual mammogram has been ordered.  You are encouraged (required) to call to make your appointment at [facility]  [phone]  TDap needed at next visit   Increase wellbutrin to 300 mg daily   Adding lidoderm patch for 12 hours daily for back pain  Adding hctz for bp goal 130/80  meloxicam 15 mg once daily as your NSAID  Increased omeprazole to 40 mg daily   Starting ozempic for diabetes and weight loss 0.25 mg once a week .  If tolerated, fill rx
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
## 2020-06-06 DIAGNOSIS — E538 Deficiency of other specified B group vitamins: Secondary | ICD-10-CM | POA: Insufficient documentation

## 2020-06-06 NOTE — Assessment & Plan Note (Addendum)
Managed with oral supplementation thus far.  Continue oral supplementation.  Folate Is normal  Lab Results  Component Value Date   VITAMINB12 469 06/04/2020

## 2020-06-06 NOTE — Assessment & Plan Note (Signed)
Not at goal on maximal losartan dose.  Adding hctz 25 mg daily

## 2020-06-06 NOTE — Assessment & Plan Note (Signed)
Aggravated by chronic pain .  Increase wellbutrin to 300 mg daily and addressing pain issues with nonopioid analgesics

## 2020-06-06 NOTE — Assessment & Plan Note (Signed)
Adding lidoderm patches,  100 mg gabapentin for morning and afternoon doses

## 2020-06-06 NOTE — Assessment & Plan Note (Addendum)
Agree with initiating of  ozempic for management of weight gain and increase in a1c to 7.0. she prefers to avoid metformin for now but has no kown contraindication to either medicatio.   Continue ARB,  Will also need to discuss adding statin for management of LDL > 100 once ozempic has been tolerate.d   Lab Results  Component Value Date   HGBA1C 7.0 (H) 06/04/2020   Lab Results  Component Value Date   LABMICR Comment 01/07/2016   MICROALBUR 0.9 06/04/2020   MICROALBUR <0.7 08/16/2019     Lab Results  Component Value Date   CHOL 216 (H) 06/04/2020   HDL 43.60 06/04/2020   LDLCALC 133 (H) 06/04/2020   LDLDIRECT 116.0 06/07/2017   TRIG 199.0 (H) 06/04/2020   CHOLHDL 5 06/04/2020

## 2020-06-06 NOTE — Assessment & Plan Note (Addendum)
Recommending statin therapy  given her history of diabetes and hypertension is considered; however she has a recent  cardiaic evaluatio nahd her her coronary calcification  score is zero.  Will discuss at next visit.   Lab Results  Component Value Date   CHOL 216 (H) 06/04/2020   HDL 43.60 06/04/2020   LDLCALC 133 (H) 06/04/2020   LDLDIRECT 116.0 06/07/2017   TRIG 199.0 (H) 06/04/2020   CHOLHDL 5 06/04/2020
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
## 2020-06-09 ENCOUNTER — Other Ambulatory Visit: Payer: Self-pay

## 2020-06-09 ENCOUNTER — Ambulatory Visit (INDEPENDENT_AMBULATORY_CARE_PROVIDER_SITE_OTHER): Payer: 59 | Admitting: Family Medicine

## 2020-06-09 ENCOUNTER — Encounter: Payer: Self-pay | Admitting: Family Medicine

## 2020-06-09 VITALS — BP 120/90 | HR 99 | Ht 69.0 in | Wt 231.0 lb

## 2020-06-09 DIAGNOSIS — M9903 Segmental and somatic dysfunction of lumbar region: Secondary | ICD-10-CM | POA: Diagnosis not present

## 2020-06-09 DIAGNOSIS — M5416 Radiculopathy, lumbar region: Secondary | ICD-10-CM

## 2020-06-09 DIAGNOSIS — M9904 Segmental and somatic dysfunction of sacral region: Secondary | ICD-10-CM

## 2020-06-09 DIAGNOSIS — M9902 Segmental and somatic dysfunction of thoracic region: Secondary | ICD-10-CM

## 2020-06-09 MED ORDER — MELOXICAM 7.5 MG PO TABS: 7.5000 mg | ORAL_TABLET | Freq: Two times a day (BID) | ORAL | 0 refills | Status: DC

## 2020-06-09 NOTE — Assessment & Plan Note (Addendum)
Patient continues to have what appears to be a synovial cyst noted within the left-sided L5 nerve root impingement.  Encourage patient to try another epidural.  Patient is following up with them in the near future with the pain management physicians.  We did try osteopathic manipulation today.  Patient would like to avoid any type of surgical intervention and I am hoping the patient will be able to.  Patient did get a new job and this could be contributing to some of the discomfort and pain as well and we will monitor.  Patient was given a adjustable standing desk and notes that I think will be helpful for her work environment.  Follow-up with me again in 6 weeks   Due to patient having some stomach pain with high-dose meloxicam given by primary care provider we will try a lower dose to see if this would still be beneficial but decrease the risk of discomfort.  Patient is in agreement with the plan

## 2020-06-09 NOTE — Patient Instructions (Addendum)
Good to see you Note for adjustable standing desk meloxicam 7.5 mg Hip flexor exercises See me again in 5-6 weeks

## 2020-06-15 ENCOUNTER — Other Ambulatory Visit: Payer: Self-pay | Admitting: Internal Medicine

## 2020-06-15 NOTE — Telephone Encounter (Signed)
RX Refill:ambien Last Seen:06-04-20 Last ordered:UNK historical provider.

## 2020-06-16 ENCOUNTER — Encounter: Payer: Self-pay | Admitting: Student in an Organized Health Care Education/Training Program

## 2020-06-18 LAB — COLOGUARD: Cologuard: NEGATIVE

## 2020-06-24 ENCOUNTER — Encounter: Payer: Self-pay | Admitting: Student in an Organized Health Care Education/Training Program

## 2020-06-24 ENCOUNTER — Other Ambulatory Visit: Payer: Self-pay

## 2020-06-24 ENCOUNTER — Ambulatory Visit (HOSPITAL_BASED_OUTPATIENT_CLINIC_OR_DEPARTMENT_OTHER): Payer: 59 | Admitting: Student in an Organized Health Care Education/Training Program

## 2020-06-24 ENCOUNTER — Ambulatory Visit
Admission: RE | Admit: 2020-06-24 | Discharge: 2020-06-24 | Disposition: A | Discharge status: Home / Self Care | Payer: 59 | Source: Ambulatory Visit | Attending: Student in an Organized Health Care Education/Training Program | Admitting: Student in an Organized Health Care Education/Training Program

## 2020-06-24 DIAGNOSIS — G894 Chronic pain syndrome: Secondary | ICD-10-CM | POA: Insufficient documentation

## 2020-06-24 DIAGNOSIS — M5416 Radiculopathy, lumbar region: Secondary | ICD-10-CM | POA: Diagnosis present

## 2020-06-24 MED ORDER — ROPIVACAINE HCL 2 MG/ML IJ SOLN
2.0000 mL | Freq: Once | INTRAMUSCULAR | Status: AC
  Administered 2020-06-24: 2 mL via EPIDURAL

## 2020-06-24 MED ORDER — DEXAMETHASONE SODIUM PHOSPHATE 10 MG/ML IJ SOLN
10.0000 mg | Freq: Once | INTRAMUSCULAR | Status: AC
  Administered 2020-06-24: 10 mg
  Filled 2020-06-24: qty 1

## 2020-06-24 MED ORDER — SODIUM CHLORIDE 0.9% FLUSH
2.0000 mL | Freq: Once | INTRAVENOUS | Status: AC
  Administered 2020-06-24: 2 mL

## 2020-06-24 MED ORDER — IOHEXOL 180 MG/ML  SOLN
10.0000 mL | Freq: Once | INTRAMUSCULAR | Status: AC
  Administered 2020-06-24: 5 mL via EPIDURAL

## 2020-06-24 MED ORDER — LIDOCAINE HCL 2 % IJ SOLN
20.0000 mL | Freq: Once | INTRAMUSCULAR | Status: AC
  Administered 2020-06-24: 100 mg
  Filled 2020-06-24: qty 20

## 2020-06-24 NOTE — Progress Notes (Signed)
PROVIDER NOTE: Information contained herein reflects review and annotations entered in association with encounter. Interpretation of such information and data should be left to medically-trained personnel. Information provided to patient can be located elsewhere in the medical record under "Patient Instructions". Document created using STT-dictation technology, any transcriptional errors that may result from process are unintentional.    Patient: Patricia [name]  Service Category: Procedure  Provider: Gillis Santa, MD  DOB: [birth year]  DOS: 06/24/2020  Location: [facility]  MRN: [id number]  Setting: Ambulatory - outpatient  Referring Provider: Gillis Santa, MD  Type: Established Patient  Specialty: Interventional Pain Management  PCP: [name], MD   Primary Reason for Visit: Interventional Pain Management Treatment. CC: Back Pain (Lumbar bilateral )  Procedure:          Anesthesia, Analgesia, Anxiolysis:  Type: Therapeutic Inter-Laminar Epidural Steroid Injection  #2  Region: Lumbar Level: L4-5 Level. Laterality: Midline         Type: Local Anesthesia  Local Anesthetic: Lidocaine 1-2%  Position: Prone with head of the table was raised to facilitate breathing.   Indications: 1. Lumbar radiculopathy   2. Chronic pain syndrome    Pain Score: Pre-procedure: 3 /10 Post-procedure: 3 /10    Pre-op H&P Assessment:  Patricia [name] is a 50 y.o. (year old), female patient, seen today for interventional treatment. She  has a past surgical history that includes Abdominal hysterectomy (Dec 2012); Tonsillectomy (May 2012); transthoracic echocardiogram (04/2014); 48 hour Holter Monitor (05/04/2014); Breast lumpectomy (Right, 1991 or 1992); Breast excisional biopsy (Right, 1990); ACDL (02/06/2017); and ATRIAL FIBRILLATION ABLATION (N/A, 02/17/2020). Patricia [name] has a current medication list which includes the following prescription(s): acetaminophen, aspirin ec, biotin,  bupropion, vitamin d, cyclobenzaprine, fluticasone, gabapentin, gabapentin, hydrochlorothiazide, lidocaine, loratadine, losartan, metoprolol succinate, montelukast, omeprazole, OVER THE COUNTER MEDICATION, ozempic (0.25 or 0.5 mg/dose), zolpidem, meloxicam, meloxicam, and pramipexole. Her primarily concern today is the Back Pain (Lumbar bilateral )  Initial Vital Signs:  Pulse/HCG Rate: 89ECG Heart Rate: 91 Temp: (!) 97 F (36.1 C) Resp: 16 BP: 105/73 SpO2: 97 %  BMI: Estimated body mass index is 34.11 kg/m as calculated from the following:   Height as of 06/09/20: 5\' 9"  (1.753 m).   Weight as of 06/09/20: 231 lb (104.8 kg).  Risk Assessment: Allergies: Reviewed. She is allergic to venlafaxine, tramadol, clarithromycin, and zithromax [azithromycin].  Allergy Precautions: None required Coagulopathies: Reviewed. None identified.  Blood-thinner therapy: None at this time Active Infection(s): Reviewed. None identified. Patricia [name] is afebrile  Site Confirmation: Patricia [name] was asked to confirm the procedure and laterality before marking the site Procedure checklist: Completed Consent: Before the procedure and under the influence of no sedative(s), amnesic(s), or anxiolytics, the patient was informed of the treatment options, risks and possible complications. To fulfill our ethical and legal obligations, as recommended by the American Medical Association's Code of Ethics, I have informed the patient of my clinical impression; the nature and purpose of the treatment or procedure; the risks, benefits, and possible complications of the intervention; the alternatives, including doing nothing; the risk(s) and benefit(s) of the alternative treatment(s) or procedure(s); and the risk(s) and benefit(s) of doing nothing. The patient was provided information about the general risks and possible complications associated with the procedure. These may include, but are not limited to: failure to achieve  desired goals, infection, bleeding, organ or nerve damage, allergic reactions, paralysis, and death. In addition, the patient was informed of those risks and complications associated to Patricia [name]  procedures, such as failure to decrease pain; infection (i.e.: Meningitis, epidural or intraspinal abscess); bleeding (i.e.: epidural hematoma, subarachnoid hemorrhage, or any other type of intraspinal or peri-dural bleeding); organ or nerve damage (i.e.: Any type of peripheral nerve, nerve root, or spinal cord injury) with subsequent damage to sensory, motor, and/or autonomic systems, resulting in permanent pain, numbness, and/or weakness of one or several areas of the body; allergic reactions; (i.e.: anaphylactic reaction); and/or death. Furthermore, the patient was informed of those risks and complications associated with the medications. These include, but are not limited to: allergic reactions (i.e.: anaphylactic or anaphylactoid reaction(s)); adrenal axis suppression; blood sugar elevation that in diabetics may result in ketoacidosis or comma; water retention that in patients with history of congestive heart failure may result in shortness of breath, pulmonary edema, and decompensation with resultant heart failure; weight gain; swelling or edema; medication-induced neural toxicity; particulate matter embolism and blood vessel occlusion with resultant organ, and/or nervous system infarction; and/or aseptic necrosis of one or more joints. Finally, the patient was informed that Medicine is not an exact science; therefore, there is also the possibility of unforeseen or unpredictable risks and/or possible complications that may result in a catastrophic outcome. The patient indicated having understood very clearly. We have given the patient no guarantees and we have made no promises. Enough time was given to the patient to ask questions, all of which were answered to the patient's satisfaction. [name] has  indicated that she wanted to continue with the procedure. Attestation: I, the ordering provider, attest that I have discussed with the patient the benefits, risks, side-effects, alternatives, likelihood of achieving goals, and potential problems during recovery for the procedure that I have provided informed consent. Date  Time: 06/24/2020  1:11 PM  Pre-Procedure Preparation:  Monitoring: As per clinic protocol. Respiration, ETCO2, SpO2, BP, heart rate and rhythm monitor placed and checked for adequate function Safety Precautions: Patient was assessed for positional comfort and pressure points before starting the procedure. Time-out: I initiated and conducted the "Time-out" before starting the procedure, as per protocol. The patient was asked to participate by confirming the accuracy of the "Time Out" information. Verification of the correct person, site, and procedure were performed and confirmed by me, the nursing staff, and the patient. "Time-out" conducted as per Joint Commission's Universal Protocol (UP.01.01.01). Time: 1339  Description of Procedure:          Target Area: The interlaminar space, initially targeting the lower laminar border of the superior vertebral body. Approach: Paramedial approach. Area Prepped: Entire Posterior Lumbar Region DuraPrep (Iodine Povacrylex [0.7% available iodine] and Isopropyl Alcohol, 74% w/w) Safety Precautions: Aspiration looking for blood return was conducted prior to all injections. At no point did we inject any substances, as a needle was being advanced. No attempts were made at seeking any paresthesias. Safe injection practices and needle disposal techniques used. Medications properly checked for expiration dates. SDV (single dose vial) medications used. Description of the Procedure: Protocol guidelines were followed. The procedure needle was introduced through the skin, ipsilateral to the reported pain, and advanced to the target area. Bone was  contacted and the needle walked caudad, until the lamina was cleared. The epidural space was identified using "loss-of-resistance technique" with 2-3 ml of PF-NaCl (0.9% NSS), in a 5cc LOR glass syringe.  Vitals:   06/24/20 1317 06/24/20 1337 06/24/20 1342 06/24/20 1345  BP: 105/73 (!) 135/95 (!) 135/95 125/85  Pulse: 89     Resp: 16 16 18  16  Temp: (!) 97 F (36.1 C)     SpO2: 97% 96% 98% 97%     Start Time: 1339 hrs. End Time: 1344 hrs.  Materials:  Needle(s) Type: Epidural needle Gauge: 22G Length: 3.5-in Medication(s): Please see orders for medications and dosing details. 6 cc solution made of 3 cc of preservative-free saline, 2 cc of 0.2% ropivacaine, 1 cc of Decadron 10 mg/cc.  Imaging Guidance (Spinal):          Type of Imaging Technique: Fluoroscopy Guidance (Spinal) Indication(s): Assistance in needle guidance and placement for procedures requiring needle placement in or near specific anatomical locations not easily accessible without such assistance. Exposure Time: Please see nurses notes. Contrast: Before injecting any contrast, we confirmed that the patient did not have an allergy to iodine, shellfish, or radiological contrast. Once satisfactory needle placement was completed at the desired level, radiological contrast was injected. Contrast injected under live fluoroscopy. No contrast complications. See chart for type and volume of contrast used. Fluoroscopic Guidance: I was personally present during the use of fluoroscopy. "Tunnel Vision Technique" used to obtain the best possible view of the target area. Parallax error corrected before commencing the procedure. "Direction-depth-direction" technique used to introduce the needle under continuous pulsed fluoroscopy. Once target was reached, antero-posterior, oblique, and lateral fluoroscopic projection used confirm needle placement in all planes. Images permanently stored in EMR. Interpretation: I personally interpreted the  imaging intraoperatively. Adequate needle placement confirmed in multiple planes. Appropriate spread of contrast into desired area was observed. No evidence of afferent or efferent intravascular uptake. No intrathecal or subarachnoid spread observed. Permanent images saved into the patient's record.  Post-operative Assessment:  Post-procedure Vital Signs:  Pulse/HCG Rate: 8988 Temp: (!) 97 F (36.1 C) Resp: 16 BP: 125/85 SpO2: 97 %  EBL: None  Complications: No immediate post-treatment complications observed by team, or reported by patient.  Note: The patient tolerated the entire procedure well. A repeat set of vitals were taken after the procedure and the patient was kept under observation following institutional policy, for this type of procedure. Post-procedural neurological assessment was performed, showing return to baseline, prior to discharge. The patient was provided with post-procedure discharge instructions, including a section on how to identify potential problems. Should any problems arise concerning this procedure, the patient was given instructions to immediately contact us, at any time, without hesitation. In any case, we plan to contact the patient by telephone for a follow-up status report regarding this interventional procedure.  Comments:  No additional relevant information.  5 out of 5 strength bilateral lower extremity: Plantar flexion, dorsiflexion, knee flexion, knee extension.   Plan of Care  Orders:  Orders Placed This Encounter  Procedures   DG PAIN CLINIC C-ARM 1-60 MIN NO REPORT    Intraoperative interpretation by procedural physician at [place].    Standing Status:   Standing    Number of Occurrences:   1    Order Specific Question:   Reason for exam:    Answer:   Assistance in needle guidance and placement for procedures requiring needle placement in or near specific anatomical locations not easily accessible without such assistance.     Medications ordered for procedure: Meds ordered this encounter  Medications   iohexol (OMNIPAQUE) 180 MG/ML injection 10 mL    Must be Myelogram-compatible. If not available, you may substitute with a water-soluble, non-ionic, hypoallergenic, myelogram-compatible radiological contrast medium.   lidocaine (XYLOCAINE) 2 % (with pres) injection 400 mg   sodium chloride flush (  NS) 0.9 % injection 2 mL   ropivacaine (PF) 2 mg/mL (0.2%) (NAROPIN) injection 2 mL   dexamethasone (DECADRON) injection 10 mg    Medications administered: We administered iohexol, lidocaine, sodium chloride flush, ropivacaine (PF) 2 mg/mL (0.2%), and dexamethasone.  See the medical record for exact dosing, route, and time of administration.  Follow-up plan:   Return in about 4 weeks (around 07/22/2020) for Post Procedure Evaluation, virtual.    Recent Visits Date Type Provider Dept  06/03/20 Procedure visit Gillis Santa, MD [facility]  05/20/20 Telemedicine Gillis Santa, MD [facility]  04/29/20 Procedure visit Gillis Santa, MD [facility]  04/23/20 Telemedicine Gillis Santa, MD [facility]  Showing recent visits within past 90 days and meeting all other requirements Today's Visits Date Type Provider Dept  06/24/20 Procedure visit Gillis Santa, MD [facility]  Showing today's visits and meeting all other requirements Future Appointments Date Type Provider Dept  07/27/20 Appointment Gillis Santa, MD [facility]  Showing future appointments within next 90 days and meeting all other requirements Disposition: Discharge home  Discharge (Date  Time): 06/24/2020; 1355 hrs.   Primary Care Physician: [name], MD Location: [name] Note by: Gillis Santa, MD Date: 06/24/2020; Time: 2:18 PM  Disclaimer:  Medicine is not an exact science. The only guarantee in medicine is that nothing is guaranteed. It is  important to note that the decision to proceed with this intervention was based on the information collected from the patient. The Data and conclusions were drawn from the patient's questionnaire, the interview, and the physical examination. Because the information was provided in large part by the patient, it cannot be guaranteed that it has not been purposely or unconsciously manipulated. Every effort has been made to obtain as much relevant data as possible for this evaluation. It is important to note that the conclusions that lead to this procedure are derived in large part from the available data. Always take into account that the treatment will also be dependent on availability of resources and existing treatment guidelines, considered by other Pain Management Practitioners as being common knowledge and practice, at the time of the intervention. For Medico-Legal purposes, it is also important to point out that variation in procedural techniques and pharmacological choices are the acceptable norm. The indications, contraindications, technique, and results of the above procedure should only be interpreted and judged by a Board-Certified Interventional Pain Specialist with extensive familiarity and expertise in the same exact procedure and technique.

## 2020-06-24 NOTE — Progress Notes (Signed)
Safety precautions to be maintained throughout the outpatient stay will include: orient to surroundings, keep bed in low position, maintain call bell within reach at all times, provide assistance with transfer out of bed and ambulation.  

## 2020-06-24 NOTE — Patient Instructions (Signed)
Pain Management Discharge Instructions  General Discharge Instructions :  If you need to reach your doctor call: Monday-Friday 8:00 am - 4:00 pm at [phone] or toll free [phone].  After clinic hours [phone] to have operator reach doctor.  Bring all of your medication bottles to all your appointments in the pain clinic.  To cancel or reschedule your appointment with Pain Management please remember to call 24 hours in advance to avoid a fee.  Refer to the educational materials which you have been given on: General Risks, I had my Procedure. Discharge Instructions, Post Sedation.  Post Procedure Instructions:  The drugs you were given will stay in your system until tomorrow, so for the next 24 hours you should not drive, make any legal decisions or drink any alcoholic beverages.  You may eat anything you prefer, but it is better to start with liquids then soups and crackers, and gradually work up to solid foods.  Please notify your doctor immediately if you have any unusual bleeding, trouble breathing or pain that is not related to your normal pain.  Depending on the type of procedure that was done, some parts of your body may feel week and/or numb.  This usually clears up by tonight or the next day.  Walk with the use of an assistive device or accompanied by an adult for the 24 hours.  You may use ice on the affected area for the first 24 hours.  Put ice in a Ziploc bag and cover with a towel and place against area 15 minutes on 15 minutes off.  You may switch to heat after 24 hours.Epidural Steroid Injection Patient Information  Description: The epidural space surrounds the nerves as they exit the spinal cord.  In some patients, the nerves can be compressed and inflamed by a bulging disc or a tight spinal canal (spinal stenosis).  By injecting steroids into the epidural space, we can bring irritated nerves into direct contact with a potentially helpful medication.  These  steroids act directly on the irritated nerves and can reduce swelling and inflammation which often leads to decreased pain.  Epidural steroids may be injected anywhere along the spine and from the neck to the low back depending upon the location of your pain.   After numbing the skin with local anesthetic (like Novocaine), a small needle is passed into the epidural space slowly.  You may experience a sensation of pressure while this is being done.  The entire block usually last less than 10 minutes.  Conditions which may be treated by epidural steroids:  Low back and leg pain Neck and arm pain Spinal stenosis Post-laminectomy syndrome Herpes zoster (shingles) pain Pain from compression fractures  Preparation for the injection:  Do not eat any solid food or dairy products within 8 hours of your appointment.  You may drink clear liquids up to 3 hours before appointment.  Clear liquids include water, black coffee, juice or soda.  No milk or cream please. You may take your regular medication, including pain medications, with a sip of water before your appointment  Diabetics should hold regular insulin (if taken separately) and take 1/2 normal NPH dos the morning of the procedure.  Carry some sugar containing items with you to your appointment. A driver must accompany you and be prepared to drive you home after your procedure.  Bring all your current medications with your. An IV may be inserted and sedation may be given at the discretion of the physician.     A blood pressure cuff, EKG and other monitors will often be applied during the procedure.  Some patients may need to have extra oxygen administered for a short period. You will be asked to provide medical information, including your allergies, prior to the procedure.  We must know immediately if you are taking blood thinners (like Coumadin/Warfarin)  Or if you are allergic to IV iodine contrast (dye). We must know if you could possible be  pregnant.  Possible side-effects: Bleeding from needle site Infection (rare, may require surgery) Nerve injury (rare) Numbness & tingling (temporary) Difficulty urinating (rare, temporary) Spinal headache ( a headache worse with upright posture) Light -headedness (temporary) Pain at injection site (several days) Decreased blood pressure (temporary) Weakness in arm/leg (temporary) Pressure sensation in back/neck (temporary)  Call if you experience: Fever/chills associated with headache or increased back/neck pain. Headache worsened by an upright position. New onset weakness or numbness of an extremity below the injection site Hives or difficulty breathing (go to the emergency room) Inflammation or drainage at the infection site Severe back/neck pain Any new symptoms which are concerning to you  Please note:  Although the local anesthetic injected can often make your back or neck feel good for several hours after the injection, the pain will likely return.  It takes 3-7 days for steroids to work in the epidural space.  You may not notice any pain relief for at least that one week.  If effective, we will often do a series of three injections spaced 3-6 weeks apart to maximally decrease your pain.  After the initial series, we generally will wait several months before considering a repeat injection of the same type.  If you have any questions, please call [phone] [facility] 

## 2020-06-25 ENCOUNTER — Telehealth: Payer: Self-pay | Admitting: *Deleted

## 2020-06-25 NOTE — Telephone Encounter (Signed)
Attempted to call for post procedure follow-up. Message left. 

## 2020-07-02 ENCOUNTER — Other Ambulatory Visit: Payer: Self-pay

## 2020-07-02 ENCOUNTER — Encounter: Payer: Self-pay | Admitting: Internal Medicine

## 2020-07-02 ENCOUNTER — Ambulatory Visit (INDEPENDENT_AMBULATORY_CARE_PROVIDER_SITE_OTHER): Payer: 59 | Admitting: Internal Medicine

## 2020-07-02 VITALS — BP 118/78 | HR 97 | Temp 98.3°F | Ht 69.0 in | Wt 227.2 lb

## 2020-07-02 DIAGNOSIS — M5416 Radiculopathy, lumbar region: Secondary | ICD-10-CM | POA: Diagnosis not present

## 2020-07-02 DIAGNOSIS — I10 Essential (primary) hypertension: Secondary | ICD-10-CM | POA: Diagnosis not present

## 2020-07-02 DIAGNOSIS — F411 Generalized anxiety disorder: Secondary | ICD-10-CM

## 2020-07-02 DIAGNOSIS — F430 Acute stress reaction: Secondary | ICD-10-CM

## 2020-07-02 DIAGNOSIS — I152 Hypertension secondary to endocrine disorders: Secondary | ICD-10-CM

## 2020-07-02 DIAGNOSIS — E669 Obesity, unspecified: Secondary | ICD-10-CM

## 2020-07-02 DIAGNOSIS — E1169 Type 2 diabetes mellitus with other specified complication: Secondary | ICD-10-CM | POA: Diagnosis not present

## 2020-07-02 DIAGNOSIS — I480 Paroxysmal atrial fibrillation: Secondary | ICD-10-CM

## 2020-07-02 DIAGNOSIS — E1159 Type 2 diabetes mellitus with other circulatory complications: Secondary | ICD-10-CM

## 2020-07-02 LAB — BASIC METABOLIC PANEL
BUN: 10 mg/dL (ref 6–23)
CO2: 28 mEq/L (ref 19–32)
Calcium: 9.2 mg/dL (ref 8.4–10.5)
Chloride: 98 mEq/L (ref 96–112)
Creatinine, Ser: 0.73 mg/dL (ref 0.40–1.20)
GFR: 95.72 mL/min (ref >60.00)
Glucose, Bld: 99 mg/dL (ref 70–99)
Potassium: 3.5 mEq/L (ref 3.5–5.1)
Sodium: 134 mEq/L — ABNORMAL LOW (ref 135–145)

## 2020-07-02 MED ORDER — LOSARTAN POTASSIUM-HCTZ 100-25 MG PO TABS: 1.0000 | ORAL_TABLET | Freq: Every day | ORAL | 1 refills | Status: DC

## 2020-07-02 MED ORDER — BUPROPION HCL ER (XL) 150 MG PO TB24: 150.0000 mg | ORAL_TABLET | Freq: Every day | ORAL | 1 refills | Status: DC

## 2020-07-02 MED ORDER — OZEMPIC (0.25 OR 0.5 MG/DOSE) 2 MG/1.5ML ~~LOC~~ SOPN: 0.5000 mg | PEN_INJECTOR | SUBCUTANEOUS | 4 refills | Status: DC

## 2020-07-02 MED ORDER — BUTALBITAL-APAP-CAFFEINE 50-325-40 MG PO TABS: 1.0000 | ORAL_TABLET | Freq: Four times a day (QID) | ORAL | 0 refills | Status: DC | PRN

## 2020-07-02 MED ORDER — CELECOXIB 100 MG PO CAPS: 100.0000 mg | ORAL_CAPSULE | Freq: Two times a day (BID) | ORAL | 0 refills | Status: DC

## 2020-07-02 MED ORDER — ONDANSETRON 4 MG PO TBDP: 4.0000 mg | ORAL_TABLET | Freq: Three times a day (TID) | ORAL | 0 refills | Status: DC | PRN

## 2020-07-02 MED ORDER — GABAPENTIN 100 MG PO CAPS: 100.0000 mg | ORAL_CAPSULE | Freq: Every day | ORAL | 3 refills | Status: DC

## 2020-07-02 NOTE — Patient Instructions (Signed)
You can combine the losartan and hctz into one pill  I would keep the separate medications as a back up in the event that you need to suspend the hctz during a GI illness   Trial of celebrex 100 mg twice daily instead of meloxicam  Trial of Fioricet twice daily for the back pain:  each tablet contains caffeine, butalbital and 325 mg tylenol   (Remember you should limit your daily cumulative  tylenol dose to  2000 mg )
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
## 2020-07-05 NOTE — Assessment & Plan Note (Addendum)
Continue low dose of  ozempic for management of weight gain and increase in a1c to 7.0. she prefers to avoid metformin for now but has no kown contraindication to either medicatio.   Continue ARB,  Will also need to discuss adding statin for management of LDL > 100 once ozempic has been better  tolerated   Lab Results  Component Value Date   HGBA1C 7.0 (H) 06/04/2020   Lab Results  Component Value Date   LABMICR Comment 01/07/2016   MICROALBUR 0.9 06/04/2020   MICROALBUR <0.7 08/16/2019     Lab Results  Component Value Date   CHOL 216 (H) 06/04/2020   HDL 43.60 06/04/2020   LDLCALC 133 (H) 06/04/2020   LDLDIRECT 116.0 06/07/2017   TRIG 199.0 (H) 06/04/2020   CHOLHDL 5 06/04/2020

## 2020-07-05 NOTE — Assessment & Plan Note (Signed)
Aggravated by increased dose of wellbutrin for management of concurrent depression.  Reduce wellbutrin to 150 mg daily .

## 2020-07-05 NOTE — Assessment & Plan Note (Signed)
Improved and at goal on lossartan 100 mg daiy and hctz 25 mg daily . Slightly low sodium addressed  . Lab Results  Component Value Date   NA 134 (L) 07/02/2020   K 3.5 07/02/2020   CL 98 07/02/2020   CO2 28 07/02/2020   Lab Results  Component Value Date   CREATININE 0.73 07/02/2020

## 2020-07-05 NOTE — Assessment & Plan Note (Signed)
She has a Left L4-5 foraminal protrusion/annular fissuring and 0.8 cm synovial cyst effacing the subarticular recess with medial displacement of the left L5 nerve root. Mild spinal canal and left neural foraminal  narrowing at this level. She has had 2 ESI with limted succwess at relieving her chronc pain.  Adding tramadol twice daily to regimen of meloxicam, tylenol and gabapentin

## 2020-07-05 NOTE — Assessment & Plan Note (Signed)
Post ablation on February 17, 2020.  Doing well and maintaining sinus rhythm.  She is now off her Xarelto.  She is back on aspirin 81 mg by mouth daily for stroke prophylaxis given her CHA2DS2-VASc score of 2.  continue metoprolol

## 2020-07-14 ENCOUNTER — Telehealth: Payer: Self-pay | Admitting: Internal Medicine

## 2020-07-14 ENCOUNTER — Encounter: Payer: Self-pay | Admitting: Internal Medicine

## 2020-07-14 MED ORDER — LOSARTAN POTASSIUM-HCTZ 100-25 MG PO TABS: 1.0000 | ORAL_TABLET | Freq: Every day | ORAL | 1 refills | Status: DC

## 2020-07-15 ENCOUNTER — Ambulatory Visit
Admission: RE | Admit: 2020-07-15 | Discharge: 2020-07-15 | Disposition: A | Discharge status: Home / Self Care | Payer: 59 | Source: Ambulatory Visit | Attending: Internal Medicine | Admitting: Internal Medicine

## 2020-07-15 ENCOUNTER — Other Ambulatory Visit: Payer: Self-pay

## 2020-07-15 DIAGNOSIS — Z1231 Encounter for screening mammogram for malignant neoplasm of breast: Secondary | ICD-10-CM | POA: Diagnosis not present
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
## 2020-07-23 ENCOUNTER — Encounter: Payer: Self-pay | Admitting: Family Medicine

## 2020-07-23 ENCOUNTER — Ambulatory Visit (INDEPENDENT_AMBULATORY_CARE_PROVIDER_SITE_OTHER): Payer: 59 | Admitting: Family Medicine

## 2020-07-23 ENCOUNTER — Other Ambulatory Visit: Payer: Self-pay

## 2020-07-23 VITALS — BP 134/84 | HR 99 | Ht 69.0 in | Wt 226.0 lb

## 2020-07-23 DIAGNOSIS — M5412 Radiculopathy, cervical region: Secondary | ICD-10-CM | POA: Diagnosis not present

## 2020-07-23 DIAGNOSIS — M5416 Radiculopathy, lumbar region: Secondary | ICD-10-CM

## 2020-07-23 DIAGNOSIS — M9903 Segmental and somatic dysfunction of lumbar region: Secondary | ICD-10-CM

## 2020-07-23 DIAGNOSIS — M9904 Segmental and somatic dysfunction of sacral region: Secondary | ICD-10-CM

## 2020-07-23 DIAGNOSIS — M999 Biomechanical lesion, unspecified: Secondary | ICD-10-CM | POA: Diagnosis not present

## 2020-07-23 DIAGNOSIS — M9902 Segmental and somatic dysfunction of thoracic region: Secondary | ICD-10-CM | POA: Diagnosis not present

## 2020-07-23 MED ORDER — KETOROLAC TROMETHAMINE 60 MG/2ML IM SOLN
60.0000 mg | Freq: Once | INTRAMUSCULAR | Status: AC
  Administered 2020-07-23: 60 mg via INTRAMUSCULAR

## 2020-07-23 MED ORDER — METHYLPREDNISOLONE ACETATE 80 MG/ML IJ SUSP
80.0000 mg | Freq: Once | INTRAMUSCULAR | Status: AC
  Administered 2020-07-23: 80 mg via INTRAMUSCULAR

## 2020-07-23 NOTE — Patient Instructions (Addendum)
Good to see you Injections today Hold the celebrex tonight can try 200 mg during the day and can increase to 200 mg long term if it doesn't effect your stomach See me again in 4-6 weeks

## 2020-07-24 ENCOUNTER — Encounter: Payer: Self-pay | Admitting: Family Medicine

## 2020-07-24 NOTE — Assessment & Plan Note (Signed)
Patient has had a lumbar radiculopathy.  Patient did not respond well to the last epidural.  Does respond somewhat to manipulation.  Toradol and Depo-Medrol given for some pain relief.  Discussed the possibility of steroids but patient wants to avoid.  Discussed with patient another opportunity would be to discuss the possibility of surgical intervention for the synovial cyst.  Patient does not want to do that yet.  Follow-up with me again in 4 to 6 weeks

## 2020-07-27 ENCOUNTER — Ambulatory Visit
Payer: 59 | Attending: Student in an Organized Health Care Education/Training Program | Admitting: Student in an Organized Health Care Education/Training Program

## 2020-07-27 ENCOUNTER — Other Ambulatory Visit: Payer: Self-pay

## 2020-07-27 ENCOUNTER — Encounter: Payer: Self-pay | Admitting: Student in an Organized Health Care Education/Training Program

## 2020-07-27 DIAGNOSIS — G894 Chronic pain syndrome: Secondary | ICD-10-CM

## 2020-07-27 DIAGNOSIS — M5136 Other intervertebral disc degeneration, lumbar region: Secondary | ICD-10-CM

## 2020-07-27 DIAGNOSIS — M51369 Other intervertebral disc degeneration, lumbar region without mention of lumbar back pain or lower extremity pain: Secondary | ICD-10-CM

## 2020-07-27 DIAGNOSIS — M5416 Radiculopathy, lumbar region: Secondary | ICD-10-CM | POA: Diagnosis not present

## 2020-07-27 DIAGNOSIS — M5414 Radiculopathy, thoracic region: Secondary | ICD-10-CM

## 2020-07-27 MED ORDER — CELECOXIB 100 MG PO CAPS: 100.0000 mg | ORAL_CAPSULE | Freq: Two times a day (BID) | ORAL | 0 refills | Status: DC
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
## 2020-07-30 DIAGNOSIS — C4371 Malignant melanoma of right lower limb, including hip: Secondary | ICD-10-CM

## 2020-08-05 ENCOUNTER — Other Ambulatory Visit: Payer: Self-pay | Admitting: Internal Medicine

## 2020-08-05 ENCOUNTER — Other Ambulatory Visit: Payer: Self-pay | Admitting: Student in an Organized Health Care Education/Training Program

## 2020-08-05 DIAGNOSIS — M5410 Radiculopathy, site unspecified: Secondary | ICD-10-CM

## 2020-08-05 NOTE — Addendum Note (Signed)
Addended by: [name] on: 08/05/2020 02:33 PM   Modules accepted: Orders

## 2020-08-17 ENCOUNTER — Other Ambulatory Visit: Payer: Self-pay | Admitting: Internal Medicine
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
## 2020-08-26 ENCOUNTER — Other Ambulatory Visit: Payer: Self-pay

## 2020-08-26 ENCOUNTER — Encounter: Payer: Self-pay | Admitting: Family Medicine

## 2020-08-26 ENCOUNTER — Ambulatory Visit (INDEPENDENT_AMBULATORY_CARE_PROVIDER_SITE_OTHER): Payer: 59 | Admitting: Family Medicine

## 2020-08-26 VITALS — BP 112/82 | HR 90 | Ht 69.0 in | Wt 222.0 lb

## 2020-08-26 DIAGNOSIS — M9902 Segmental and somatic dysfunction of thoracic region: Secondary | ICD-10-CM | POA: Diagnosis not present

## 2020-08-26 DIAGNOSIS — M9904 Segmental and somatic dysfunction of sacral region: Secondary | ICD-10-CM | POA: Diagnosis not present

## 2020-08-26 DIAGNOSIS — M9903 Segmental and somatic dysfunction of lumbar region: Secondary | ICD-10-CM

## 2020-08-26 DIAGNOSIS — M5136 Other intervertebral disc degeneration, lumbar region: Secondary | ICD-10-CM | POA: Diagnosis not present

## 2020-08-26 DIAGNOSIS — M51369 Other intervertebral disc degeneration, lumbar region without mention of lumbar back pain or lower extremity pain: Secondary | ICD-10-CM

## 2020-08-26 NOTE — Assessment & Plan Note (Signed)
Patient likely is doing relatively well at the moment.  Discussed posture and ergonomics, discussed which activities to do which wants to avoid.  Increase activity slowly.  Discussed avoiding certain activities.  Patient though is doing better with the Celebrex and does have room to potentially increase as necessary.  Patient is also using the gabapentin intermittently.  Encourage patient to continue to be as active as she has been and follow-up with me again in 5 to 6 weeks

## 2020-08-26 NOTE — Patient Instructions (Signed)
Play with Celebrex and gabapentin  See me in 5-6 weeks

## 2020-08-27 ENCOUNTER — Ambulatory Visit: Payer: 59 | Admitting: Family Medicine

## 2020-09-15 ENCOUNTER — Other Ambulatory Visit: Payer: Self-pay | Admitting: Internal Medicine
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
## 2020-10-08 ENCOUNTER — Other Ambulatory Visit: Payer: Self-pay

## 2020-10-08 ENCOUNTER — Ambulatory Visit (INDEPENDENT_AMBULATORY_CARE_PROVIDER_SITE_OTHER): Payer: 59 | Admitting: Family Medicine

## 2020-10-08 VITALS — BP 110/78 | HR 87 | Ht 69.0 in | Wt 217.0 lb

## 2020-10-08 DIAGNOSIS — G894 Chronic pain syndrome: Secondary | ICD-10-CM

## 2020-10-08 DIAGNOSIS — M9901 Segmental and somatic dysfunction of cervical region: Secondary | ICD-10-CM

## 2020-10-08 DIAGNOSIS — M9904 Segmental and somatic dysfunction of sacral region: Secondary | ICD-10-CM | POA: Diagnosis not present

## 2020-10-08 DIAGNOSIS — M9903 Segmental and somatic dysfunction of lumbar region: Secondary | ICD-10-CM

## 2020-10-08 DIAGNOSIS — M9902 Segmental and somatic dysfunction of thoracic region: Secondary | ICD-10-CM | POA: Diagnosis not present

## 2020-10-08 DIAGNOSIS — M9908 Segmental and somatic dysfunction of rib cage: Secondary | ICD-10-CM | POA: Diagnosis not present

## 2020-10-08 MED ORDER — CEFDINIR 300 MG PO CAPS: 300.0000 mg | ORAL_CAPSULE | Freq: Two times a day (BID) | ORAL | 0 refills | Status: DC

## 2020-10-08 NOTE — Assessment & Plan Note (Signed)
Patient has responded very well though to osteopathic manipulation.  The patient is having some cervicogenic headache as well as what seems to be more sinus pressure.  Given some mild oral antibiotic suppressive therapy.  Warned of potential side effects.  Patient has taken this before and feels like it has been more beneficial.  Discussed with patient though about home exercises, icing regimen and which I consider which wants to avoid.  Increase activity slowly with me follow-up again in 6 to 8 weeks

## 2020-10-08 NOTE — Patient Instructions (Signed)
Good to see you! Sending in antibiotic just in case See you again in 6-8 weeks

## 2020-10-14 DIAGNOSIS — E669 Obesity, unspecified: Secondary | ICD-10-CM

## 2020-10-14 DIAGNOSIS — E1169 Type 2 diabetes mellitus with other specified complication: Secondary | ICD-10-CM

## 2020-10-16 MED ORDER — BUPROPION HCL 75 MG PO TABS: 75.0000 mg | ORAL_TABLET | Freq: Two times a day (BID) | ORAL | 0 refills | Status: DC

## 2020-10-16 MED ORDER — OZEMPIC (1 MG/DOSE) 4 MG/3ML ~~LOC~~ SOPN
1.0000 mg | PEN_INJECTOR | SUBCUTANEOUS | 2 refills | Status: DC
Start: 2020-10-16 — End: 2021-06-02

## 2020-11-02 ENCOUNTER — Other Ambulatory Visit: Payer: Self-pay | Admitting: Internal Medicine

## 2020-11-02 NOTE — Telephone Encounter (Signed)
RX Refill: Patricia [name] Last Seen: 07-02-20 Last Ordered: 06-15-20 Next Appt: NA

## 2020-11-03 ENCOUNTER — Other Ambulatory Visit: Payer: Self-pay

## 2020-11-03 MED ORDER — CELECOXIB 100 MG PO CAPS: 100.0000 mg | ORAL_CAPSULE | Freq: Two times a day (BID) | ORAL | 0 refills | Status: DC

## 2020-11-23 ENCOUNTER — Encounter: Payer: Self-pay | Admitting: Family Medicine

## 2020-11-23 ENCOUNTER — Encounter: Payer: Self-pay | Admitting: *Deleted

## 2020-11-24 ENCOUNTER — Other Ambulatory Visit: Payer: Self-pay | Admitting: Internal Medicine

## 2020-11-24 DIAGNOSIS — E669 Obesity, unspecified: Secondary | ICD-10-CM

## 2020-11-24 DIAGNOSIS — E1159 Type 2 diabetes mellitus with other circulatory complications: Secondary | ICD-10-CM

## 2020-11-24 DIAGNOSIS — R232 Flushing: Secondary | ICD-10-CM

## 2020-11-24 DIAGNOSIS — E538 Deficiency of other specified B group vitamins: Secondary | ICD-10-CM

## 2020-11-24 DIAGNOSIS — E079 Disorder of thyroid, unspecified: Secondary | ICD-10-CM

## 2020-11-24 NOTE — Telephone Encounter (Signed)
Pt called in regards to medication Celebrex, stating she was written a prescription for 100mg  twice daily. Pt is stating she was supposed to receive a prescription for 200mg  twice daily. Pt is completely out of medication due to adjusting her dosage to oblige by what she was told. Pt uses [facility].

## 2020-11-25 ENCOUNTER — Other Ambulatory Visit: Payer: Self-pay | Admitting: Internal Medicine

## 2020-11-25 MED ORDER — CELECOXIB 200 MG PO CAPS: 200.0000 mg | ORAL_CAPSULE | Freq: Two times a day (BID) | ORAL | 0 refills | Status: DC

## 2020-11-26 ENCOUNTER — Ambulatory Visit (INDEPENDENT_AMBULATORY_CARE_PROVIDER_SITE_OTHER): Payer: 59 | Admitting: Sports Medicine

## 2020-11-26 ENCOUNTER — Other Ambulatory Visit: Payer: Self-pay

## 2020-11-26 VITALS — BP 128/32 | HR 85 | Ht 69.0 in | Wt 214.0 lb

## 2020-11-26 DIAGNOSIS — Z981 Arthrodesis status: Secondary | ICD-10-CM | POA: Diagnosis not present

## 2020-11-26 DIAGNOSIS — M542 Cervicalgia: Secondary | ICD-10-CM | POA: Diagnosis not present

## 2020-11-26 DIAGNOSIS — M9908 Segmental and somatic dysfunction of rib cage: Secondary | ICD-10-CM

## 2020-11-26 DIAGNOSIS — M9902 Segmental and somatic dysfunction of thoracic region: Secondary | ICD-10-CM | POA: Diagnosis not present

## 2020-11-26 DIAGNOSIS — M9901 Segmental and somatic dysfunction of cervical region: Secondary | ICD-10-CM | POA: Diagnosis not present

## 2020-11-26 DIAGNOSIS — M9903 Segmental and somatic dysfunction of lumbar region: Secondary | ICD-10-CM

## 2020-11-26 DIAGNOSIS — M9905 Segmental and somatic dysfunction of pelvic region: Secondary | ICD-10-CM

## 2020-11-26 NOTE — Patient Instructions (Addendum)
Good to see you  Keep December appointment for repeat OMT

## 2020-11-26 NOTE — Progress Notes (Signed)
Patricia [name] D.[place] Phone: [phone]   Assessment and Plan:     1. Hx of fusion of cervical spine 2. Neck pain 3. Somatic dysfunction of cervical region 4. Somatic dysfunction of thoracic region 5. Somatic dysfunction of lumbar region 6. Somatic dysfunction of pelvic region 7. Somatic dysfunction of rib region -Chronic with exacerbation, subsequent visit - Recurrence of typical musculoskeletal complaints with worst being in neck and upper thoracic spine with history of C-spine fusion - No HVLA performed on C-spine due to history of fusion - Patient has received significant relief with OMT in the past.  Elects for repeat OMT today.  Tolerated well per note below. - Decision today to treat with OMT was based on Physical Exam   After verbal consent patient was treated with HVLA (high velocity low amplitude), ME (muscle energy), FPR (flex positional release), ST (soft tissue), PC/PD (Pelvic Compression/ Pelvic Decompression) techniques in cervical, rib, thoracic, lumbar, and pelvic areas. Patient tolerated the procedure well with improvement in symptoms.  Patient educated on potential side effects of soreness and recommended to rest, hydrate, and use Tylenol as needed for pain control.   Pertinent previous records reviewed include C-spine MRI   Follow Up: 4 weeks for repeat OMT   Subjective:   I, Patricia [name], am serving as a scribe for Dr. Glennon [name]  Chief Complaint: neck and back pain   HPI:   11/26/20 Patient is a 50 year old female presenting with neck and back pain. Patient was last seen by Dr. Tamala [name] on 9/29/2 for this reason and had OMT. Today patient states that she is ready for maintenance OMT, did have two 12 hours shift the last two days so is tight.   Relevant Historical Information: History of 4 level C-spine fusion  Additional pertinent review of systems negative.  Current Outpatient  Medications  Medication Sig Dispense Refill   acetaminophen (TYLENOL) 500 MG tablet Take 500 mg by mouth every 6 (six) hours as needed. Taking 3,000 mg daily     aspirin EC 81 MG tablet Take 81 mg by mouth daily. Swallow whole.     Biotin 1000 MCG tablet Take 1,000 mcg by mouth daily.     butalbital-acetaminophen-caffeine (FIORICET) 50-325-40 MG tablet TAKE ONE TABLET BY MOUTH EVERY 6 HOURS AS NEEDED FOR PAIN 60 tablet 2   celecoxib (CELEBREX) 200 MG capsule Take 1 capsule (200 mg total) by mouth 2 (two) times daily. 180 capsule 0   Cholecalciferol (VITAMIN D) 50 MCG (2000 UT) tablet Take 2,000 Units by mouth daily.     cyclobenzaprine (FLEXERIL) 10 MG tablet Take 1 tablet (10 mg total) by mouth 3 (three) times daily as needed for muscle spasms. 90 tablet 2   fluticasone (FLONASE) 50 MCG/ACT nasal spray Place 2 sprays into both nostrils daily. 16 g 6   gabapentin (NEURONTIN) 100 MG capsule Take 1 capsule (100 mg total) by mouth at bedtime. USING 300 MG AT BEDTIME WITH OTHER RX 90 capsule 3   lidocaine (LIDODERM) 5 % Place 1 patch onto the skin daily. Remove & Discard patch within 12 hours or as directed by MD 30 patch 0   loratadine (CLARITIN) 10 MG tablet Take 1 tablet (10 mg total) by mouth daily. 90 tablet 2   losartan-hydrochlorothiazide (HYZAAR) 100-25 MG tablet TAKE 1 TABLET BY MOUTH DAILY 90 tablet 1   Metoprolol Succinate 25 MG CS24 Take 25 mg by mouth daily as  needed. As needed for afib.     metoprolol tartrate (LOPRESSOR) 25 MG tablet TAKE 0.5 TABLET TO 1 TABLET BY MOUTH TWICE DAILY AS NEEDED FOR PALPITAIONS 60 tablet 2   montelukast (SINGULAIR) 10 MG tablet TAKE ONE TABLET AT BEDTIME 90 tablet 3   omeprazole (PRILOSEC) 40 MG capsule TAKE 1 CAPSULE BY MOUTH DAILY. 30 capsule 3   ondansetron (ZOFRAN ODT) 4 MG disintegrating tablet Take 1 tablet (4 mg total) by mouth every 8 (eight) hours as needed for nausea or vomiting. 20 tablet 0   OVER THE COUNTER MEDICATION Take 1,500 mg by mouth  daily. Calcium Pyurbate     Semaglutide, 1 MG/DOSE, (OZEMPIC, 1 MG/DOSE,) 4 MG/3ML SOPN Inject 1 mg into the skin once a week. 3 mL 2   zolpidem (AMBIEN) 10 MG tablet TAKE 1 TABLET BY MOUTH AT BEDTIME AS NEEDED FOR SLEEP. 30 tablet 5   No current facility-administered medications for this visit.      Objective:     Vitals:   11/26/20 0930  BP: (!) 128/32  Pulse: 85  SpO2: 98%  Weight: 214 lb (97.1 kg)  Height: 5\' 9"  (1.753 m)      Body mass index is 31.6 kg/m.    Physical Exam:     General: Well-appearing, cooperative, sitting comfortably in no acute distress.   OMT Physical Exam:  ASIS Compression Test: Positive Right Cervical: TTP paraspinal, C2 RR, minimal sidebending and rotation of C4-7 with history of fusion Rib: Ribs 6-8 inhalation dysfunction on left Thoracic: TTP paraspinal, T2-4 RRSL, T6-8 RLSR Lumbar: TTP paraspinal, L5 RR Pelvis: Right posterior innominate with out flare  Electronically signed by:  Patricia [name] D.Patricia [name] 10:04 AM 11/26/20

## 2020-12-08 ENCOUNTER — Encounter: Payer: Self-pay | Admitting: Family Medicine

## 2020-12-09 NOTE — Progress Notes (Signed)
Patricia [name] D.[place] Patricia [name] Phone: [phone]   Assessment and Plan:     1. Pain of left heel -Acute, uncomplicated, initial visit - 2 weeks left heel pain without MOI - Most likely pain of left heel pad with patient working 12-hour shifts and being ambulatory, and calcaneal heel spur being present.  No evidence of stress reaction or stress fracture on x-ray - Advised for patient to use inserts or shoes with a significant mount of cushioning while it is painful to walk - Continue Celebrex with Tylenol for breakthrough pain - X-ray obtained in clinic.  My interpretation: No acute fracture or dislocation.  Large heel spur at plantar surface of calcaneus   Pertinent previous records reviewed include none   Follow Up: 4 weeks for reevaluation.  If no improvement or worsening of symptoms could consider ultrasound versus advanced imaging   Subjective:   I, [name], am serving as a scribe for Dr. Glennon Mac  Chief Complaint: Heel pain   HPI:   12/10/20 Patient is a 50 year old female presenting with Left heel pain. Patient states that there is no MOI but it has been going on 12 days with this pain and it will not go away. Patient locates pain to to the left heel no radiating pain, but has noticed some left knee pain with he compensating for the pain.  Patient was last seen on 11/26/20 for OMT.  Radiates: no Swelling: yes after her 12 hour shift  Aggravates: any pressure  Treatments tried: slippers with foam, Celebrex, Tylenol, lidocaine patch, compression     Relevant Historical Information: Hypertension, history of multiple radicular pains and chronic pain syndrome  Additional pertinent review of systems negative.   Current Outpatient Medications:    acetaminophen (TYLENOL) 500 MG tablet, Take 500 mg by mouth every 6 (six) hours as needed. Taking 3,000 mg daily, Disp: , Rfl:    aspirin EC 81  MG tablet, Take 81 mg by mouth daily. Swallow whole., Disp: , Rfl:    Biotin 1000 MCG tablet, Take 1,000 mcg by mouth daily., Disp: , Rfl:    butalbital-acetaminophen-caffeine (FIORICET) 50-325-40 MG tablet, TAKE ONE TABLET BY MOUTH EVERY 6 HOURS AS NEEDED FOR PAIN, Disp: 60 tablet, Rfl: 2   celecoxib (CELEBREX) 200 MG capsule, Take 1 capsule (200 mg total) by mouth 2 (two) times daily., Disp: 180 capsule, Rfl: 0   Cholecalciferol (VITAMIN D) 50 MCG (2000 UT) tablet, Take 2,000 Units by mouth daily., Disp: , Rfl:    cyclobenzaprine (FLEXERIL) 10 MG tablet, Take 1 tablet (10 mg total) by mouth 3 (three) times daily as needed for muscle spasms., Disp: 90 tablet, Rfl: 2   fluticasone (FLONASE) 50 MCG/ACT nasal spray, Place 2 sprays into both nostrils daily., Disp: 16 g, Rfl: 6   gabapentin (NEURONTIN) 100 MG capsule, Take 1 capsule (100 mg total) by mouth at bedtime. USING 300 MG AT BEDTIME WITH OTHER RX, Disp: 90 capsule, Rfl: 3   lidocaine (LIDODERM) 5 %, Place 1 patch onto the skin daily. Remove & Discard patch within 12 hours or as directed by MD, Disp: 30 patch, Rfl: 0   loratadine (CLARITIN) 10 MG tablet, Take 1 tablet (10 mg total) by mouth daily., Disp: 90 tablet, Rfl: 2   losartan-hydrochlorothiazide (HYZAAR) 100-25 MG tablet, TAKE 1 TABLET BY MOUTH DAILY, Disp: 90 tablet, Rfl: 1   Metoprolol Succinate 25 MG CS24, Take 25 mg by mouth  daily as needed. As needed for afib., Disp: , Rfl:    metoprolol tartrate (LOPRESSOR) 25 MG tablet, TAKE 0.5 TABLET TO 1 TABLET BY MOUTH TWICE DAILY AS NEEDED FOR PALPITAIONS, Disp: 60 tablet, Rfl: 2   montelukast (SINGULAIR) 10 MG tablet, TAKE ONE TABLET AT BEDTIME, Disp: 90 tablet, Rfl: 3   omeprazole (PRILOSEC) 40 MG capsule, TAKE 1 CAPSULE BY MOUTH DAILY., Disp: 30 capsule, Rfl: 3   ondansetron (ZOFRAN ODT) 4 MG disintegrating tablet, Take 1 tablet (4 mg total) by mouth every 8 (eight) hours as needed for nausea or vomiting., Disp: 20 tablet, Rfl: 0   OVER THE  COUNTER MEDICATION, Take 1,500 mg by mouth daily. Calcium Pyurbate, Disp: , Rfl:    Semaglutide, 1 MG/DOSE, (OZEMPIC, 1 MG/DOSE,) 4 MG/3ML SOPN, Inject 1 mg into the skin once a week., Disp: 3 mL, Rfl: 2   zolpidem (AMBIEN) 10 MG tablet, TAKE 1 TABLET BY MOUTH AT BEDTIME AS NEEDED FOR SLEEP., Disp: 30 tablet, Rfl: 5   Objective:     Vitals:   12/10/20 1029  BP: 110/70  Pulse: 81  SpO2: 98%  Weight: 215 lb (97.5 kg)  Height: 5\' 9"  (1.753 m)      Body mass index is 31.75 kg/m.    Physical Exam:    Gen: Appears well, nad, nontoxic and pleasant Psych: Alert and oriented, appropriate mood and affect Neuro: sensation intact, strength is 5/5 with df/pf/inv/ev, muscle tone wnl Skin: no susupicious lesions or rashes  Left ankle: no deformity, no swelling or effusion TTP base of calcaneus Pain with calcaneal compression NTTP over fibular head, lat mal, medial mal, achilles, navicular, base of 5th, ATFL, CFL, deltoid or midfoot ROM DF 30, PF 45, inv/ev intact Negative ant drawer, talar tilt, rotation test, squeeze test. Neg thompson No pain with resisted inversion or eversion  No tenderness along plantar fascia  Electronically signed by:  Patricia [name] D.[facility] 11:04 AM 12/10/20

## 2020-12-10 ENCOUNTER — Other Ambulatory Visit: Payer: Self-pay

## 2020-12-10 ENCOUNTER — Ambulatory Visit (INDEPENDENT_AMBULATORY_CARE_PROVIDER_SITE_OTHER): Payer: 59 | Admitting: Sports Medicine

## 2020-12-10 ENCOUNTER — Other Ambulatory Visit (INDEPENDENT_AMBULATORY_CARE_PROVIDER_SITE_OTHER): Payer: 59

## 2020-12-10 ENCOUNTER — Ambulatory Visit (INDEPENDENT_AMBULATORY_CARE_PROVIDER_SITE_OTHER): Payer: 59

## 2020-12-10 VITALS — BP 110/70 | HR 81 | Ht 69.0 in | Wt 215.0 lb

## 2020-12-10 DIAGNOSIS — E1169 Type 2 diabetes mellitus with other specified complication: Secondary | ICD-10-CM

## 2020-12-10 DIAGNOSIS — E538 Deficiency of other specified B group vitamins: Secondary | ICD-10-CM

## 2020-12-10 DIAGNOSIS — M79672 Pain in left foot: Secondary | ICD-10-CM | POA: Diagnosis not present

## 2020-12-10 DIAGNOSIS — E669 Obesity, unspecified: Secondary | ICD-10-CM | POA: Diagnosis not present

## 2020-12-10 DIAGNOSIS — R232 Flushing: Secondary | ICD-10-CM | POA: Diagnosis not present

## 2020-12-10 DIAGNOSIS — E1159 Type 2 diabetes mellitus with other circulatory complications: Secondary | ICD-10-CM | POA: Diagnosis not present

## 2020-12-10 DIAGNOSIS — E079 Disorder of thyroid, unspecified: Secondary | ICD-10-CM

## 2020-12-10 DIAGNOSIS — I152 Hypertension secondary to endocrine disorders: Secondary | ICD-10-CM | POA: Diagnosis not present

## 2020-12-10 NOTE — Patient Instructions (Addendum)
Good to see you recommend to get padded insert in her shoe Can continue tylenol and Celebrex for pain relief  Follow up as needed

## 2020-12-11 LAB — THYROID PANEL WITH TSH
Free Thyroxine Index: 1.9 (ref 1.4–3.8)
T3 Uptake: 25 % (ref 22–35)
T4, Total: 7.7 ug/dL (ref 5.1–11.9)
TSH: 0.82 mIU/L

## 2020-12-11 LAB — FOLLICLE STIMULATING HORMONE: FSH: 17.7 m[IU]/mL

## 2020-12-11 LAB — COMPREHENSIVE METABOLIC PANEL
ALT: 13 U/L (ref 0–35)
AST: 14 U/L (ref 0–37)
Albumin: 3.9 g/dL (ref 3.5–5.2)
Alkaline Phosphatase: 53 U/L (ref 39–117)
BUN: 8 mg/dL (ref 6–23)
CO2: 28 mEq/L (ref 19–32)
Calcium: 8.6 mg/dL (ref 8.4–10.5)
Chloride: 100 mEq/L (ref 96–112)
Creatinine, Ser: 0.73 mg/dL (ref 0.40–1.20)
GFR: 95.42 mL/min (ref >60.00)
Glucose, Bld: 96 mg/dL (ref 70–99)
Potassium: 3.5 mEq/L (ref 3.5–5.1)
Sodium: 136 mEq/L (ref 135–145)
Total Bilirubin: 0.4 mg/dL (ref 0.2–1.2)
Total Protein: 6.8 g/dL (ref 6.0–8.3)

## 2020-12-11 LAB — HEMOGLOBIN A1C: Hgb A1c MFr Bld: 6.4 % (ref 4.6–6.5)

## 2020-12-11 LAB — VITAMIN B12: Vitamin B-12: 585 pg/mL (ref 211–911)

## 2020-12-13 LAB — TESTOS,TOTAL,FREE AND SHBG (FEMALE)
Free Testosterone: 15.1 pg/mL — ABNORMAL HIGH (ref 0.1–6.4)
Sex Hormone Binding: 84 nmol/L (ref 17–124)
Testosterone, Total, LC-MS-MS: 201 ng/dL — ABNORMAL HIGH (ref 2–45)

## 2020-12-30 ENCOUNTER — Other Ambulatory Visit: Payer: Self-pay | Admitting: Internal Medicine

## 2020-12-30 ENCOUNTER — Encounter: Payer: Self-pay | Admitting: Internal Medicine

## 2020-12-30 MED ORDER — SEMAGLUTIDE (2 MG/DOSE) 8 MG/3ML ~~LOC~~ SOPN: 2.0000 mg | PEN_INJECTOR | SUBCUTANEOUS | 2 refills | Status: DC

## 2021-01-04 ENCOUNTER — Other Ambulatory Visit: Payer: Self-pay | Admitting: Internal Medicine

## 2021-01-04 ENCOUNTER — Encounter: Payer: Self-pay | Admitting: Internal Medicine

## 2021-01-04 DIAGNOSIS — U071 COVID-19: Secondary | ICD-10-CM | POA: Insufficient documentation

## 2021-01-04 HISTORY — DX: COVID-19: U07.1

## 2021-01-04 MED ORDER — MOLNUPIRAVIR EUA 200MG CAPSULE: 4.0000 | ORAL_CAPSULE | Freq: Two times a day (BID) | ORAL | 0 refills | Status: AC

## 2021-01-07 ENCOUNTER — Ambulatory Visit: Payer: 59 | Admitting: Family Medicine
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
## 2021-01-21 ENCOUNTER — Ambulatory Visit (INDEPENDENT_AMBULATORY_CARE_PROVIDER_SITE_OTHER): Payer: 59

## 2021-01-21 ENCOUNTER — Ambulatory Visit (INDEPENDENT_AMBULATORY_CARE_PROVIDER_SITE_OTHER): Payer: 59 | Admitting: Family Medicine

## 2021-01-21 ENCOUNTER — Ambulatory Visit: Payer: 59 | Admitting: Family Medicine

## 2021-01-21 ENCOUNTER — Other Ambulatory Visit: Payer: Self-pay

## 2021-01-21 VITALS — BP 116/74 | HR 91 | Ht 69.0 in | Wt 215.0 lb

## 2021-01-21 DIAGNOSIS — M9904 Segmental and somatic dysfunction of sacral region: Secondary | ICD-10-CM

## 2021-01-21 DIAGNOSIS — M79671 Pain in right foot: Secondary | ICD-10-CM | POA: Diagnosis not present

## 2021-01-21 DIAGNOSIS — M9908 Segmental and somatic dysfunction of rib cage: Secondary | ICD-10-CM | POA: Diagnosis not present

## 2021-01-21 DIAGNOSIS — M9901 Segmental and somatic dysfunction of cervical region: Secondary | ICD-10-CM

## 2021-01-21 DIAGNOSIS — M722 Plantar fascial fibromatosis: Secondary | ICD-10-CM

## 2021-01-21 DIAGNOSIS — M9902 Segmental and somatic dysfunction of thoracic region: Secondary | ICD-10-CM

## 2021-01-21 DIAGNOSIS — M9903 Segmental and somatic dysfunction of lumbar region: Secondary | ICD-10-CM

## 2021-01-21 DIAGNOSIS — M5412 Radiculopathy, cervical region: Secondary | ICD-10-CM | POA: Diagnosis not present

## 2021-01-21 NOTE — Assessment & Plan Note (Signed)
Radicular symptoms.  Does have a chronic exacerbation from time to time as well.  Discussed icing regimen and home exercises.  Discussed which activities to do which wants to avoid.  Patient does have the Celebrex and the gabapentin.  Flexeril as well.  Follow-up again in 6 to 8 weeks.

## 2021-01-21 NOTE — Patient Instructions (Addendum)
Xray today Spenco total orthotics Do prescribed exercises at least 3x a week Heel Lifts [place] recovery sandals See you again in 6 weeks

## 2021-01-21 NOTE — Assessment & Plan Note (Signed)
We reviewed that stretching is critically important to the treatment of PF.  Reviewed footwear. Rigid soles have been shown to help with PF. Night splints can sometimes help. Reviewed rehab of stretching and calf raises.   Could benefit from a corticosteroid injection, orthotics, or other measures if conservative treatment fails. Encourage patient to get some over-the-counter orthotics as well as some other avoiding being barefoot.  Follow-up again in 6 weeks

## 2021-01-22 ENCOUNTER — Encounter: Payer: Self-pay | Admitting: Family Medicine

## 2021-02-15 ENCOUNTER — Other Ambulatory Visit: Payer: Self-pay | Admitting: Internal Medicine

## 2021-02-15 MED ORDER — CELECOXIB 200 MG PO CAPS: 200.0000 mg | ORAL_CAPSULE | Freq: Two times a day (BID) | ORAL | 0 refills | Status: DC

## 2021-02-16 ENCOUNTER — Encounter: Payer: Self-pay | Admitting: Family Medicine

## 2021-02-23 ENCOUNTER — Encounter: Payer: Self-pay | Admitting: Internal Medicine
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
## 2021-02-25 ENCOUNTER — Ambulatory Visit: Payer: Self-pay

## 2021-02-25 ENCOUNTER — Ambulatory Visit: Payer: No Typology Code available for payment source | Admitting: Family Medicine

## 2021-02-25 ENCOUNTER — Other Ambulatory Visit: Payer: Self-pay

## 2021-02-25 VITALS — BP 110/80 | HR 72 | Ht 69.0 in | Wt 211.0 lb

## 2021-02-25 DIAGNOSIS — M722 Plantar fascial fibromatosis: Secondary | ICD-10-CM

## 2021-02-25 DIAGNOSIS — M9902 Segmental and somatic dysfunction of thoracic region: Secondary | ICD-10-CM

## 2021-02-25 DIAGNOSIS — M5136 Other intervertebral disc degeneration, lumbar region: Secondary | ICD-10-CM

## 2021-02-25 DIAGNOSIS — M9903 Segmental and somatic dysfunction of lumbar region: Secondary | ICD-10-CM | POA: Diagnosis not present

## 2021-02-25 DIAGNOSIS — M9905 Segmental and somatic dysfunction of pelvic region: Secondary | ICD-10-CM

## 2021-02-25 DIAGNOSIS — M51369 Other intervertebral disc degeneration, lumbar region without mention of lumbar back pain or lower extremity pain: Secondary | ICD-10-CM

## 2021-02-25 DIAGNOSIS — M9908 Segmental and somatic dysfunction of rib cage: Secondary | ICD-10-CM

## 2021-02-25 DIAGNOSIS — M9904 Segmental and somatic dysfunction of sacral region: Secondary | ICD-10-CM | POA: Diagnosis not present

## 2021-02-25 MED ORDER — LEVOTHYROXINE SODIUM 50 MCG PO TABS: 50.0000 ug | ORAL_TABLET | Freq: Every day | ORAL | 3 refills | Status: DC

## 2021-02-25 MED ORDER — CYANOCOBALAMIN 1000 MCG/ML IJ SOLN: 1000.0000 ug | INTRAMUSCULAR | 3 refills | Status: DC

## 2021-02-25 MED ORDER — PROGESTERONE 200 MG PO CAPS: 200.0000 mg | ORAL_CAPSULE | Freq: Every day | ORAL | 1 refills | Status: DC

## 2021-02-25 MED ORDER — SEMAGLUTIDE-WEIGHT MANAGEMENT 2.4 MG/0.75ML ~~LOC~~ SOAJ: 2.4000 mg | SUBCUTANEOUS | 2 refills | Status: DC

## 2021-02-25 NOTE — Assessment & Plan Note (Signed)
Degenerative disc disease.  Patient noted does respond relatively well though to osteopathic manipulation.  We will continue the same regimen at this time.  Patient has been working on losing weight and encouraged her to continue to do so.  Follow-up with me again in 6 to 8 weeks.

## 2021-02-25 NOTE — Assessment & Plan Note (Signed)
Patient given injection and tolerated procedure well, discussed icing regimen and home exercises, discussed which activities to do which wants to avoid.  Discussed that patient does have moderate heel spur that could also be contributing and may need surgical intervention if patient does not make significant improvement.  Patient is in agreement with the plan at the moment.

## 2021-02-25 NOTE — Patient Instructions (Addendum)
Injection today Start taking Synthroid daily Increase activity slowly, but give it the weekend See you again in 6 weeks

## 2021-03-09 ENCOUNTER — Encounter: Payer: Self-pay | Admitting: Internal Medicine

## 2021-04-06 ENCOUNTER — Other Ambulatory Visit: Payer: Self-pay | Admitting: Internal Medicine
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
## 2021-04-08 ENCOUNTER — Ambulatory Visit: Payer: No Typology Code available for payment source | Admitting: Family Medicine

## 2021-04-08 VITALS — BP 132/90 | HR 89 | Ht 69.0 in | Wt 204.0 lb

## 2021-04-08 DIAGNOSIS — M9902 Segmental and somatic dysfunction of thoracic region: Secondary | ICD-10-CM

## 2021-04-08 DIAGNOSIS — M9901 Segmental and somatic dysfunction of cervical region: Secondary | ICD-10-CM | POA: Diagnosis not present

## 2021-04-08 DIAGNOSIS — M9908 Segmental and somatic dysfunction of rib cage: Secondary | ICD-10-CM | POA: Diagnosis not present

## 2021-04-08 DIAGNOSIS — M5414 Radiculopathy, thoracic region: Secondary | ICD-10-CM

## 2021-04-08 DIAGNOSIS — M9904 Segmental and somatic dysfunction of sacral region: Secondary | ICD-10-CM | POA: Diagnosis not present

## 2021-04-08 DIAGNOSIS — M9903 Segmental and somatic dysfunction of lumbar region: Secondary | ICD-10-CM | POA: Diagnosis not present

## 2021-04-08 NOTE — Patient Instructions (Signed)
Good to see you! ?Good luck with pollen season ?Congrats on new dig ?See you again in 7-8 weeks ?

## 2021-04-08 NOTE — Assessment & Plan Note (Signed)
Patient has more tightness in the thoracolumbar juncture than usual.  Responded extremely well though to osteopathic manipulation.  Patient is to increase activity slowly otherwise.  Follow-up again in 6 to 8 weeks. ?

## 2021-04-22 ENCOUNTER — Other Ambulatory Visit: Payer: Self-pay | Admitting: Internal Medicine

## 2021-04-26 ENCOUNTER — Encounter: Payer: Self-pay | Admitting: Internal Medicine

## 2021-04-29 NOTE — Telephone Encounter (Signed)
PA has been submitted.

## 2021-05-12 ENCOUNTER — Other Ambulatory Visit: Payer: Self-pay | Admitting: Family Medicine

## 2021-05-12 ENCOUNTER — Encounter: Payer: Self-pay | Admitting: Family Medicine

## 2021-05-12 DIAGNOSIS — M722 Plantar fascial fibromatosis: Secondary | ICD-10-CM

## 2021-05-13 ENCOUNTER — Telehealth: Payer: Self-pay | Admitting: Internal Medicine

## 2021-05-13 NOTE — Telephone Encounter (Signed)
Pt came into office to drop off [employer] form for the provider to fill out. Placed in color provider ?

## 2021-05-18 NOTE — Telephone Encounter (Signed)
Filled out and placed in quick sign folder.  ?

## 2021-05-19 NOTE — Telephone Encounter (Signed)
Faxed
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
## 2021-05-26 ENCOUNTER — Ambulatory Visit: Payer: Self-pay

## 2021-05-26 ENCOUNTER — Ambulatory Visit: Payer: 59 | Admitting: Family Medicine

## 2021-05-26 VITALS — BP 130/82 | HR 92 | Ht 69.0 in | Wt 208.0 lb

## 2021-05-26 DIAGNOSIS — M9903 Segmental and somatic dysfunction of lumbar region: Secondary | ICD-10-CM | POA: Diagnosis not present

## 2021-05-26 DIAGNOSIS — M5136 Other intervertebral disc degeneration, lumbar region: Secondary | ICD-10-CM

## 2021-05-26 DIAGNOSIS — M76822 Posterior tibial tendinitis, left leg: Secondary | ICD-10-CM

## 2021-05-26 DIAGNOSIS — M9904 Segmental and somatic dysfunction of sacral region: Secondary | ICD-10-CM | POA: Diagnosis not present

## 2021-05-26 DIAGNOSIS — M9902 Segmental and somatic dysfunction of thoracic region: Secondary | ICD-10-CM

## 2021-05-26 DIAGNOSIS — M79672 Pain in left foot: Secondary | ICD-10-CM | POA: Diagnosis not present

## 2021-05-26 NOTE — Assessment & Plan Note (Signed)
Patient given injection for diagnostic and potentially therapeutic purposes.  We discussed with patient that there is a possibility that if this does not work we do need advanced imaging such as an MRI.  Tarsal tunnel syndrome is within the differential as well with patient having some numbness in the toe from time to time.  Follow-up again otherwise in 6 weeks. ?

## 2021-05-26 NOTE — Patient Instructions (Addendum)
Injected foot today ?If not better in a few weeks can get MRI ?See me again in 6-8 weeks ?Send message after memorial day ?

## 2021-05-26 NOTE — Assessment & Plan Note (Signed)
Chronic but stable overall.  Still responding well to the osteopathic manipulation.  Has muscle relaxers for breakthrough.  We discussed icing regimen and home exercises.  Follow-up again in 6 to 8 weeks ?

## 2021-06-02 ENCOUNTER — Ambulatory Visit (INDEPENDENT_AMBULATORY_CARE_PROVIDER_SITE_OTHER): Payer: 59 | Admitting: Internal Medicine

## 2021-06-02 ENCOUNTER — Encounter: Payer: Self-pay | Admitting: Internal Medicine

## 2021-06-02 ENCOUNTER — Other Ambulatory Visit (HOSPITAL_COMMUNITY)
Admission: RE | Admit: 2021-06-02 | Discharge: 2021-06-02 | Disposition: A | Discharge status: Home / Self Care | Payer: 59 | Source: Ambulatory Visit | Attending: Internal Medicine | Admitting: Internal Medicine

## 2021-06-02 VITALS — BP 132/80 | HR 86 | Temp 98.0°F | Ht 69.0 in | Wt 205.6 lb

## 2021-06-02 DIAGNOSIS — Z124 Encounter for screening for malignant neoplasm of cervix: Secondary | ICD-10-CM | POA: Insufficient documentation

## 2021-06-02 DIAGNOSIS — E1169 Type 2 diabetes mellitus with other specified complication: Secondary | ICD-10-CM | POA: Diagnosis not present

## 2021-06-02 DIAGNOSIS — G2579 Other drug induced movement disorders: Secondary | ICD-10-CM

## 2021-06-02 DIAGNOSIS — E669 Obesity, unspecified: Secondary | ICD-10-CM | POA: Diagnosis not present

## 2021-06-02 DIAGNOSIS — E1159 Type 2 diabetes mellitus with other circulatory complications: Secondary | ICD-10-CM | POA: Diagnosis not present

## 2021-06-02 DIAGNOSIS — E079 Disorder of thyroid, unspecified: Secondary | ICD-10-CM

## 2021-06-02 DIAGNOSIS — I152 Hypertension secondary to endocrine disorders: Secondary | ICD-10-CM | POA: Diagnosis not present

## 2021-06-02 DIAGNOSIS — G9081 Serotonin syndrome: Secondary | ICD-10-CM

## 2021-06-02 DIAGNOSIS — Z1231 Encounter for screening mammogram for malignant neoplasm of breast: Secondary | ICD-10-CM

## 2021-06-02 DIAGNOSIS — R69 Illness, unspecified: Secondary | ICD-10-CM | POA: Diagnosis not present

## 2021-06-02 DIAGNOSIS — F5102 Adjustment insomnia: Secondary | ICD-10-CM

## 2021-06-02 DIAGNOSIS — F411 Generalized anxiety disorder: Secondary | ICD-10-CM

## 2021-06-02 DIAGNOSIS — Z0000 Encounter for general adult medical examination without abnormal findings: Secondary | ICD-10-CM

## 2021-06-02 DIAGNOSIS — F430 Acute stress reaction: Secondary | ICD-10-CM

## 2021-06-02 DIAGNOSIS — N951 Menopausal and female climacteric states: Secondary | ICD-10-CM

## 2021-06-02 DIAGNOSIS — R87811 Vaginal high risk human papillomavirus (HPV) DNA test positive: Secondary | ICD-10-CM

## 2021-06-02 DIAGNOSIS — E119 Type 2 diabetes mellitus without complications: Secondary | ICD-10-CM

## 2021-06-02 DIAGNOSIS — E785 Hyperlipidemia, unspecified: Secondary | ICD-10-CM | POA: Diagnosis not present

## 2021-06-02 MED ORDER — ESZOPICLONE 3 MG PO TABS: 3.0000 mg | ORAL_TABLET | Freq: Every day | ORAL | 0 refills | Status: DC

## 2021-06-02 NOTE — Progress Notes (Unsigned)
The patient is here for annual preventive examination and management of other chronic and acute problems.   The risk factors are reflected in the social history.   The roster of all physicians providing medical care to patient - is listed in the Snapshot section of the chart.   Activities of daily living:  The patient is 100% independent in all ADLs: dressing, toileting, feeding as well as independent mobility   Home safety : The patient has smoke detectors in the home. They wear seatbelts.  There are no unsecured firearms at home. There is no violence in the home.    There is no risks for hepatitis, STDs or HIV. There is no   history of blood transfusion. They have no travel history to infectious disease endemic areas of the world.   The patient has seen their dentist in the last six month. They have seen their eye doctor in the last year. The patinet  denies slight hearing difficulty with regard to whispered voices and some television programs.  They have deferred audiologic testing in the last year.  They do not  have excessive sun exposure. Discussed the need for sun protection: hats, long sleeves and use of sunscreen if there is significant sun exposure.    Diet: the importance of a healthy diet is discussed. They do have a healthy diet.   The benefits of regular aerobic exercise were discussed. The patient  exercises  3 to 5 days per week  for  60 minutes.    Depression screen: there are no signs or vegative symptoms of depression- irritability, change in appetite, anhedonia, sadness/tearfullness.   The following portions of the patient's history were reviewed and updated as appropriate: allergies, current medications, past family history, past medical history,  past surgical history, past social history  and problem list.   Visual acuity was not assessed per patient preference since the patient has regular follow up with an  ophthalmologist. Hearing and body mass index were assessed and  reviewed.    During the course of the visit the patient was educated and counseled about appropriate screening and preventive services including : fall prevention , diabetes screening, nutrition counseling, colorectal cancer screening, and recommended immunizations.    Chief Complaint:  1) T2DM with obesity;  lost 35 lbs on ozempic /wegovy,  can no longer get wegovy  out x 4 weeks and gained 5 lbs.  Previous etformi ntrial caused persistent diarrhea.   2)insomnia; ambien dependent . Wants to get off ambien tiral fo lunesta 3 mg dose   Review of Symptoms  Patient denies headache, fevers, malaise, unintentional weight loss, skin rash, eye pain, sinus congestion and sinus pain, sore throat, dysphagia,  hemoptysis , cough, dyspnea, wheezing, chest pain, palpitations, orthopnea, edema, abdominal pain, nausea, melena, diarrhea, constipation, flank pain, dysuria, hematuria, urinary  Frequency, nocturia, numbness, tingling, seizures,  Focal weakness, Loss of consciousness,  Tremor, insomnia, depression, anxiety, and suicidal ideation.    Physical Exam:  BP 132/80 (BP Location: Left Arm, Patient Position: Sitting, Cuff Size: Large)   Pulse 86   Temp 98 F (36.7 C) (Oral)   Ht '5\' 9"'$  (1.753 m)   Wt 205 lb 9.6 oz (93.3 kg)   SpO2 98%   BMI 30.36 kg/m      Assessment and Plan:  No problem-specific Assessment & Plan notes found for this encounter.   Updated Medication List Outpatient Encounter Medications as of 06/02/2021  Medication Sig   acetaminophen (TYLENOL) 500 MG tablet Take  500 mg by mouth every 6 (six) hours as needed. Taking 3,000 mg daily   aspirin EC 81 MG tablet Take 81 mg by mouth daily. Swallow whole.   Biotin 1000 MCG tablet Take 1,000 mcg by mouth daily.   butalbital-acetaminophen-caffeine (FIORICET) 50-325-40 MG tablet TAKE ONE TABLET BY MOUTH EVERY 6 HOURS AS NEEDED FOR PAIN   celecoxib (CELEBREX) 200 MG capsule TAKE 1 CAPSULE BY MOUTH 2 TIMES DAILY.   Cholecalciferol  (VITAMIN D) 50 MCG (2000 UT) tablet Take 2,000 Units by mouth daily.   cyanocobalamin (,VITAMIN B-12,) 1000 MCG/ML injection Inject 1 mL (1,000 mcg total) into the muscle every 30 (thirty) days.   cyclobenzaprine (FLEXERIL) 10 MG tablet Take 1 tablet (10 mg total) by mouth 3 (three) times daily as needed for muscle spasms.   fluticasone (FLONASE) 50 MCG/ACT nasal spray SPRAY TWICE INTO EACH NOSTRIL EVERY DAY   gabapentin (NEURONTIN) 100 MG capsule Take 1 capsule (100 mg total) by mouth at bedtime. USING 300 MG AT BEDTIME WITH OTHER RX   levothyroxine (SYNTHROID) 50 MCG tablet Take 1 tablet (50 mcg total) by mouth daily.   lidocaine (LIDODERM) 5 % Place 1 patch onto the skin daily. Remove & Discard patch within 12 hours or as directed by MD   loratadine (CLARITIN) 10 MG tablet Take 1 tablet (10 mg total) by mouth daily.   losartan-hydrochlorothiazide (HYZAAR) 100-25 MG tablet TAKE 1 TABLET BY MOUTH DAILY   montelukast (SINGULAIR) 10 MG tablet TAKE ONE TABLET AT BEDTIME   omeprazole (PRILOSEC) 40 MG capsule TAKE 1 CAPSULE BY MOUTH DAILY.   ondansetron (ZOFRAN ODT) 4 MG disintegrating tablet Take 1 tablet (4 mg total) by mouth every 8 (eight) hours as needed for nausea or vomiting.   OVER THE COUNTER MEDICATION Take 1,500 mg by mouth daily. Calcium Pyurbate   progesterone (PROMETRIUM) 200 MG capsule Take 1 capsule (200 mg total) by mouth daily.   zolpidem (AMBIEN) 10 MG tablet TAKE 1 TABLET BY MOUTH AT BEDTIME AS NEEDED FOR SLEEP.   [DISCONTINUED] Metoprolol Succinate 25 MG CS24 Take 25 mg by mouth daily as needed. As needed for afib.   metoprolol tartrate (LOPRESSOR) 25 MG tablet TAKE 0.5 TABLET TO 1 TABLET BY MOUTH TWICE DAILY AS NEEDED FOR PALPITAIONS (Patient not taking: Reported on 06/02/2021)   Semaglutide-Weight Management 2.4 MG/0.75ML SOAJ Inject 2.4 mg into the skin once a week. (Patient not taking: Reported on 06/02/2021)   [DISCONTINUED] Semaglutide, 1 MG/DOSE, (OZEMPIC, 1 MG/DOSE,) 4 MG/3ML  SOPN Inject 1 mg into the skin once a week.   [DISCONTINUED] Semaglutide, 2 MG/DOSE, 8 MG/3ML SOPN Inject 2 mg as directed once a week.   No facility-administered encounter medications on file as of 06/02/2021.

## 2021-06-02 NOTE — Patient Instructions (Signed)
Trial of Lunesta 3 mg dose instead of Ambien  PA for ozempic 0.5 mg dose has been submitted

## 2021-06-02 NOTE — Assessment & Plan Note (Addendum)
She lost 35 lbs with Wegovy,  But for the last month has been unable to get  [facility] despite diagnosis of T2DM.    Trial of metformin XR in 2018 caused uncontrollable diarrhea. Has been off of wegovy since April 25  And has gained 5 lbs since then .  Will need to start on lower dose of 0.5 mg two avoid adverse effects.  Unable to exercise due to plntar fasciitis and posterior  tibial tendonitis

## 2021-06-03 ENCOUNTER — Encounter: Payer: Self-pay | Admitting: Internal Medicine

## 2021-06-03 NOTE — Assessment & Plan Note (Signed)
We discussed alternatives to [place].  Trial of lunesta at 3 mg dose

## 2021-06-03 NOTE — Assessment & Plan Note (Signed)

## 2021-06-03 NOTE — Assessment & Plan Note (Signed)
Improved with job change and reductio of dose of wellbutrin to 150 mg daily .

## 2021-06-03 NOTE — Assessment & Plan Note (Addendum)
Last imaging of thyroid nodules was in 2017 at which time they had shrunk.  previous biopsy was normal, thyroid function has been normal  Lab Results  Component Value Date   TSH 0.82 12/10/2020

## 2021-06-03 NOTE — Assessment & Plan Note (Addendum)
Secondary to use of  Venlafaxine

## 2021-06-03 NOTE — Assessment & Plan Note (Signed)
She would like to Continue ozempic for management oftype 2 diabetes and obesity,  Awaiting PA.   she prefers to avoid metformin given her past trial causing persistent diarrhea.   Continue ARB,  Will also need to discuss adding statin pending review of current labs  Lab Results  Component Value Date   HGBA1C 6.4 12/10/2020   Lab Results  Component Value Date   LABMICR Comment 01/07/2016   MICROALBUR 0.9 06/04/2020   MICROALBUR <0.7 08/16/2019    Lab Results  Component Value Date   CHOL 216 (H) 06/04/2020   HDL 43.60 06/04/2020   LDLCALC 133 (H) 06/04/2020   LDLDIRECT 116.0 06/07/2017   TRIG 199.0 (H) 06/04/2020   CHOLHDL 5 06/04/2020

## 2021-06-03 NOTE — Assessment & Plan Note (Signed)
PAP smear of vaginal cuff repeated today

## 2021-06-04 LAB — CYTOLOGY - PAP
Comment: NEGATIVE
Diagnosis: NEGATIVE
High risk HPV: NEGATIVE

## 2021-06-11 ENCOUNTER — Telehealth: Payer: Self-pay

## 2021-06-11 NOTE — Telephone Encounter (Signed)
PA for Patricia [name] has been submitted and approved.

## 2021-06-11 NOTE — Telephone Encounter (Signed)
PA for Ozempic has been resubmitted on covermymeds.

## 2021-06-14 ENCOUNTER — Other Ambulatory Visit: Payer: 59

## 2021-06-15 ENCOUNTER — Telehealth: Payer: Self-pay

## 2021-06-15 NOTE — Telephone Encounter (Signed)
PA for Patricia [name] has been approved.

## 2021-06-18 DIAGNOSIS — M6281 Muscle weakness (generalized): Secondary | ICD-10-CM | POA: Diagnosis not present

## 2021-06-18 DIAGNOSIS — M25675 Stiffness of left foot, not elsewhere classified: Secondary | ICD-10-CM | POA: Diagnosis not present

## 2021-06-18 DIAGNOSIS — M25672 Stiffness of left ankle, not elsewhere classified: Secondary | ICD-10-CM | POA: Diagnosis not present

## 2021-06-18 DIAGNOSIS — M25572 Pain in left ankle and joints of left foot: Secondary | ICD-10-CM | POA: Diagnosis not present

## 2021-07-05 ENCOUNTER — Other Ambulatory Visit: Payer: Self-pay | Admitting: Internal Medicine

## 2021-07-06 ENCOUNTER — Other Ambulatory Visit (INDEPENDENT_AMBULATORY_CARE_PROVIDER_SITE_OTHER): Payer: 59

## 2021-07-06 DIAGNOSIS — N951 Menopausal and female climacteric states: Secondary | ICD-10-CM

## 2021-07-06 DIAGNOSIS — E1159 Type 2 diabetes mellitus with other circulatory complications: Secondary | ICD-10-CM

## 2021-07-06 DIAGNOSIS — I152 Hypertension secondary to endocrine disorders: Secondary | ICD-10-CM | POA: Diagnosis not present

## 2021-07-06 DIAGNOSIS — E669 Obesity, unspecified: Secondary | ICD-10-CM

## 2021-07-06 DIAGNOSIS — E1169 Type 2 diabetes mellitus with other specified complication: Secondary | ICD-10-CM

## 2021-07-06 LAB — COMPREHENSIVE METABOLIC PANEL
ALT: 12 U/L (ref 0–35)
AST: 12 U/L (ref 0–37)
Albumin: 3.7 g/dL (ref 3.5–5.2)
Alkaline Phosphatase: 56 U/L (ref 39–117)
BUN: 11 mg/dL (ref 6–23)
CO2: 28 mEq/L (ref 19–32)
Calcium: 8.7 mg/dL (ref 8.4–10.5)
Chloride: 102 mEq/L (ref 96–112)
Creatinine, Ser: 0.65 mg/dL (ref 0.40–1.20)
GFR: 101.76 mL/min (ref >60.00)
Glucose, Bld: 106 mg/dL — ABNORMAL HIGH (ref 70–99)
Potassium: 4.1 mEq/L (ref 3.5–5.1)
Sodium: 137 mEq/L (ref 135–145)
Total Bilirubin: 0.5 mg/dL (ref 0.2–1.2)
Total Protein: 6.6 g/dL (ref 6.0–8.3)

## 2021-07-06 LAB — LIPID PANEL
Cholesterol: 188 mg/dL (ref 0–200)
HDL: 48.8 mg/dL (ref >39.00)
LDL Cholesterol: 115 mg/dL — ABNORMAL HIGH (ref 0–99)
NonHDL: 138.96
Total CHOL/HDL Ratio: 4
Triglycerides: 119 mg/dL (ref 0.0–149.0)
VLDL: 23.8 mg/dL (ref 0.0–40.0)

## 2021-07-06 LAB — FOLLICLE STIMULATING HORMONE: FSH: 12.6 m[IU]/mL

## 2021-07-06 LAB — MICROALBUMIN / CREATININE URINE RATIO
Creatinine,U: 21 mg/dL
Microalb Creat Ratio: 3.3 mg/g (ref 0.0–30.0)
Microalb, Ur: <0.7 mg/dL (ref 0.0–1.9)

## 2021-07-06 LAB — HEMOGLOBIN A1C: Hgb A1c MFr Bld: 6.3 % (ref 4.6–6.5)

## 2021-07-06 MED ORDER — ESZOPICLONE 2 MG PO TABS: 2.0000 mg | ORAL_TABLET | Freq: Every evening | ORAL | 2 refills | Status: DC | PRN

## 2021-07-07 ENCOUNTER — Encounter: Payer: Self-pay | Admitting: Internal Medicine
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
## 2021-07-08 ENCOUNTER — Ambulatory Visit: Payer: 59 | Admitting: Family Medicine

## 2021-07-08 ENCOUNTER — Ambulatory Visit: Payer: Self-pay

## 2021-07-08 VITALS — BP 114/76 | HR 90 | Ht 69.0 in | Wt 212.0 lb

## 2021-07-08 DIAGNOSIS — M722 Plantar fascial fibromatosis: Secondary | ICD-10-CM | POA: Diagnosis not present

## 2021-07-08 DIAGNOSIS — M9902 Segmental and somatic dysfunction of thoracic region: Secondary | ICD-10-CM

## 2021-07-08 DIAGNOSIS — M79672 Pain in left foot: Secondary | ICD-10-CM | POA: Diagnosis not present

## 2021-07-08 DIAGNOSIS — M9904 Segmental and somatic dysfunction of sacral region: Secondary | ICD-10-CM | POA: Diagnosis not present

## 2021-07-08 DIAGNOSIS — M9903 Segmental and somatic dysfunction of lumbar region: Secondary | ICD-10-CM | POA: Diagnosis not present

## 2021-07-08 DIAGNOSIS — M9908 Segmental and somatic dysfunction of rib cage: Secondary | ICD-10-CM

## 2021-07-08 DIAGNOSIS — M5136 Other intervertebral disc degeneration, lumbar region: Secondary | ICD-10-CM

## 2021-07-08 DIAGNOSIS — M9901 Segmental and somatic dysfunction of cervical region: Secondary | ICD-10-CM

## 2021-07-08 NOTE — Patient Instructions (Signed)
Arch compression See me again in 6 weeks

## 2021-07-09 ENCOUNTER — Other Ambulatory Visit: Payer: Self-pay | Admitting: Internal Medicine

## 2021-07-09 MED ORDER — LOSARTAN POTASSIUM-HCTZ 100-25 MG PO TABS: 1.0000 | ORAL_TABLET | Freq: Every day | ORAL | 1 refills | Status: DC

## 2021-07-09 NOTE — Assessment & Plan Note (Signed)
Chronic with worsenign, increase tightness, difficulty with many meds, doing ok with the celebrex or PRN NSAIDs, flexeril  Respond to OMT.  RTC in 6-8 weeks

## 2021-07-09 NOTE — Assessment & Plan Note (Signed)
Seems more like plantar nerve, may need to consider PTT injection, discussed heel or arch support  rtc in 8 weeks  Worsening could consider advance imaging

## 2021-07-20 DIAGNOSIS — M6281 Muscle weakness (generalized): Secondary | ICD-10-CM | POA: Diagnosis not present

## 2021-07-20 DIAGNOSIS — M25572 Pain in left ankle and joints of left foot: Secondary | ICD-10-CM | POA: Diagnosis not present

## 2021-07-20 DIAGNOSIS — M25672 Stiffness of left ankle, not elsewhere classified: Secondary | ICD-10-CM | POA: Diagnosis not present

## 2021-07-20 DIAGNOSIS — M25675 Stiffness of left foot, not elsewhere classified: Secondary | ICD-10-CM | POA: Diagnosis not present

## 2021-07-21 ENCOUNTER — Other Ambulatory Visit: Payer: Self-pay | Admitting: Internal Medicine

## 2021-07-21 DIAGNOSIS — J020 Streptococcal pharyngitis: Secondary | ICD-10-CM | POA: Insufficient documentation

## 2021-07-21 MED ORDER — CEFDINIR 300 MG PO CAPS: 300.0000 mg | ORAL_CAPSULE | Freq: Two times a day (BID) | ORAL | 0 refills | Status: DC

## 2021-07-21 NOTE — Assessment & Plan Note (Signed)
omnicef prescribed

## 2021-07-27 DIAGNOSIS — M25572 Pain in left ankle and joints of left foot: Secondary | ICD-10-CM | POA: Diagnosis not present

## 2021-07-27 DIAGNOSIS — M25675 Stiffness of left foot, not elsewhere classified: Secondary | ICD-10-CM | POA: Diagnosis not present

## 2021-07-27 DIAGNOSIS — M25672 Stiffness of left ankle, not elsewhere classified: Secondary | ICD-10-CM | POA: Diagnosis not present

## 2021-07-27 DIAGNOSIS — M6281 Muscle weakness (generalized): Secondary | ICD-10-CM | POA: Diagnosis not present

## 2021-08-03 ENCOUNTER — Other Ambulatory Visit: Payer: Self-pay | Admitting: Internal Medicine

## 2021-08-03 DIAGNOSIS — M25672 Stiffness of left ankle, not elsewhere classified: Secondary | ICD-10-CM | POA: Diagnosis not present

## 2021-08-03 DIAGNOSIS — M6281 Muscle weakness (generalized): Secondary | ICD-10-CM | POA: Diagnosis not present

## 2021-08-03 DIAGNOSIS — M25572 Pain in left ankle and joints of left foot: Secondary | ICD-10-CM | POA: Diagnosis not present

## 2021-08-03 DIAGNOSIS — M25675 Stiffness of left foot, not elsewhere classified: Secondary | ICD-10-CM | POA: Diagnosis not present

## 2021-08-05 ENCOUNTER — Ambulatory Visit
Admission: RE | Admit: 2021-08-05 | Discharge: 2021-08-05 | Disposition: A | Discharge status: Home / Self Care | Payer: 59 | Source: Ambulatory Visit | Attending: Internal Medicine | Admitting: Internal Medicine

## 2021-08-05 DIAGNOSIS — Z1231 Encounter for screening mammogram for malignant neoplasm of breast: Secondary | ICD-10-CM | POA: Diagnosis not present
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
## 2021-08-20 ENCOUNTER — Ambulatory Visit: Payer: Self-pay

## 2021-08-20 ENCOUNTER — Ambulatory Visit: Payer: 59 | Admitting: Family Medicine

## 2021-08-20 VITALS — BP 106/72 | HR 89 | Ht 69.0 in | Wt 208.0 lb

## 2021-08-20 DIAGNOSIS — M9908 Segmental and somatic dysfunction of rib cage: Secondary | ICD-10-CM

## 2021-08-20 DIAGNOSIS — M79672 Pain in left foot: Secondary | ICD-10-CM | POA: Diagnosis not present

## 2021-08-20 DIAGNOSIS — M9902 Segmental and somatic dysfunction of thoracic region: Secondary | ICD-10-CM | POA: Diagnosis not present

## 2021-08-20 DIAGNOSIS — M722 Plantar fascial fibromatosis: Secondary | ICD-10-CM | POA: Diagnosis not present

## 2021-08-20 DIAGNOSIS — M9903 Segmental and somatic dysfunction of lumbar region: Secondary | ICD-10-CM | POA: Diagnosis not present

## 2021-08-20 DIAGNOSIS — M9904 Segmental and somatic dysfunction of sacral region: Secondary | ICD-10-CM | POA: Diagnosis not present

## 2021-08-20 DIAGNOSIS — M9901 Segmental and somatic dysfunction of cervical region: Secondary | ICD-10-CM | POA: Diagnosis not present

## 2021-08-20 DIAGNOSIS — M5136 Other intervertebral disc degeneration, lumbar region: Secondary | ICD-10-CM | POA: Diagnosis not present

## 2021-08-20 NOTE — Patient Instructions (Signed)
If you change your mind on the foot, we can get an MRI  See me in 8 weeks

## 2021-08-21 NOTE — Assessment & Plan Note (Signed)
Chronic with exacerbation, discussed HEP  Discussed medication including celebrex.  Respond to OMT RTC in 6 weeks

## 2021-08-21 NOTE — Assessment & Plan Note (Signed)
Likely resolved but continue to have pain overall  Seems more medial  ?vascular vs nerve at this time.  Discussed MRI but pt decline,d will continue with current therapy at this time.

## 2021-08-31 DIAGNOSIS — B078 Other viral warts: Secondary | ICD-10-CM | POA: Diagnosis not present

## 2021-09-01 LAB — HM DIABETES EYE EXAM

## 2021-09-02 ENCOUNTER — Other Ambulatory Visit: Payer: Self-pay | Admitting: Internal Medicine

## 2021-09-29 ENCOUNTER — Telehealth: Payer: Self-pay

## 2021-09-29 NOTE — Patient Outreach (Signed)
  Care Coordination   09/29/2021 Name: Patricia [name] MRN: [id number] DOB: 05-21-1970   Care Coordination Outreach Attempts:  An unsuccessful telephone outreach was attempted today to offer the patient information about available care coordination services as a benefit of their health plan.   Follow Up Plan:  Additional outreach attempts will be made to offer the patient care coordination information and services.   Encounter Outcome:  No Answer  Care Coordination Interventions Activated:  No   Care Coordination Interventions:  No, not indicated    Noreene Larsson RN, MSN, [facility]  Mobile: [phone]

## 2021-10-13 ENCOUNTER — Ambulatory Visit: Payer: 59 | Admitting: Family Medicine

## 2021-10-13 ENCOUNTER — Telehealth: Payer: Self-pay

## 2021-10-13 NOTE — Telephone Encounter (Signed)
LMTCB to get patient schedule for follow-up she is overdue.

## 2021-11-02 ENCOUNTER — Ambulatory Visit: Payer: 59 | Admitting: Internal Medicine

## 2021-11-05 ENCOUNTER — Ambulatory Visit: Payer: 59 | Admitting: Internal Medicine

## 2021-11-05 ENCOUNTER — Encounter: Payer: Self-pay | Admitting: Internal Medicine

## 2021-11-05 VITALS — BP 122/78 | HR 85 | Temp 98.4°F | Ht 69.0 in | Wt 208.0 lb

## 2021-11-05 DIAGNOSIS — E1159 Type 2 diabetes mellitus with other circulatory complications: Secondary | ICD-10-CM

## 2021-11-05 DIAGNOSIS — E785 Hyperlipidemia, unspecified: Secondary | ICD-10-CM

## 2021-11-05 DIAGNOSIS — I10 Essential (primary) hypertension: Secondary | ICD-10-CM

## 2021-11-05 DIAGNOSIS — E669 Obesity, unspecified: Secondary | ICD-10-CM | POA: Diagnosis not present

## 2021-11-05 DIAGNOSIS — E1169 Type 2 diabetes mellitus with other specified complication: Secondary | ICD-10-CM | POA: Diagnosis not present

## 2021-11-05 DIAGNOSIS — I152 Hypertension secondary to endocrine disorders: Secondary | ICD-10-CM

## 2021-11-05 LAB — LIPID PANEL
Cholesterol: 257 mg/dL — ABNORMAL HIGH (ref 0–200)
HDL: 45.2 mg/dL (ref >39.00)
NonHDL: 211.81
Total CHOL/HDL Ratio: 6
Triglycerides: 222 mg/dL — ABNORMAL HIGH (ref 0.0–149.0)
VLDL: 44.4 mg/dL — ABNORMAL HIGH (ref 0.0–40.0)

## 2021-11-05 LAB — COMPREHENSIVE METABOLIC PANEL
ALT: 15 U/L (ref 0–35)
AST: 15 U/L (ref 0–37)
Albumin: 4.2 g/dL (ref 3.5–5.2)
Alkaline Phosphatase: 65 U/L (ref 39–117)
BUN: 11 mg/dL (ref 6–23)
CO2: 29 mEq/L (ref 19–32)
Calcium: 9.3 mg/dL (ref 8.4–10.5)
Chloride: 101 mEq/L (ref 96–112)
Creatinine, Ser: 0.61 mg/dL (ref 0.40–1.20)
GFR: 103.08 mL/min (ref >60.00)
Glucose, Bld: 109 mg/dL — ABNORMAL HIGH (ref 70–99)
Potassium: 3.8 mEq/L (ref 3.5–5.1)
Sodium: 137 mEq/L (ref 135–145)
Total Bilirubin: 0.6 mg/dL (ref 0.2–1.2)
Total Protein: 7.4 g/dL (ref 6.0–8.3)

## 2021-11-05 LAB — HEMOGLOBIN A1C: Hgb A1c MFr Bld: 6.8 % — ABNORMAL HIGH (ref 4.6–6.5)

## 2021-11-05 LAB — LDL CHOLESTEROL, DIRECT: Direct LDL: 181 mg/dL

## 2021-11-05 MED ORDER — AZELASTINE HCL 0.1 % NA SOLN
1.0000 | Freq: Two times a day (BID) | NASAL | 12 refills | Status: DC
Start: 2021-11-05 — End: 2021-11-26

## 2021-11-05 MED ORDER — LEVOCETIRIZINE DIHYDROCHLORIDE 5 MG PO TABS: 5.0000 mg | ORAL_TABLET | Freq: Every evening | ORAL | 1 refills | Status: DC

## 2021-11-05 MED ORDER — SEMAGLUTIDE(0.25 OR 0.5MG/DOS) 2 MG/3ML ~~LOC~~ SOPN: 0.2500 mg | PEN_INJECTOR | SUBCUTANEOUS | 2 refills | Status: DC

## 2021-11-05 NOTE — Patient Instructions (Addendum)
NO CHANGES TO BP MEDS TODAY  WE WILL ATTEMPT PA FOR OZEMPIC AGAIN AND IF IT FAILS,  DAILY VICTOZA...   You might want to try using Relaxium for insomnia  (as seen on TV commercials) . It contains:  Melatonin 5 mg  Chamomile 25 mg Passionflower extract 75 mg GABA 100 mg Ashwaganda extract 125 mg Magnesium citrate, glycinate, oxide (100 mg)  L tryptophan 500 mg Valerest (proprietary  ingredient ; probably valeria root extract)

## 2021-11-05 NOTE — Assessment & Plan Note (Addendum)
intolerant of metformin second ro persistent diarrhea.  Gaining weight after stopping Wegovy    Lost 35 lbs on WEgovy .  Wants to resume GLP 1 agonist.  Discussed trial of victoza

## 2021-11-05 NOTE — Progress Notes (Unsigned)
Subjective:  Patient ID: Patricia [name], female    DOB: 1970-05-10  Age: 51 y.o. MRN: [id number]  CC: The primary encounter diagnosis was Obesity, diabetes, and hypertension syndrome ([place]). Diagnoses of Hyperlipidemia associated with type 2 diabetes mellitus ([place]) and Essential hypertension were also pertinent to this visit.   HPI.    2)  Patricia [name] presents for  Chief Complaint  Patient presents with   Follow-up    Follow up on hypertension   1) HTN:  currently taking losartan hct only.  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    Outpatient Medications Prior to Visit  Medication Sig Dispense Refill   acetaminophen (TYLENOL) 500 MG tablet Take 500 mg by mouth every 6 (six) hours as needed. Taking 3,000 mg daily     aspirin EC 81 MG tablet Take 81 mg by mouth daily. Swallow whole.     Biotin 1000 MCG tablet Take 1,000 mcg by mouth daily.     butalbital-acetaminophen-caffeine (FIORICET) 50-325-40 MG tablet TAKE ONE TABLET BY MOUTH EVERY 6 HOURS AS NEEDED FOR PAIN 60 tablet 2   celecoxib (CELEBREX) 200 MG capsule TAKE 1 CAPSULE BY MOUTH 2 TIMES DAILY. 180 capsule 0   Cholecalciferol (VITAMIN D) 50 MCG (2000 UT) tablet Take 2,000 Units by mouth daily.     cyanocobalamin (,VITAMIN B-12,) 1000 MCG/ML injection Inject 1 mL (1,000 mcg total) into the muscle every 30 (thirty) days. 3 mL 3   cyclobenzaprine (FLEXERIL) 10 MG tablet Take 1 tablet (10 mg total) by mouth 3 (three) times daily as needed for muscle spasms. 90 tablet 2   gabapentin (NEURONTIN) 100 MG capsule Take 1 capsule (100 mg total) by mouth at bedtime. USING 300 MG AT BEDTIME WITH OTHER RX 90 capsule 3   losartan-hydrochlorothiazide (HYZAAR) 100-25 MG tablet Take 1 tablet by mouth daily. 90 tablet 1   metoprolol  tartrate (LOPRESSOR) 25 MG tablet TAKE 0.5 TABLET TO 1 TABLET BY MOUTH TWICE DAILY AS NEEDED FOR PALPITAIONS 60 tablet 2   montelukast (SINGULAIR) 10 MG tablet TAKE ONE TABLET AT BEDTIME 90 tablet 3   omeprazole (PRILOSEC) 40 MG capsule TAKE 1 CAPSULE BY MOUTH DAILY. 30 capsule 3   OVER THE COUNTER MEDICATION Take 1,500 mg by mouth daily. Calcium Pyurbate     progesterone (PROMETRIUM) 200 MG capsule Take 1 capsule (200 mg total) by mouth daily. 90 capsule 1   loratadine (CLARITIN) 10 MG tablet Take 1 tablet (10 mg total) by mouth daily. 90 tablet 2   cefdinir (OMNICEF) 300 MG capsule Take 1 capsule (300 mg total) by mouth 2 (two) times daily. (Patient not taking: Reported on 11/05/2021) 14 capsule 0   eszopiclone (LUNESTA) 2 MG TABS tablet Take 1 tablet (2 mg total) by mouth at bedtime as needed for sleep. Take immediately before bedtime (Patient not taking: Reported on 11/05/2021) 30 tablet 2   fluticasone (FLONASE) 50 MCG/ACT nasal spray SPRAY TWICE INTO EACH NOSTRIL EVERY DAY (Patient not taking: Reported on 11/05/2021) 16 g 6   levothyroxine (SYNTHROID) 50 MCG tablet Take 1 tablet (50 mcg total) by mouth daily. (Patient not taking: Reported on 11/05/2021) 90 tablet 3   lidocaine (LIDODERM) 5 % Place 1 patch onto the skin daily. Remove & Discard patch within  12 hours or as directed by MD (Patient not taking: Reported on 11/05/2021) 30 patch 0   ondansetron (ZOFRAN ODT) 4 MG disintegrating tablet Take 1 tablet (4 mg total) by mouth every 8 (eight) hours as needed for nausea or vomiting. (Patient not taking: Reported on 11/05/2021) 20 tablet 0   Semaglutide-Weight Management 2.4 MG/0.75ML SOAJ Inject 2.4 mg into the skin once a week. (Patient not taking: Reported on 11/05/2021) 3 mL 2   No facility-administered medications prior to visit.    Review of Systems;  Patient denies headache, fevers, malaise, unintentional weight loss, skin rash, eye pain, sinus congestion and sinus pain, sore throat,  dysphagia,  hemoptysis , cough, dyspnea, wheezing, chest pain, palpitations, orthopnea, edema, abdominal pain, nausea, melena, diarrhea, constipation, flank pain, dysuria, hematuria, urinary  Frequency, nocturia, numbness, tingling, seizures,  Focal weakness, Loss of consciousness,  Tremor, insomnia, depression, anxiety, and suicidal ideation.      Objective:  BP 122/78 (BP Location: Left Arm, Patient Position: Sitting, Cuff Size: Normal)   Pulse 85   Temp 98.4 F (36.9 C) (Oral)   Ht '5\' 9"'$  (1.753 m)   Wt 208 lb (94.3 kg)   SpO2 99%   BMI 30.72 kg/m   BP Readings from Last 3 Encounters:  11/05/21 122/78  08/20/21 106/72  07/08/21 114/76    Wt Readings from Last 3 Encounters:  11/05/21 208 lb (94.3 kg)  08/20/21 208 lb (94.3 kg)  07/08/21 212 lb (96.2 kg)    General appearance: alert, cooperative and appears stated age Ears: normal TM's and external ear canals both ears Throat: lips, mucosa, and tongue normal; teeth and gums normal Neck: no adenopathy, no carotid bruit, supple, symmetrical, trachea midline and thyroid not enlarged, symmetric, no tenderness/mass/nodules Back: symmetric, no curvature. ROM normal. No CVA tenderness. Lungs: clear to auscultation bilaterally Heart: regular rate and rhythm, S1, S2 normal, no murmur, click, rub or gallop Abdomen: soft, non-tender; bowel sounds normal; no masses,  no organomegaly Pulses: 2+ and symmetric Skin: Skin color, texture, turgor normal. No rashes or lesions Lymph nodes: Cervical, supraclavicular, and axillary nodes normal. Neuro:  awake and interactive with normal mood and affect. Higher cortical functions are normal. Speech is clear without word-finding difficulty or dysarthria. Extraocular movements are intact. Visual fields of both eyes are grossly intact. Sensation to light touch is grossly intact bilaterally of upper and lower extremities. Motor examination shows 4+/5 symmetric hand grip and upper extremity and 5/5 lower  extremity strength. There is no pronation or drift. Gait is non-ataxic   Lab Results  Component Value Date   HGBA1C 6.8 (H) 11/05/2021   HGBA1C 6.3 07/06/2021   HGBA1C 6.4 12/10/2020    Lab Results  Component Value Date   CREATININE 0.61 11/05/2021   CREATININE 0.65 07/06/2021   CREATININE 0.73 12/10/2020    Lab Results  Component Value Date   WBC 9.8 02/17/2020   HGB 12.3 02/17/2020   HCT 39.4 02/17/2020   PLT 270 02/17/2020   GLUCOSE 109 (H) 11/05/2021   CHOL 257 (H) 11/05/2021   TRIG 222.0 (H) 11/05/2021   HDL 45.20 11/05/2021   LDLDIRECT 181.0 11/05/2021   LDLCALC 115 (H) 07/06/2021   ALT 15 11/05/2021   AST 15 11/05/2021   NA 137 11/05/2021   K 3.8 11/05/2021   CL 101 11/05/2021   CREATININE 0.61 11/05/2021   BUN 11 11/05/2021   CO2 29 11/05/2021   TSH 0.82 12/10/2020   HGBA1C 6.8 (H) 11/05/2021   MICROALBUR <0.7  07/06/2021    MM 3D SCREEN BREAST BILATERAL  Result Date: 08/06/2021 CLINICAL DATA:  Screening. EXAM: DIGITAL SCREENING BILATERAL MAMMOGRAM WITH TOMOSYNTHESIS AND CAD TECHNIQUE: Bilateral screening digital craniocaudal and mediolateral oblique mammograms were obtained. Bilateral screening digital breast tomosynthesis was performed. The images were evaluated with computer-aided detection. COMPARISON:  Previous exam(s). ACR Breast Density Category c: The breast tissue is heterogeneously dense, which may obscure small masses. FINDINGS: There are no findings suspicious for malignancy. IMPRESSION: No mammographic evidence of malignancy. A result letter of this screening mammogram will be mailed directly to the patient. RECOMMENDATION: Screening mammogram in one year. (Code:SM-B-01Y) BI-RADS CATEGORY  1: Negative. Electronically Signed   By: Audie Pinto M.D.   On: 08/06/2021 12:47    Assessment & Plan:   Problem List Items Addressed This Visit     Essential hypertension    Diet controlled diabetes,   Continue ARB,  would like to resume GLP agonist  For  weight management.  Statin therapy recommended for LDL >150 Lab Results  Component Value Date   HGBA1C 6.8 (H) 11/05/2021   Lab Results  Component Value Date   LABMICR Comment 01/07/2016   MICROALBUR <0.7 07/06/2021   MICROALBUR 0.9 06/04/2020     Lab Results  Component Value Date   CHOL 257 (H) 11/05/2021   HDL 45.20 11/05/2021   LDLCALC 115 (H) 07/06/2021   LDLDIRECT 181.0 11/05/2021   TRIG 222.0 (H) 11/05/2021   CHOLHDL 6 11/05/2021         Obesity, diabetes, and hypertension syndrome (HCC) - Primary   Relevant Medications   Semaglutide,0.25 or 0.'5MG'$ /DOS, 2 MG/3ML SOPN   Other Relevant Orders   Hemoglobin A1c (Completed)   Comprehensive metabolic panel (Completed)   Hyperlipidemia associated with type 2 diabetes mellitus (HCC)    intolerant of metformin second ro persistent diarrhea.  Gaining weight after stopping Wegovy    Lost 35 lbs on WEgovy .  Wants to resume GLP 1 agonist.  Discussed trial of victoza       Relevant Medications   Semaglutide,0.25 or 0.'5MG'$ /DOS, 2 MG/3ML SOPN   Other Relevant Orders   Lipid panel (Completed)   LDL cholesterol, direct (Completed)    I spent a total of  29 minutes with this patient in a face to face visit on the date of this encounter reviewing the last office visit with me ,   patient's diet and exercise habits, home blood pressure /blood sugar readings, and post visit ordering of testing and therapeutics    Follow-up: Return in about 6 months (around 05/07/2022).   [name], MD

## 2021-11-07 NOTE — Assessment & Plan Note (Signed)
Diet controlled diabetes,   Continue ARB,  would like to resume GLP agonist  For weight management.  Statin therapy recommended for LDL >150 Lab Results  Component Value Date   HGBA1C 6.8 (H) 11/05/2021   Lab Results  Component Value Date   LABMICR Comment 01/07/2016   MICROALBUR <0.7 07/06/2021   MICROALBUR 0.9 06/04/2020     Lab Results  Component Value Date   CHOL 257 (H) 11/05/2021   HDL 45.20 11/05/2021   LDLCALC 115 (H) 07/06/2021   LDLDIRECT 181.0 11/05/2021   TRIG 222.0 (H) 11/05/2021   CHOLHDL 6 11/05/2021

## 2021-11-08 ENCOUNTER — Encounter: Payer: Self-pay | Admitting: Internal Medicine

## 2021-11-09 ENCOUNTER — Telehealth: Payer: Self-pay

## 2021-11-09 ENCOUNTER — Other Ambulatory Visit: Payer: Self-pay | Admitting: Internal Medicine

## 2021-11-09 DIAGNOSIS — E1159 Type 2 diabetes mellitus with other circulatory complications: Secondary | ICD-10-CM

## 2021-11-09 MED ORDER — ROSUVASTATIN CALCIUM 10 MG PO TABS: 10.0000 mg | ORAL_TABLET | Freq: Every day | ORAL | 3 refills | Status: DC

## 2021-11-09 NOTE — Chronic Care Management (AMB) (Signed)
   Care Guide Note  11/09/2021 Name: Lasonia Casino MRN: [id number] DOB: 1970/02/22  Referred by: [name], MD Reason for referral : Care Coordination (Outreach to schedule with Pharm D )   Uchenna Enda Santo is a 51 y.o. year old female who is a primary care patient of Derrel Nip, Aris Everts, MD. Genita Jyl Heinz was referred to the pharmacist for assistance related to DM.    Successful contact was made with the patient to discuss pharmacy services including being ready for the pharmacist to call at least 5 minutes before the scheduled appointment time, to have medication bottles and any blood sugar or blood pressure readings ready for review. The patient agreed to meet with the pharmacist via with the pharmacist via telephone visit on 11/26/2021.    Noreene Larsson, [place], [place] [address] Direct Dial: [phone] [email or web address]

## 2021-11-12 ENCOUNTER — Telehealth: Payer: Self-pay | Admitting: Internal Medicine

## 2021-11-12 ENCOUNTER — Encounter: Payer: Self-pay | Admitting: Nurse Practitioner

## 2021-11-12 ENCOUNTER — Other Ambulatory Visit: Payer: Self-pay | Admitting: Internal Medicine

## 2021-11-12 ENCOUNTER — Ambulatory Visit: Payer: 59 | Admitting: Nurse Practitioner

## 2021-11-12 VITALS — BP 136/72 | HR 86 | Temp 98.6°F | Resp 18 | Wt 207.1 lb

## 2021-11-12 DIAGNOSIS — H9203 Otalgia, bilateral: Secondary | ICD-10-CM | POA: Insufficient documentation

## 2021-11-12 DIAGNOSIS — H66002 Acute suppurative otitis media without spontaneous rupture of ear drum, left ear: Secondary | ICD-10-CM | POA: Diagnosis not present

## 2021-11-12 DIAGNOSIS — R051 Acute cough: Secondary | ICD-10-CM | POA: Insufficient documentation

## 2021-11-12 DIAGNOSIS — J3489 Other specified disorders of nose and nasal sinuses: Secondary | ICD-10-CM | POA: Insufficient documentation

## 2021-11-12 DIAGNOSIS — T3695XA Adverse effect of unspecified systemic antibiotic, initial encounter: Secondary | ICD-10-CM | POA: Diagnosis not present

## 2021-11-12 DIAGNOSIS — B379 Candidiasis, unspecified: Secondary | ICD-10-CM | POA: Insufficient documentation

## 2021-11-12 LAB — POC COVID19 BINAXNOW: SARS Coronavirus 2 Ag: NEGATIVE

## 2021-11-12 MED ORDER — FLUCONAZOLE 150 MG PO TABS: 150.0000 mg | ORAL_TABLET | Freq: Once | ORAL | 0 refills | Status: AC

## 2021-11-12 MED ORDER — HYDROCODONE BIT-HOMATROP MBR 5-1.5 MG/5ML PO SOLN: 5.0000 mL | Freq: Every evening | ORAL | 0 refills | Status: DC | PRN

## 2021-11-12 MED ORDER — AMOXICILLIN-POT CLAVULANATE 875-125 MG PO TABS: 1.0000 | ORAL_TABLET | Freq: Two times a day (BID) | ORAL | 0 refills | Status: AC

## 2021-11-12 NOTE — Assessment & Plan Note (Addendum)
Patient states Patricia Ridgel do not work for her generally.  Did review over-the-counter cough medications and recommended patient use something like Delsym  COVID test negative in office.

## 2021-11-12 NOTE — Assessment & Plan Note (Signed)
States Augmentin generally results in a yeast infection.  Diflucan 150 mg 1 tab 1 time sent to pharmacy

## 2021-11-12 NOTE — Telephone Encounter (Signed)
Call from [place] She is now requesting cough medication to help her for bedtime Rx for hycodan sent as requested

## 2021-11-12 NOTE — Progress Notes (Signed)
Acute Office Visit  Subjective:     Patient ID: Patricia [name], female    DOB: 1970-02-20, 51 y.o.   MRN: [id number]  Chief Complaint  Patient presents with   Cough    Runny nose, tickle in the throat, post nasal drip x 3 days, last night started feeling more stuffy, nasal congestion, chest congestion, some ear pain, sinus pressure.      Patient is in today for sick symptoms  States that this has been going on for approx 2 months. She was recently seen by PCP and had her nosepray changed and added xyzal Symptoms started approx 3 days ago. On Tuesday evening Covid test in office Moderna vaccines x2 Flu vaccine is not up to date  States that she has grand kids. States that 3 of the 4 are sick with sinus stuff.   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  Has been taking dayquill and nyquill. No relief    Review of Systems  Constitutional:  Positive for malaise/fatigue. Negative for chills and fever.       Appetite decreased some  Fluid intake normal  HENT:  Positive for congestion, ear pain (R>L), sinus pain and sore throat (scrtachy). Negative for ear discharge.   Respiratory:  Positive for cough, sputum production and shortness of breath.   Gastrointestinal:  Negative for abdominal pain, nausea and vomiting.  Musculoskeletal:  Negative for joint pain and myalgias.  Neurological:  Negative for headaches.        Objective:    BP 136/72   Pulse 86   Temp 98.6 F (37 C) (Temporal)   Resp 18   Wt 207 lb 2 oz (94 kg)   SpO2 99%   BMI 30.59 kg/m    Physical Exam Vitals and nursing note reviewed.  Constitutional:      Appearance: Normal appearance.  HENT:     Right Ear: Ear canal and external ear normal.     Left Ear: Ear canal and external ear normal. Tympanic membrane is erythematous and bulging.     Nose:     Right Sinus: Maxillary sinus tenderness present.     Left Sinus:  Maxillary sinus tenderness present.     Comments: L>R    Mouth/Throat:     Mouth: Mucous membranes are moist.     Pharynx: Oropharynx is clear.  Cardiovascular:     Rate and Rhythm: Normal rate and regular rhythm.     Heart sounds: Normal heart sounds.  Pulmonary:     Effort: Pulmonary effort is normal.     Breath sounds: Normal breath sounds.  Lymphadenopathy:     Cervical: No cervical adenopathy.  Neurological:     Mental Status: She is alert.     Results for orders placed or performed in visit on 11/12/21  POC COVID-19  Result Value Ref Range   SARS Coronavirus 2 Ag Negative Negative        Assessment & Plan:   Problem List Items Addressed This Visit       Nervous and Auditory   Non-recurrent acute suppurative otitis media of left ear without spontaneous rupture of tympanic membrane - Primary    We will place patient on Augmentin 875-125 mg 1 tab twice daily for 7 days.  Follow-up if no improvement      Relevant Medications   amoxicillin-clavulanate (AUGMENTIN) 875-125 MG tablet   fluconazole (DIFLUCAN) 150 MG tablet  Other   Acute cough    Patient states Ladona Ridgel do not work for her generally.  Did review over-the-counter cough medications and recommended patient use something like Delsym      Relevant Orders   POC COVID-19 (Completed)   Sinus pressure    Continue using over-the-counter prescribed treatment such as Xyzal and Astelin nasal spray.      Otalgia of both ears    Can use over-the-counter analgesics as needed      Antibiotic-induced yeast infection    States Augmentin generally results in a yeast infection.  Diflucan 150 mg 1 tab 1 time sent to pharmacy      Relevant Medications   fluconazole (DIFLUCAN) 150 MG tablet    Meds ordered this encounter  Medications   amoxicillin-clavulanate (AUGMENTIN) 875-125 MG tablet    Sig: Take 1 tablet by mouth 2 (two) times daily for 7 days.    Dispense:  14 tablet    Refill:  0    Order  Specific Question:   Supervising Provider    Answer:   Loura Pardon A [[id number]]   fluconazole (DIFLUCAN) 150 MG tablet    Sig: Take 1 tablet (150 mg total) by mouth once for 1 dose.    Dispense:  1 tablet    Refill:  0    Order Specific Question:   Supervising Provider    Answer:   TOWER, MARNE A [[id number]]    Return if symptoms worsen or fail to improve.  Patricia Garret, NP

## 2021-11-12 NOTE — Assessment & Plan Note (Signed)
Can use over-the-counter analgesics as needed

## 2021-11-12 NOTE — Assessment & Plan Note (Signed)
We will place patient on Augmentin 875-125 mg 1 tab twice daily for 7 days.  Follow-up if no improvement

## 2021-11-12 NOTE — Assessment & Plan Note (Signed)
Continue using over-the-counter prescribed treatment such as Xyzal and Astelin nasal spray.

## 2021-11-12 NOTE — Patient Instructions (Signed)
Nice to see you today I have sent in the antibiotics and the yeast pill if you need it Follow up if you do not start improving  Covid test was negative in office

## 2021-11-15 ENCOUNTER — Other Ambulatory Visit: Payer: Self-pay | Admitting: Nurse Practitioner

## 2021-11-15 MED ORDER — ALBUTEROL SULFATE HFA 108 (90 BASE) MCG/ACT IN AERS: 2.0000 | INHALATION_SPRAY | Freq: Four times a day (QID) | RESPIRATORY_TRACT | 0 refills | Status: DC | PRN

## 2021-11-15 NOTE — Telephone Encounter (Signed)
Hycodan cough syrup was sent in by Dr Silvio Pate on 11/12/21. Patient asking for Albuterol inhaler to be refilled also, this was in the past medication list for the patient albuterol (PROVENTIL HFA;VENTOLIN HFA) 108 (90 Base) MCG/ACT inhaler

## 2021-11-15 NOTE — Telephone Encounter (Addendum)
Please see Dr Silvio Pate note below access nurse note; sending to Romilda Garret NP and Anastasiya CMA.   [place] Night - Client TELEPHONE ADVICE RECORD AccessNurse Patient Name: Patricia [name] [facility] Gender: Female DOB: 09-14-1970 Age: 51 Y 14 D Return Phone Number: [phone] (Primary) Address: City/ State/ Zip: [place]  [address] Client [place] Night - Client Client Site [place] Provider Romilda Garret- NP Contact Type Call Who Is Calling Patient / Member / Family / Caregiver Call Type Triage / Clinical Relationship To Patient Self Return Phone Number [phone] (Primary) Chief Complaint Cough Reason for Call Symptomatic / Request for [place] states she was diagnosed with a sinus infection and bronchitis. She also states she cannot stop coughing and needs a RX prescribed. She also needs her inhaler refilled. Translation No Nurse Assessment Nurse: Eugenio Hoes, RN, [place] Date/Time Eilene Ghazi Time): 11/12/2021 7:49:39 PM Confirm and document reason for call. If symptomatic, describe symptoms. ---Caller states she was diagnosed with a sinus infection and bronchitis. She also states she cannot stop coughing. Does the patient have any new or worsening symptoms? ---Yes Will a triage be completed? ---Yes Related visit to physician within the last 2 weeks? ---Yes Does the PT have any chronic conditions? (i.e. diabetes, asthma, this includes High risk factors for pregnancy, etc.) ---Yes List chronic conditions. ---hypertension, pre diabetic Is the patient pregnant or possibly pregnant? (Ask all females between the ages of 29-55) ---No Is this a behavioral health or substance abuse call? ---No Guidelines Guideline Title Affirmed Question Affirmed Notes Nurse Date/Time Eilene Ghazi Time) Recent Medical Visit for Illness Follow-up Call [1] Recent medical visit  within 24 hours AND [2] condition / symptoms Beola Cord, RN, [facility] 11/12/2021 7:50:26 PM PLEASE NOTE: All timestamps contained within this report are represented as [place] Standard Time. CONFIDENTIALTY NOTICE: This fax transmission is intended only for the addressee. It contains information that is legally privileged, confidential or otherwise protected from use or disclosure. If you are not the intended recipient, you are strictly prohibited from reviewing, disclosing, copying using or disseminating any of this information or taking any action in reliance on or regarding this information. If you have received this fax in error, please notify us immediately by telephone so that we can arrange for its return to [place]. Phone: [phone], Toll-Free: [phone], Fax: [phone] Page: 2 of 2 Call Id: [id number] [place]. Time Eilene Ghazi Time) Disposition Final User 11/12/2021 7:25:00 PM Attempt made - message left Debbe [place] 11/12/2021 7:58:07 PM Call PCP Now Yes Eugenio Hoes, RN, [facility] 11/12/2021 8:01:51 PM Paged On Call back to Call [place], [place], [place] Final Disposition 11/12/2021 7:58:07 PM Call PCP Now Yes Eugenio Hoes, RN, [facility] Disagree/Comply Comply Caller Understands Yes PreDisposition Call Doctor Care Advice Given Per Guideline CALL PCP NOW: * You need to discuss this with your doctor (or NP/PA). * I'll page the on-call provider now. If you haven't heard from the provider (or me) within 30 minutes, call again. CALL BACK IF: * You become worse CARE ADVICE given per Recent Medical Visit for Illness: Follow-Up Call (Adult) guideline. Paging DoctorName Phone DateTime Result/ Outcome Message Type Notes Viviana Simpler- MD [phone] 11/12/2021 8:01:51 PM Paged On Call Back to Call Center Doctor Paged Caller states she was see earlier today and was told she has a sinus infection and bronchitis. States her cough is much worse since being seen and has some SOB with  exertion/coughing. States provider  was going to send cough suppressant to pharmacy but she refused.Requesting cough medication now. Pharmacy info [facility] [phone]. Disposition was Call PCP now. Please advise thanks. Viviana Simpler- MD 11/12/2021 8:23:10 PM Spoke with On Call - General Message Result Provider states he is going to send cough syrup to [facility] pharmacy [phone]. Caller informed voiced understandin

## 2021-11-15 NOTE — Telephone Encounter (Signed)
Albuterol inhaler sent in.

## 2021-11-24 ENCOUNTER — Encounter: Payer: Self-pay | Admitting: Internal Medicine

## 2021-11-26 ENCOUNTER — Other Ambulatory Visit: Payer: 59 | Admitting: Pharmacist

## 2021-11-26 DIAGNOSIS — E785 Hyperlipidemia, unspecified: Secondary | ICD-10-CM

## 2021-11-26 MED ORDER — DOXYCYCLINE HYCLATE 100 MG PO TABS: 100.0000 mg | ORAL_TABLET | Freq: Two times a day (BID) | ORAL | 0 refills | Status: DC

## 2021-11-26 MED ORDER — TIRZEPATIDE 2.5 MG/0.5ML ~~LOC~~ SOAJ: 2.5000 mg | SUBCUTANEOUS | 1 refills | Status: DC

## 2021-11-26 MED ORDER — ALBUTEROL SULFATE HFA 108 (90 BASE) MCG/ACT IN AERS: 2.0000 | INHALATION_SPRAY | Freq: Four times a day (QID) | RESPIRATORY_TRACT | 0 refills | Status: DC | PRN

## 2021-11-26 MED ORDER — AMOXICILLIN-POT CLAVULANATE 875-125 MG PO TABS: 1.0000 | ORAL_TABLET | Freq: Two times a day (BID) | ORAL | 0 refills | Status: DC

## 2021-11-26 NOTE — Progress Notes (Signed)
11/26/2021 Name: Patricia [name] MRN: [id number] DOB: 06-14-1970  Chief Complaint  Patient presents with   Medication Management   Diabetes    Patricia [name] is a 51 y.o. year old female who presented for a telephone visit.   They were referred to the pharmacist by their PCP for assistance in managing diabetes and medication access  Subjective:  Care Team: Primary Care Provider: [name], MD ; Next Scheduled Visit: 05/10/22  Medication Access/Adherence  Current Pharmacy:  [place], [place] - [address] [place] [address] Phone: [phone] Fax: [place], [place] ( [place] [address] Phone: [phone] Fax: [place] [address]00712 [place], [place] - [place] AT [address] [place] [address] Phone: [phone] Fax: [phone]   Patient reports affordability concerns with their medications: Yes  Patient reports access/transportation concerns to their pharmacy: No  Patient reports adherence concerns with their medications:  No    Reports that she was on [facility] when previously on [facility], but has had to transition to an [place] that did not provide [facility] coverage. Notes that PA for Ozempic was also denied.    Diabetes:  Current medications: not on therapy; prescribed Ozempic; previously reports greater impact on Wegovy than on Ozempic Medications tried in the past: metformin XR - significant diarrhea  Current meal patterns: attempts low carbohydrate;   Current physical activity: limited by chronic pain  Hypertension:  Current medications: losartan/HCTZ 100/25 mg daily, metoprolol tartrate 25 mg twice daily   Hyperlipidemia/ASCVD Risk Reduction  Current lipid lowering medications:  rosuvastatin 10 mg daily  Antiplatelet regimen: aspirin 81 mg daily  Health Maintenance  Health Maintenance Due  Topic Date Due   Zoster Vaccines- Shingrix (1 of 2) Never done   COVID-19 Vaccine (3 - Moderna risk series) 11/01/2019   FOOT EXAM  06/04/2021     Objective: Lab Results  Component Value Date   HGBA1C 6.8 (H) 11/05/2021    Lab Results  Component Value Date   CREATININE 0.61 11/05/2021   BUN 11 11/05/2021   NA 137 11/05/2021   K 3.8 11/05/2021   CL 101 11/05/2021   CO2 29 11/05/2021    Lab Results  Component Value Date   CHOL 257 (H) 11/05/2021   HDL 45.20 11/05/2021   LDLCALC 115 (H) 07/06/2021   LDLDIRECT 181.0 11/05/2021   TRIG 222.0 (H) 11/05/2021   CHOLHDL 6 11/05/2021    Medications Reviewed Today     Reviewed by Osker Mason, RPH-CPP (Pharmacist) on 11/26/21 at 1001  Med List Status: <None>   Medication Order Taking? Sig Documenting Provider Last Dose Status Informant  acetaminophen (TYLENOL) 500 MG tablet [id number] Yes Take 500 mg by mouth every 6 (six) hours as needed. Taking 3,000 mg daily [provider] Taking Active   albuterol (VENTOLIN HFA) 108 (90 Base) MCG/ACT inhaler [id number] Yes Inhale 2 puffs into the lungs every 6 (six) hours as needed for wheezing or shortness of breath. Michela Pitcher, NP Taking Active   aspirin EC 81 MG tablet [id number] Yes Take 81 mg by mouth daily. Swallow whole. [provider] Taking Active   Biotin 1000 MCG tablet [id number] Yes Take 1,000 mcg by mouth daily. [provider] Taking Active Self  butalbital-acetaminophen-caffeine (FIORICET) 50-325-40 MG tablet [id number] Yes TAKE ONE TABLET BY MOUTH EVERY 6 HOURS AS NEEDED FOR PAIN [name], MD Taking Active   celecoxib (CELEBREX) 200 MG capsule [id number] Yes TAKE 1 CAPSULE BY MOUTH 2 TIMES DAILY. [name], MD Taking Active            Med Note Jodi Mourning, Grace Bushy   Fri Nov 26, 2021  9:56 AM) Taking QPM   Cholecalciferol (VITAMIN D) 50 MCG (2000 UT) tablet [id number] Yes Take 2,000 Units by mouth daily. [provider] Taking Active Self  cyanocobalamin (,VITAMIN B-12,) 1000 MCG/ML injection [id number] Yes Inject 1 mL (1,000 mcg total) into the muscle every 30 (thirty) days. [name], MD Taking Active   cyclobenzaprine (FLEXERIL) 10 MG tablet [id number] Yes Take 1 tablet (10 mg total) by mouth 3 (three) times daily as needed for muscle spasms. [name], DO Taking Active Self  gabapentin (NEURONTIN) 100 MG capsule [id number] Yes Take 1 capsule (100 mg total) by mouth at bedtime. USING 300 MG AT BEDTIME WITH OTHER RX  Patient taking differently: Take 100 mg by mouth at bedtime.   [name], MD Taking Active            Med Note Jodi Mourning, Grace Bushy   Fri Nov 26, 2021  9:57 AM) Taking 100 mg QPM  levocetirizine (XYZAL) 5 MG tablet [id number] Yes Take 1 tablet (5 mg total) by mouth every evening. [name], MD Taking Active   losartan-hydrochlorothiazide [facility]) 100-25 MG tablet [id number] Yes Take 1 tablet by mouth daily. [name], MD Taking Active   metoprolol tartrate (LOPRESSOR) 25 MG tablet [id number] No TAKE 0.5 TABLET TO 1 TABLET BY MOUTH TWICE DAILY AS NEEDED FOR PALPITAIONS  Patient not taking: Reported on 11/26/2021   [name], MD Not Taking Active   montelukast (SINGULAIR) 10 MG tablet [id number] Yes TAKE ONE TABLET AT BEDTIME [name], MD Taking Active   omeprazole (PRILOSEC) 40 MG capsule [id number] Yes TAKE 1 CAPSULE BY MOUTH DAILY. [name], MD Taking Active            Med Note Jodi Mourning, Grace Bushy   Fri Nov 26, 2021 10:00 AM) PRN  OVER THE COUNTER MEDICATION [id number] Yes Take 1,500 mg by mouth daily. Calcium Pyurbate [provider] Taking Active Self  progesterone (PROMETRIUM) 200 MG capsule [id number] Yes Take 1 capsule (200 mg total) by mouth daily. [name], MD Taking Active   rosuvastatin (CRESTOR) 10 MG  tablet [id number] Yes Take 1 tablet (10 mg total) by mouth daily. [name], MD Taking Active   Semaglutide,0.25 or 0.'5MG'$ /DOS, 2 MG/3ML Bonney Aid [id number] No Inject 0.25 mg into the skin once a week.  Patient not taking: Reported on 11/26/2021   [name], MD Not Taking Active   Med List Note Dewayne Shorter, RN 03/11/20 1422): 03-10-2020  Message sent to Lars Mage to get approval to stop Xarelto for 3 days for a procedure 03-11-2020 message received that patient could not stop her Xarelto until after 05-16-2020.  See note in telephone calls.  kt               Assessment/Plan:   Diabetes,: - Currently uncontrolled at more stringent goal of <6.5% - Discussed tirzepatide vs semaglutide. Unclear why PA for Ozempic was denied. Will attempt PA for [facility] as patient as tried and failed metformin therapy. Will follow  for outcome - Patient will continue to discuss with insurance agent to pursue plan options that cover GLP1 for either obesity/overweight or DM.   Hypertension: - Currently controlled - Recommend to continue current regimen at this time  Hyperlipidemia/ASCVD Risk Reduction: - Currently uncontrolled.  - Reviewed indication for statin therapy. Per PCP lab note, ordered LFTs and assisted patient in scheduling.  - Recommend to continue current regimen   Follow Up Plan: follow up with insurance next week  Catie Hedwig Morton, PharmD, [place] [phone]

## 2021-11-26 NOTE — Patient Instructions (Addendum)
Zaela,   I will let you know when I hear back from your insurance regarding the [facility]. Talk to your insurance agent about plans that cover Wegovy or Zepbound (indicated for obesity/overweight) or Ozempic or Mounjaro (diabetes).   Take care,   Catie Hedwig Morton, PharmD, [facility] [phone]

## 2021-11-29 ENCOUNTER — Encounter: Payer: Self-pay | Admitting: Pharmacist

## 2021-11-29 ENCOUNTER — Other Ambulatory Visit: Payer: Self-pay | Admitting: Internal Medicine

## 2021-11-30 ENCOUNTER — Other Ambulatory Visit: Payer: Self-pay | Admitting: Pharmacist

## 2021-11-30 DIAGNOSIS — E1169 Type 2 diabetes mellitus with other specified complication: Secondary | ICD-10-CM

## 2021-11-30 MED ORDER — TRULICITY 0.75 MG/0.5ML ~~LOC~~ SOAJ: 0.7500 mg | SUBCUTANEOUS | 2 refills | Status: DC

## 2021-11-30 NOTE — Progress Notes (Signed)
Care Coordination Call  Received PA denial for [facility]. Per PA denial, patient has to try and fail all formulary alternatives, and both Victoza and Trulicity are on formulary. Patient has tried Victoza before and could not tolerate due to GI upset, vomiting. Discussed with patient. Will send for Trulicity 2.89 mg weekly, with plan to titrate monthly as tolerated.   Catie Hedwig Morton, PharmD, [facility] [phone]

## 2021-12-08 ENCOUNTER — Ambulatory Visit: Payer: 59 | Admitting: Family Medicine

## 2021-12-08 ENCOUNTER — Ambulatory Visit (INDEPENDENT_AMBULATORY_CARE_PROVIDER_SITE_OTHER): Payer: 59

## 2021-12-08 VITALS — BP 108/82 | HR 82 | Ht 69.0 in | Wt 204.0 lb

## 2021-12-08 DIAGNOSIS — M9901 Segmental and somatic dysfunction of cervical region: Secondary | ICD-10-CM

## 2021-12-08 DIAGNOSIS — M9908 Segmental and somatic dysfunction of rib cage: Secondary | ICD-10-CM

## 2021-12-08 DIAGNOSIS — M542 Cervicalgia: Secondary | ICD-10-CM

## 2021-12-08 DIAGNOSIS — M5412 Radiculopathy, cervical region: Secondary | ICD-10-CM

## 2021-12-08 DIAGNOSIS — M9902 Segmental and somatic dysfunction of thoracic region: Secondary | ICD-10-CM

## 2021-12-08 DIAGNOSIS — M9904 Segmental and somatic dysfunction of sacral region: Secondary | ICD-10-CM | POA: Diagnosis not present

## 2021-12-08 DIAGNOSIS — M9903 Segmental and somatic dysfunction of lumbar region: Secondary | ICD-10-CM | POA: Diagnosis not present

## 2021-12-08 MED ORDER — METHYLPREDNISOLONE ACETATE 80 MG/ML IJ SUSP
80.0000 mg | Freq: Once | INTRAMUSCULAR | Status: AC
  Administered 2021-12-08: 80 mg via INTRAMUSCULAR

## 2021-12-08 MED ORDER — PREDNISONE 20 MG PO TABS: 40.0000 mg | ORAL_TABLET | Freq: Every day | ORAL | 0 refills | Status: DC

## 2021-12-08 MED ORDER — GABAPENTIN 100 MG PO CAPS: 200.0000 mg | ORAL_CAPSULE | Freq: Every day | ORAL | 0 refills | Status: DC

## 2021-12-08 MED ORDER — KETOROLAC TROMETHAMINE 60 MG/2ML IM SOLN
60.0000 mg | Freq: Once | INTRAMUSCULAR | Status: AC
  Administered 2021-12-08: 60 mg via INTRAMUSCULAR

## 2021-12-08 NOTE — Patient Instructions (Signed)
Good to see you Pred '40mg'$  for 5 days start tmrw  Gabapentin '200mg'$  at night See me in 4-6 weeks

## 2021-12-08 NOTE — Assessment & Plan Note (Signed)
Chronic problem with worsening symptoms.  Discussed with patient about icing regimen and home exercises.  Discussed which activities to do and which ones to avoid.  Increase activity slowly.  Prednisone given.  Toradol and Depo-Medrol given as well secondary to the severity of this.  Follow-up with me again in 5-6weeks otherwise.  Patient will come back sooner if necessary or seek other medical attention.
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
## 2021-12-09 ENCOUNTER — Other Ambulatory Visit: Payer: Self-pay | Admitting: Pharmacist

## 2021-12-09 NOTE — Progress Notes (Signed)
Care Coordination   Patient notified me via MyChart that PA is required for Trulicity. Completed via Cover My Meds   Will follow up.   Catie Hedwig Morton, PharmD, [facility] [phone]

## 2021-12-14 ENCOUNTER — Other Ambulatory Visit: Payer: 59

## 2021-12-14 DIAGNOSIS — J329 Chronic sinusitis, unspecified: Secondary | ICD-10-CM | POA: Diagnosis not present

## 2021-12-14 DIAGNOSIS — J309 Allergic rhinitis, unspecified: Secondary | ICD-10-CM | POA: Diagnosis not present

## 2021-12-14 DIAGNOSIS — R0981 Nasal congestion: Secondary | ICD-10-CM | POA: Diagnosis not present

## 2021-12-21 NOTE — Telephone Encounter (Signed)
Error

## 2021-12-22 NOTE — Telephone Encounter (Signed)
MyChart messgae sent to patient. 

## 2021-12-27 ENCOUNTER — Other Ambulatory Visit: Payer: 59 | Admitting: Pharmacist

## 2021-12-29 ENCOUNTER — Other Ambulatory Visit: Payer: Self-pay | Admitting: Internal Medicine

## 2021-12-29 DIAGNOSIS — Z23 Encounter for immunization: Secondary | ICD-10-CM | POA: Insufficient documentation

## 2021-12-29 MED ORDER — OSELTAMIVIR PHOSPHATE 75 MG PO CAPS: 75.0000 mg | ORAL_CAPSULE | Freq: Every day | ORAL | 0 refills | Status: AC
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
## 2021-12-30 ENCOUNTER — Other Ambulatory Visit: Payer: 59 | Admitting: Pharmacist

## 2021-12-30 VITALS — Wt 212.0 lb

## 2021-12-30 DIAGNOSIS — I152 Hypertension secondary to endocrine disorders: Secondary | ICD-10-CM

## 2021-12-30 MED ORDER — TRULICITY 1.5 MG/0.5ML ~~LOC~~ SOAJ: 1.5000 mg | SUBCUTANEOUS | 2 refills | Status: DC

## 2021-12-30 NOTE — Progress Notes (Signed)
12/30/2021 Name: Patricia [name] MRN: [id number] DOB: [birth year]  Chief Complaint  Patient presents with   Diabetes   Medication Management    Adjoa Ghina Bittinger is a 51 y.o. year old female who presented for a telephone visit.   They were referred to the pharmacist by their PCP for assistance in managing diabetes.   Subjective:  Care Team: Primary Care Provider: [name], MD ; Next Scheduled Visit: 05/09/21  Medication Access/Adherence  Current Pharmacy:  [place], [place] - [address] [place] [address] Phone: [phone] Fax: [phone]  [facility] [place], [place] AT [place] [address] ( [place] [address] Phone: [phone] Fax: [facility] #[address] [place], [place] - [place] [address] [place] [address] Phone: [phone] Fax: [phone]   Patient reports affordability concerns with their medications: No  Patient reports access/transportation concerns to their pharmacy: No  Patient reports adherence concerns with their medications:  No     Diabetes and Obesity   Current medications: Trulicity 0.17 mg weekly Medications tried in the past: Victoza   Reports she is tolerating this medication well. Denies GI upset. However, denies any impact on appetite. Reports she is interested in increasing the dose.     Objective: Lab Results  Component Value Date   HGBA1C 6.8 (H) 11/05/2021    Lab Results  Component Value Date   CREATININE 0.61 11/05/2021   BUN 11 11/05/2021   NA 137 11/05/2021   K 3.8 11/05/2021   CL 101 11/05/2021   CO2 29 11/05/2021    Lab Results  Component Value Date   CHOL 257 (H) 11/05/2021   HDL 45.20 11/05/2021   LDLCALC 115 (H) 07/06/2021   LDLDIRECT 181.0 11/05/2021   TRIG 222.0 (H) 11/05/2021    CHOLHDL 6 11/05/2021     Medications Reviewed Today     Reviewed by Osker Mason, RPH-CPP (Pharmacist) on 11/26/21 at 1001  Med List Status: <None>   Medication Order Taking? Sig Documenting Provider Last Dose Status Informant  acetaminophen (TYLENOL) 500 MG tablet [id number] Yes Take 500 mg by mouth every 6 (six) hours as needed. Taking 3,000 mg daily [provider] Taking Active   albuterol (VENTOLIN HFA) 108 (90 Base) MCG/ACT inhaler [id number] Yes Inhale 2 puffs into the lungs every 6 (six) hours as needed for wheezing or shortness of breath. Michela Pitcher, NP Taking Active   aspirin EC 81 MG tablet [id number] Yes Take 81 mg by mouth daily. Swallow whole. [provider] Taking Active   Biotin 1000 MCG tablet [id number] Yes Take 1,000 mcg by mouth daily. [provider] Taking Active Self  butalbital-acetaminophen-caffeine (FIORICET) 50-325-40 MG tablet [id number] Yes TAKE ONE TABLET BY MOUTH EVERY 6 HOURS AS NEEDED FOR PAIN [name], MD Taking Active   celecoxib (CELEBREX) 200 MG capsule [id number] Yes TAKE 1 CAPSULE BY MOUTH 2 TIMES DAILY. [name], MD Taking Active            Med Note Jodi Mourning, Grace Bushy   Fri Nov 26, 2021  9:56 AM) Taking QPM  Cholecalciferol (VITAMIN D) 50 MCG (2000 UT) tablet [id number] Yes Take 2,000 Units by mouth daily. [provider] Taking Active Self  cyanocobalamin (,VITAMIN B-12,) 1000 MCG/ML injection  [id number] Yes Inject 1 mL (1,000 mcg total) into the muscle every 30 (thirty) days. [name], MD Taking Active   cyclobenzaprine (FLEXERIL) 10 MG tablet [id number] Yes Take 1 tablet (10 mg total) by mouth 3 (three) times daily as needed for muscle spasms. [name], DO Taking Active Self  gabapentin (NEURONTIN) 100 MG capsule [id number] Yes Take 1 capsule (100 mg total) by mouth at bedtime. USING 300 MG AT BEDTIME WITH OTHER RX  Patient taking differently: Take 100 mg by mouth at bedtime.   [name], MD Taking Active            Med Note Jodi Mourning, Grace Bushy   Fri Nov 26, 2021  9:57 AM) Taking 100 mg QPM  levocetirizine (XYZAL) 5 MG tablet [id number] Yes Take 1 tablet (5 mg total) by mouth every evening. [name], MD Taking Active   losartan-hydrochlorothiazide [facility]) 100-25 MG tablet [id number] Yes Take 1 tablet by mouth daily. [name], MD Taking Active   metoprolol tartrate (LOPRESSOR) 25 MG tablet [id number] No TAKE 0.5 TABLET TO 1 TABLET BY MOUTH TWICE DAILY AS NEEDED FOR PALPITAIONS  Patient not taking: Reported on 11/26/2021   [name], MD Not Taking Active   montelukast (SINGULAIR) 10 MG tablet [id number] Yes TAKE ONE TABLET AT BEDTIME [name], MD Taking Active   omeprazole (PRILOSEC) 40 MG capsule [id number] Yes TAKE 1 CAPSULE BY MOUTH DAILY. [name], MD Taking Active            Med Note Jodi Mourning, Grace Bushy   Fri Nov 26, 2021 10:00 AM) PRN  OVER THE COUNTER MEDICATION [id number] Yes Take 1,500 mg by mouth daily. Calcium Pyurbate [provider] Taking Active Self  progesterone (PROMETRIUM) 200 MG capsule [id number] Yes Take 1 capsule (200 mg total) by mouth daily. [name], MD Taking Active   rosuvastatin (CRESTOR) 10 MG tablet [id number] Yes Take 1 tablet (10 mg total) by mouth daily. [name], MD Taking Active   Semaglutide,0.25 or 0.'5MG'$ /DOS, 2 MG/3ML Bonney Aid [id number] No Inject 0.25 mg into the skin once a week.  Patient not taking: Reported on 11/26/2021   [name], MD Not Taking Active   Med List Note Dewayne Shorter, RN 03/11/20 1422): 03-10-2020  Message sent to Lars Mage to get approval to stop Xarelto for 3 days for a procedure 03-11-2020 message received that patient could not stop her Xarelto until after 05-16-2020.  See note in telephone calls.  kt               Assessment/Plan:   Diabetes: - Currently uncontrolled to goal <6.5%;  - Recommend to increase Trulicity to 1.5 mg weekly. Discussed  risk of increased GI effects. Patient verbalizes understanding.     Follow Up Plan: phone call in ~ 4 weeks  Catie TJodi Mourning, PharmD, [facility] [phone]

## 2022-01-05 ENCOUNTER — Encounter: Payer: Self-pay | Admitting: Internal Medicine

## 2022-01-05 ENCOUNTER — Other Ambulatory Visit: Payer: Self-pay

## 2022-01-05 MED ORDER — PROGESTERONE 200 MG PO CAPS: 200.0000 mg | ORAL_CAPSULE | Freq: Every day | ORAL | 1 refills | Status: DC

## 2022-01-06 ENCOUNTER — Encounter: Payer: Self-pay | Admitting: Family Medicine

## 2022-01-06 ENCOUNTER — Ambulatory Visit: Payer: 59 | Admitting: Family Medicine

## 2022-01-06 VITALS — BP 122/80 | HR 74 | Ht 69.0 in | Wt 212.0 lb

## 2022-01-06 DIAGNOSIS — M9902 Segmental and somatic dysfunction of thoracic region: Secondary | ICD-10-CM | POA: Diagnosis not present

## 2022-01-06 DIAGNOSIS — M9903 Segmental and somatic dysfunction of lumbar region: Secondary | ICD-10-CM

## 2022-01-06 DIAGNOSIS — M5412 Radiculopathy, cervical region: Secondary | ICD-10-CM | POA: Diagnosis not present

## 2022-01-06 DIAGNOSIS — M9901 Segmental and somatic dysfunction of cervical region: Secondary | ICD-10-CM

## 2022-01-06 DIAGNOSIS — M9904 Segmental and somatic dysfunction of sacral region: Secondary | ICD-10-CM

## 2022-01-06 DIAGNOSIS — M9908 Segmental and somatic dysfunction of rib cage: Secondary | ICD-10-CM

## 2022-01-06 NOTE — Assessment & Plan Note (Signed)
Improved from previous symptomatology.  Still has cervicalgia that we will need to continue to monitor.  Patient likely is not having any radicular pain.  Which responded well to the Celebrex 200 mg.  We discussed the gabapentin at 200 mg at night as well patient to continue follow-up with home exercises, ergonomics and follow-up with me again in 4 to 6 weeks

## 2022-01-06 NOTE — Patient Instructions (Signed)
If you need a refill you know where I am  6 week follow up

## 2022-01-11 ENCOUNTER — Other Ambulatory Visit: Payer: Self-pay | Admitting: Internal Medicine

## 2022-01-20 DIAGNOSIS — R69 Illness, unspecified: Secondary | ICD-10-CM | POA: Diagnosis not present

## 2022-01-26 ENCOUNTER — Other Ambulatory Visit: Payer: 59 | Admitting: Pharmacist

## 2022-01-26 NOTE — Progress Notes (Signed)
01/26/2022 Name: Patricia [name] MRN: [id number] DOB: Feb 17, 1970  Chief Complaint  Patient presents with   Medication Management   Diabetes    Patricia [name] is a 52 y.o. year old female who presented for a telephone visit.   They were referred to the pharmacist by their PCP for assistance in managing diabetes and medication access.   Subjective:  Care Team: Primary Care Provider: [name], MD ; Next Scheduled Visit: 05/10/22  Medication Access/Adherence  Current Pharmacy:  [facility], [place] - [address] [place] [address] Phone: [phone] Fax: [phone]  [facility] [place] AT [address] [place] [address] Phone: [phone] Fax: [id number] [place], [place] - [address] [place] [address] Phone: [phone] Fax: [phone]   Patient reports affordability concerns with their medications: No  Patient reports access/transportation concerns to their pharmacy: No  Patient reports adherence concerns with their medications:  No     Diabetes:  Current medications: Trulicity 1.5 mg weekly x 4 weeks Medications tried in the past: Victoza   Reports she is now experiencing bloating with Trulicity. Happens for about 4-5 days after dose, resolves. Denies constipation, notes a regular bowel movement daily. Notes adequate hydration. Denies abdominal pain.   Reports continued focus on lean protein, fruits and vegetables, whole grains. Exercises as able, though notes that activity is limited due to chronic pain. Taking more celecoxib lately, notes this can cause more GI distress so has also been taking omeprazole daily.    Objective:  Lab Results  Component Value Date   HGBA1C 6.8 (H) 11/05/2021    Lab  Results  Component Value Date   CREATININE 0.61 11/05/2021   BUN 11 11/05/2021   NA 137 11/05/2021   K 3.8 11/05/2021   CL 101 11/05/2021   CO2 29 11/05/2021    Lab Results  Component Value Date   CHOL 257 (H) 11/05/2021   HDL 45.20 11/05/2021   LDLCALC 115 (H) 07/06/2021   LDLDIRECT 181.0 11/05/2021   TRIG 222.0 (H) 11/05/2021   CHOLHDL 6 11/05/2021    Medications Reviewed Today     Reviewed by Osker Mason, RPH-CPP (Pharmacist) on 01/26/22 at 1622  Med List Status: <None>   Medication Order Taking? Sig Documenting Provider Last Dose Status Informant  acetaminophen (TYLENOL) 500 MG tablet [id number] No Take 500 mg by mouth every 6 (six) hours as needed. Taking 3,000 mg daily [provider] Taking Active   albuterol (VENTOLIN HFA) 108 (90 Base) MCG/ACT inhaler [id number] No Inhale 2 puffs into the lungs every 6 (six) hours as needed for wheezing or shortness of breath. [name], MD Taking Active   Discontinued 01/26/22 1622 (Completed Course)   aspirin EC 81 MG tablet [id number] No Take 81 mg by mouth daily. Swallow whole. [provider] Taking Active   Berberine Chloride (BERBERINE HCI PO) [id number] No Take by mouth. [provider] Taking Active   Biotin 1000 MCG tablet [id number] No Take 1,000 mcg by mouth daily. [provider] Taking Active Self  butalbital-acetaminophen-caffeine (FIORICET) 50-325-40 MG tablet [id number] No TAKE ONE TABLET BY MOUTH EVERY 6 HOURS AS NEEDED FOR PAIN [name], MD Taking Active   celecoxib (CELEBREX)  200 MG capsule [id number]  TAKE 1 CAPSULE BY MOUTH 2 TIMES DAILY. [name], MD  Active   Cholecalciferol (VITAMIN D) 50 MCG (2000 UT) tablet [id number] No Take 2,000 Units by mouth daily. [provider] Taking Active Self  cyanocobalamin (,VITAMIN B-12,) 1000 MCG/ML injection [id number] No Inject 1 mL (1,000 mcg total) into the muscle every 30 (thirty) days. [name], MD Taking  Active   cyclobenzaprine (FLEXERIL) 10 MG tablet [id number] No Take 1 tablet (10 mg total) by mouth 3 (three) times daily as needed for muscle spasms. [name], DO Taking Active Self  Discontinued 01/26/22 1622 (Completed Course)   Dulaglutide (TRULICITY) 1.5 TD/9.7CB SOPN [id number] No Inject 1.5 mg into the skin once a week. [name], MD Taking Active   gabapentin (NEURONTIN) 100 MG capsule [id number] No Take 2 capsules (200 mg total) by mouth at bedtime. [name], DO Taking Active   levocetirizine (XYZAL) 5 MG tablet [id number] No Take 1 tablet (5 mg total) by mouth every evening. [name], MD Taking Active   losartan-hydrochlorothiazide [facility]) 100-25 MG tablet [id number] No Take 1 tablet by mouth daily. [name], MD Taking Active   metoprolol tartrate (LOPRESSOR) 25 MG tablet [id number] No TAKE 0.5 TABLET TO 1 TABLET BY MOUTH TWICE DAILY AS NEEDED FOR PALPITAIONS [name], MD Taking Active   montelukast (SINGULAIR) 10 MG tablet [id number] No TAKE ONE TABLET AT BEDTIME [name], MD Taking Active   omeprazole (PRILOSEC) 40 MG capsule [id number] No TAKE 1 CAPSULE BY MOUTH DAILY. [name], MD Taking Active            Med Note Jodi Mourning, Grace Bushy   Fri Nov 26, 2021 10:00 AM) PRN  OVER THE COUNTER MEDICATION [id number] No Take 1,500 mg by mouth daily. Calcium Pyurbate [provider] Taking Active Self    Discontinued 01/26/22 1622 (Completed Course)   progesterone (PROMETRIUM) 200 MG capsule [id number] No Take 1 capsule (200 mg total) by mouth daily. [name], MD Taking Active   rosuvastatin (CRESTOR) 10 MG tablet [id number] No Take 1 tablet (10 mg total) by mouth daily. [name], MD Taking Active   Med List Note Dewayne Shorter, RN 03/11/20 1422): 03-10-2020  Message sent to Lars Mage to get approval to stop Xarelto for 3 days for a procedure 03-11-2020 message received that patient could not stop her Xarelto until after  05-16-2020.  See note in telephone calls.  kt               Assessment/Plan:   Diabetes: - Currently uncontrolled and unable to achieve goal of 5-10% loss of weight with GLP1 therapy. Unable to increase dose at this time due to medication-related bloating.  - Will attempt prior authorization for [facility] now that patient has tried and failed the two insurance preferred alternatives. PA submitted via Cover My Meds.  - Encouraged continued focus on lean proteins, fruits and vegetables, whole grains and increased fiber consumption, adequate hydration - Encourages eventual goal of 150 minutes of moderate intensity exercise weekly as tolerated - Provided motivational interviewing. Discussed setting non-weight based goals  Follow Up Plan: follow PA  Catie Hedwig Morton, PharmD, [place], [facility] [phone]

## 2022-01-28 ENCOUNTER — Other Ambulatory Visit: Payer: 59

## 2022-01-28 ENCOUNTER — Encounter: Payer: Self-pay | Admitting: Internal Medicine

## 2022-01-28 ENCOUNTER — Telehealth: Payer: Self-pay

## 2022-01-28 NOTE — Telephone Encounter (Signed)
Pt using Mounjaro - advised PA approved

## 2022-01-28 NOTE — Telephone Encounter (Signed)
Advised pt through her mychart that Patricia [name] has been approved.

## 2022-01-31 ENCOUNTER — Other Ambulatory Visit: Payer: Self-pay | Admitting: Pharmacist

## 2022-01-31 MED ORDER — TIRZEPATIDE 2.5 MG/0.5ML ~~LOC~~ SOAJ: 2.5000 mg | SUBCUTANEOUS | 2 refills | Status: DC

## 2022-01-31 NOTE — Telephone Encounter (Signed)
Mounjaro sent to pharmacy

## 2022-01-31 NOTE — Telephone Encounter (Signed)
Patricia [name] has not been sent in to pharmacy. Medication has been approved.

## 2022-01-31 NOTE — Telephone Encounter (Signed)
Pt called stating the pharmacy has not received the mounjaro yet

## 2022-02-01 ENCOUNTER — Other Ambulatory Visit (INDEPENDENT_AMBULATORY_CARE_PROVIDER_SITE_OTHER): Payer: 59

## 2022-02-01 DIAGNOSIS — E785 Hyperlipidemia, unspecified: Secondary | ICD-10-CM

## 2022-02-01 DIAGNOSIS — E1169 Type 2 diabetes mellitus with other specified complication: Secondary | ICD-10-CM | POA: Diagnosis not present

## 2022-02-02 LAB — HEPATIC FUNCTION PANEL
ALT: 15 IU/L (ref 0–32)
AST: 13 IU/L (ref 0–40)
Albumin: 3.9 g/dL (ref 3.8–4.9)
Alkaline Phosphatase: 60 IU/L (ref 44–121)
Bilirubin Total: 0.4 mg/dL (ref 0.0–1.2)
Bilirubin, Direct: 0.12 mg/dL (ref 0.00–0.40)
Total Protein: 6.5 g/dL (ref 6.0–8.5)

## 2022-02-09 ENCOUNTER — Ambulatory Visit: Payer: 59 | Admitting: Family Medicine

## 2022-02-09 ENCOUNTER — Encounter: Payer: Self-pay | Admitting: Internal Medicine

## 2022-02-10 ENCOUNTER — Other Ambulatory Visit: Payer: Self-pay

## 2022-02-10 MED ORDER — PROGESTERONE 200 MG PO CAPS: 200.0000 mg | ORAL_CAPSULE | Freq: Every day | ORAL | 1 refills | Status: DC

## 2022-02-10 MED ORDER — CYANOCOBALAMIN 1000 MCG/ML IJ SOLN: 1000.0000 ug | INTRAMUSCULAR | 3 refills | Status: DC

## 2022-02-15 DIAGNOSIS — Z86018 Personal history of other benign neoplasm: Secondary | ICD-10-CM | POA: Diagnosis not present

## 2022-02-15 DIAGNOSIS — Z808 Family history of malignant neoplasm of other organs or systems: Secondary | ICD-10-CM | POA: Diagnosis not present

## 2022-02-15 DIAGNOSIS — Z872 Personal history of diseases of the skin and subcutaneous tissue: Secondary | ICD-10-CM | POA: Diagnosis not present

## 2022-02-15 DIAGNOSIS — A630 Anogenital (venereal) warts: Secondary | ICD-10-CM | POA: Diagnosis not present

## 2022-02-15 DIAGNOSIS — D485 Neoplasm of uncertain behavior of skin: Secondary | ICD-10-CM | POA: Diagnosis not present

## 2022-02-15 DIAGNOSIS — L578 Other skin changes due to chronic exposure to nonionizing radiation: Secondary | ICD-10-CM | POA: Diagnosis not present

## 2022-02-15 DIAGNOSIS — R69 Illness, unspecified: Secondary | ICD-10-CM | POA: Diagnosis not present

## 2022-02-17 ENCOUNTER — Encounter (HOSPITAL_COMMUNITY): Payer: Self-pay | Admitting: *Deleted

## 2022-02-23 ENCOUNTER — Encounter: Payer: Self-pay | Admitting: Pharmacist

## 2022-02-24 ENCOUNTER — Other Ambulatory Visit: Payer: Self-pay | Admitting: Pharmacist

## 2022-02-24 MED ORDER — TIRZEPATIDE 5 MG/0.5ML ~~LOC~~ SOAJ: 5.0000 mg | SUBCUTANEOUS | 2 refills | Status: DC

## 2022-02-24 NOTE — Progress Notes (Signed)
Care Coordination Call  Patient reached out via MyChart, noted tolerability of Mounjaro 2.5 mg weekly and denies impact on weight. Will be due to fill before our next call, requests to increase dose.   Order placed for 5 mg dose. Rescheduled follow up.  Catie Hedwig Morton, PharmD, [place], [facility] [phone]

## 2022-02-28 ENCOUNTER — Other Ambulatory Visit: Payer: Self-pay | Admitting: Pharmacist

## 2022-02-28 ENCOUNTER — Other Ambulatory Visit: Payer: 59 | Admitting: Pharmacist

## 2022-02-28 NOTE — Progress Notes (Unsigned)
Care Coordination Call  Received message from patient that PA needed for Mounjaro 5 mg dose.

## 2022-03-01 NOTE — Progress Notes (Signed)
PA approved for Mounjaro 5 mg through 03/01/2023. Contact[facility]; [facility] [address] [address] - notified of PA approval.   Catie Hedwig Morton, PharmD, [place], [facility] [phone]
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
## 2022-03-09 ENCOUNTER — Encounter: Payer: Self-pay | Admitting: Family Medicine

## 2022-03-09 ENCOUNTER — Ambulatory Visit: Payer: 59 | Admitting: Family Medicine

## 2022-03-09 VITALS — BP 102/72 | HR 72 | Ht 69.0 in | Wt 221.0 lb

## 2022-03-09 DIAGNOSIS — M5136 Other intervertebral disc degeneration, lumbar region: Secondary | ICD-10-CM

## 2022-03-09 DIAGNOSIS — M9903 Segmental and somatic dysfunction of lumbar region: Secondary | ICD-10-CM

## 2022-03-09 DIAGNOSIS — M9904 Segmental and somatic dysfunction of sacral region: Secondary | ICD-10-CM | POA: Diagnosis not present

## 2022-03-09 DIAGNOSIS — M9902 Segmental and somatic dysfunction of thoracic region: Secondary | ICD-10-CM

## 2022-03-09 DIAGNOSIS — M9908 Segmental and somatic dysfunction of rib cage: Secondary | ICD-10-CM | POA: Diagnosis not present

## 2022-03-09 MED ORDER — OMEPRAZOLE 40 MG PO CPDR: 40.0000 mg | DELAYED_RELEASE_CAPSULE | Freq: Every day | ORAL | 3 refills | Status: DC

## 2022-03-09 NOTE — Patient Instructions (Addendum)
Omeprazole refilled See me in 7 weeks

## 2022-03-09 NOTE — Assessment & Plan Note (Signed)
Degenerative disc disease noted.  Discussed icing regimen and home exercises.  Patient is continuing to work patient is continuing to be active which I do think is highly helpful.  Follow-up again in 6 to 8 weeks.

## 2022-03-16 ENCOUNTER — Encounter: Payer: Self-pay | Admitting: Family Medicine

## 2022-03-16 ENCOUNTER — Other Ambulatory Visit: Payer: Self-pay

## 2022-03-16 ENCOUNTER — Other Ambulatory Visit: Payer: 59 | Admitting: Pharmacist

## 2022-03-16 MED ORDER — GABAPENTIN 100 MG PO CAPS
200.0000 mg | ORAL_CAPSULE | Freq: Every day | ORAL | 0 refills | Status: DC
Start: 2022-03-16 — End: 2022-04-27

## 2022-03-16 NOTE — Progress Notes (Signed)
Care Coordination Call  Spoke with patient. Endorses tolerability of Mounjaro 5 mg dose, denies significant GI upset, constipation. Denies significant benefit on appetite or weight. Requests to increase to 7.5 mg weekly dose.   PA completed for Mounjaro 7.5 mg weekly.   Catie Hedwig Morton, PharmD, [place], [facility] [phone]

## 2022-03-17 ENCOUNTER — Other Ambulatory Visit: Payer: Self-pay | Admitting: Internal Medicine

## 2022-03-17 MED ORDER — TIRZEPATIDE 7.5 MG/0.5ML ~~LOC~~ SOAJ: 7.5000 mg | SUBCUTANEOUS | 2 refills | Status: DC

## 2022-03-23 ENCOUNTER — Other Ambulatory Visit: Payer: Self-pay | Admitting: Pharmacist

## 2022-03-23 NOTE — Progress Notes (Signed)
   03/23/2022  Patient ID: Patricia [name], female   DOB: 1970-12-27, 52 y.o.   MRN: [id number]  Care Coordination Call  While covering for Catie Jodi Mourning, receive a message from patient requesting assistance with obtaining her Mounjaro 7.5 mg Rx from pharmacy.   [facility]ak with [facility] who attempts to re-run claim for Mounjaro 7.5 mg Rx today and advises claim still rejecting.   Complete PA for Mounjaro 7.5 mg today. PA approved  [facility]. Claim processed successfully through insurance and pharmacy to order medication for patient for tomorrow.  Follow up with patient to provide this update.  Wallace Cullens, PharmD, [facility] [phone]

## 2022-03-23 NOTE — Telephone Encounter (Signed)
Attempted PA through [facility] and was hard stopped with this message. PA resolved

## 2022-03-24 ENCOUNTER — Telehealth: Payer: Self-pay

## 2022-03-24 MED ORDER — TIRZEPATIDE 7.5 MG/0.5ML ~~LOC~~ SOAJ
7.5000 mg | SUBCUTANEOUS | 2 refills | Status: DC
  Filled 2022-03-28: qty 2, 28d supply, fill #0

## 2022-03-24 NOTE — Telephone Encounter (Signed)
Called pt to clarify which weight loss medication she is currently taking. Pt stated that she is taking Mounjaro 7.5 mg. Chart has been updated.

## 2022-03-28 ENCOUNTER — Other Ambulatory Visit: Payer: Self-pay

## 2022-04-11 ENCOUNTER — Encounter: Payer: Self-pay | Admitting: Internal Medicine

## 2022-04-11 DIAGNOSIS — R5383 Other fatigue: Secondary | ICD-10-CM

## 2022-04-11 DIAGNOSIS — E1169 Type 2 diabetes mellitus with other specified complication: Secondary | ICD-10-CM

## 2022-04-11 DIAGNOSIS — E669 Obesity, unspecified: Secondary | ICD-10-CM

## 2022-04-12 ENCOUNTER — Other Ambulatory Visit: Payer: 59 | Admitting: Pharmacist

## 2022-04-18 ENCOUNTER — Telehealth: Payer: Self-pay | Admitting: Internal Medicine

## 2022-04-18 MED ORDER — TIRZEPATIDE 10 MG/0.5ML ~~LOC~~ SOAJ
10.0000 mg | SUBCUTANEOUS | 3 refills | Status: DC
  Filled 2022-04-18: qty 2, 28d supply, fill #0
  Filled 2022-05-23: qty 2, 28d supply, fill #1
  Filled 2022-06-17: qty 2, 28d supply, fill #2
  Filled 2022-07-17: qty 2, 28d supply, fill #3

## 2022-04-18 NOTE — Telephone Encounter (Signed)
Pt called sating  she is calling about her mounjaro medication. Pt feel like she needs to go up to the next dose and wanted to remind the pharmacist that she need a PA for medication increase. Pt pharmacy is [facility]

## 2022-04-18 NOTE — Telephone Encounter (Signed)
Prior authorization needed for 10 mg dose of Mounjaro which has been sent to [facility] pharmacy

## 2022-04-18 NOTE — Telephone Encounter (Signed)
Current dose is 7.5 mg. Has not lost any weight since starting the medication. Would like to see if the next dose can get sent in the [facility] pharmacy.

## 2022-04-19 ENCOUNTER — Other Ambulatory Visit: Payer: Self-pay

## 2022-04-19 NOTE — Telephone Encounter (Signed)
PA needed for 10 mg dose

## 2022-04-20 ENCOUNTER — Other Ambulatory Visit (HOSPITAL_COMMUNITY): Payer: Self-pay

## 2022-04-20 NOTE — Telephone Encounter (Signed)
Some PA's cover all strengths, especially with weight loss/diabetic drugs. Per test claim, no PA required for increased dose. No PA submitted at this time.

## 2022-04-20 NOTE — Telephone Encounter (Signed)
Pt is aware and advised that if she has any trouble getting it filled to let me know. Pt gave a verbal understanding.

## 2022-04-21 ENCOUNTER — Other Ambulatory Visit (INDEPENDENT_AMBULATORY_CARE_PROVIDER_SITE_OTHER): Payer: 59

## 2022-04-21 DIAGNOSIS — E785 Hyperlipidemia, unspecified: Secondary | ICD-10-CM | POA: Diagnosis not present

## 2022-04-21 DIAGNOSIS — R5383 Other fatigue: Secondary | ICD-10-CM

## 2022-04-21 DIAGNOSIS — E1159 Type 2 diabetes mellitus with other circulatory complications: Secondary | ICD-10-CM

## 2022-04-21 DIAGNOSIS — I152 Hypertension secondary to endocrine disorders: Secondary | ICD-10-CM | POA: Diagnosis not present

## 2022-04-21 DIAGNOSIS — E1169 Type 2 diabetes mellitus with other specified complication: Secondary | ICD-10-CM

## 2022-04-21 DIAGNOSIS — E669 Obesity, unspecified: Secondary | ICD-10-CM

## 2022-04-21 LAB — COMPREHENSIVE METABOLIC PANEL
ALT: 12 U/L (ref 0–35)
AST: 13 U/L (ref 0–37)
Albumin: 3.8 g/dL (ref 3.5–5.2)
Alkaline Phosphatase: 61 U/L (ref 39–117)
BUN: 13 mg/dL (ref 6–23)
CO2: 28 mEq/L (ref 19–32)
Calcium: 8.9 mg/dL (ref 8.4–10.5)
Chloride: 98 mEq/L (ref 96–112)
Creatinine, Ser: 0.77 mg/dL (ref 0.40–1.20)
GFR: 88.66 mL/min (ref >60.00)
Glucose, Bld: 100 mg/dL — ABNORMAL HIGH (ref 70–99)
Potassium: 4 mEq/L (ref 3.5–5.1)
Sodium: 136 mEq/L (ref 135–145)
Total Bilirubin: 0.5 mg/dL (ref 0.2–1.2)
Total Protein: 6.5 g/dL (ref 6.0–8.3)

## 2022-04-21 LAB — LIPID PANEL
Cholesterol: 205 mg/dL — ABNORMAL HIGH (ref 0–200)
HDL: 40.4 mg/dL (ref >39.00)
NonHDL: 164.56
Total CHOL/HDL Ratio: 5
Triglycerides: 214 mg/dL — ABNORMAL HIGH (ref 0.0–149.0)
VLDL: 42.8 mg/dL — ABNORMAL HIGH (ref 0.0–40.0)

## 2022-04-21 LAB — CBC WITH DIFFERENTIAL/PLATELET
Basophils Absolute: 0.1 10*3/uL (ref 0.0–0.1)
Basophils Relative: 0.8 % (ref 0.0–3.0)
Eosinophils Absolute: 0.3 10*3/uL (ref 0.0–0.7)
Eosinophils Relative: 3.1 % (ref 0.0–5.0)
HCT: 37.9 % (ref 36.0–46.0)
Hemoglobin: 12.4 g/dL (ref 12.0–15.0)
Lymphocytes Relative: 30.4 % (ref 12.0–46.0)
Lymphs Abs: 2.6 10*3/uL (ref 0.7–4.0)
MCHC: 32.8 g/dL (ref 30.0–36.0)
MCV: 82.8 fl (ref 78.0–100.0)
Monocytes Absolute: 0.7 10*3/uL (ref 0.1–1.0)
Monocytes Relative: 8 % (ref 3.0–12.0)
Neutro Abs: 5 10*3/uL (ref 1.4–7.7)
Neutrophils Relative %: 57.7 % (ref 43.0–77.0)
Platelets: 324 10*3/uL (ref 150.0–400.0)
RBC: 4.58 Mil/uL (ref 3.87–5.11)
RDW: 15.8 % — ABNORMAL HIGH (ref 11.5–15.5)
WBC: 8.6 10*3/uL (ref 4.0–10.5)

## 2022-04-21 LAB — MICROALBUMIN / CREATININE URINE RATIO
Creatinine,U: 262.4 mg/dL
Microalb Creat Ratio: 0.3 mg/g (ref 0.0–30.0)
Microalb, Ur: 0.8 mg/dL (ref 0.0–1.9)

## 2022-04-21 LAB — HEMOGLOBIN A1C: Hgb A1c MFr Bld: 6.2 % (ref 4.6–6.5)

## 2022-04-21 LAB — TSH: TSH: 0.86 u[IU]/mL (ref 0.35–5.50)

## 2022-04-21 LAB — LDL CHOLESTEROL, DIRECT: Direct LDL: 134 mg/dL
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
## 2022-04-27 ENCOUNTER — Ambulatory Visit: Payer: 59 | Admitting: Family Medicine

## 2022-04-27 VITALS — BP 108/76 | HR 86 | Ht 69.0 in | Wt 219.0 lb

## 2022-04-27 DIAGNOSIS — M9902 Segmental and somatic dysfunction of thoracic region: Secondary | ICD-10-CM

## 2022-04-27 DIAGNOSIS — M7062 Trochanteric bursitis, left hip: Secondary | ICD-10-CM

## 2022-04-27 DIAGNOSIS — M9901 Segmental and somatic dysfunction of cervical region: Secondary | ICD-10-CM | POA: Diagnosis not present

## 2022-04-27 DIAGNOSIS — M5136 Other intervertebral disc degeneration, lumbar region: Secondary | ICD-10-CM | POA: Diagnosis not present

## 2022-04-27 DIAGNOSIS — M9903 Segmental and somatic dysfunction of lumbar region: Secondary | ICD-10-CM

## 2022-04-27 DIAGNOSIS — M9908 Segmental and somatic dysfunction of rib cage: Secondary | ICD-10-CM

## 2022-04-27 DIAGNOSIS — M9904 Segmental and somatic dysfunction of sacral region: Secondary | ICD-10-CM

## 2022-04-27 MED ORDER — GABAPENTIN 100 MG PO CAPS
200.0000 mg | ORAL_CAPSULE | Freq: Every day | ORAL | 0 refills | Status: DC
Start: 2022-04-27 — End: 2022-06-15

## 2022-04-27 NOTE — Patient Instructions (Addendum)
Injected L GT today Gabapentin refilled   Titrate down as possible See me in 2 months

## 2022-04-28 ENCOUNTER — Encounter: Payer: Self-pay | Admitting: Family Medicine

## 2022-04-28 NOTE — Assessment & Plan Note (Signed)
Patient given injection and tolerated the procedure well, discussed icing regimen and home exercises.  I discussed hip abductor strengthening exercises.  Differential includes lumbar radiculopathy and we will monitor to see if there is any other symptoms that could be potentially contributing.  Follow-up with me again in 6 to 8 weeks otherwise.

## 2022-04-28 NOTE — Assessment & Plan Note (Signed)
Known degenerative disc disease.  Will monitor for any worsening radicular symptoms.  Has had difficulty with multiple over the course of time and we will need to discuss this because patient has had the allergies as well as serotonin syndrome Effexor.  Avoiding all other SSRIs at the moment.  Discussed which activities such as hip abductor strengthening would be helpful as well.

## 2022-05-04 ENCOUNTER — Other Ambulatory Visit: Payer: Self-pay | Admitting: Internal Medicine

## 2022-05-04 ENCOUNTER — Encounter: Payer: Self-pay | Admitting: Internal Medicine

## 2022-05-04 DIAGNOSIS — N390 Urinary tract infection, site not specified: Secondary | ICD-10-CM

## 2022-05-04 DIAGNOSIS — N3001 Acute cystitis with hematuria: Secondary | ICD-10-CM

## 2022-05-04 HISTORY — DX: Urinary tract infection, site not specified: N39.0

## 2022-05-04 MED ORDER — NITROFURANTOIN MONOHYD MACRO 100 MG PO CAPS: 100.0000 mg | ORAL_CAPSULE | Freq: Two times a day (BID) | ORAL | 0 refills | Status: DC

## 2022-05-04 NOTE — Assessment & Plan Note (Addendum)
[facility] .    POC UA at work was highly suggestive

## 2022-05-05 NOTE — Telephone Encounter (Signed)
Spoke with pt and she stated that she saw the nurse at her work and was prescribed Macrobid.

## 2022-05-10 ENCOUNTER — Ambulatory Visit: Payer: 59 | Admitting: Internal Medicine

## 2022-05-10 ENCOUNTER — Encounter: Payer: Self-pay | Admitting: Internal Medicine

## 2022-05-10 VITALS — BP 114/68 | HR 93 | Ht 69.0 in | Wt 211.0 lb

## 2022-05-10 DIAGNOSIS — I152 Hypertension secondary to endocrine disorders: Secondary | ICD-10-CM | POA: Diagnosis not present

## 2022-05-10 DIAGNOSIS — N390 Urinary tract infection, site not specified: Secondary | ICD-10-CM | POA: Diagnosis not present

## 2022-05-10 DIAGNOSIS — E1159 Type 2 diabetes mellitus with other circulatory complications: Secondary | ICD-10-CM | POA: Diagnosis not present

## 2022-05-10 DIAGNOSIS — I10 Essential (primary) hypertension: Secondary | ICD-10-CM | POA: Diagnosis not present

## 2022-05-10 DIAGNOSIS — M791 Myalgia, unspecified site: Secondary | ICD-10-CM | POA: Diagnosis not present

## 2022-05-10 DIAGNOSIS — H65192 Other acute nonsuppurative otitis media, left ear: Secondary | ICD-10-CM | POA: Diagnosis not present

## 2022-05-10 DIAGNOSIS — N3001 Acute cystitis with hematuria: Secondary | ICD-10-CM | POA: Diagnosis not present

## 2022-05-10 DIAGNOSIS — E1169 Type 2 diabetes mellitus with other specified complication: Secondary | ICD-10-CM | POA: Diagnosis not present

## 2022-05-10 DIAGNOSIS — T466X5A Adverse effect of antihyperlipidemic and antiarteriosclerotic drugs, initial encounter: Secondary | ICD-10-CM

## 2022-05-10 DIAGNOSIS — E669 Obesity, unspecified: Secondary | ICD-10-CM

## 2022-05-10 DIAGNOSIS — Z7985 Long-term (current) use of injectable non-insulin antidiabetic drugs: Secondary | ICD-10-CM

## 2022-05-10 DIAGNOSIS — E785 Hyperlipidemia, unspecified: Secondary | ICD-10-CM

## 2022-05-10 LAB — POCT URINALYSIS DIPSTICK
Bilirubin, UA: NEGATIVE
Blood, UA: NEGATIVE
Glucose, UA: NEGATIVE
Ketones, UA: NEGATIVE
Leukocytes, UA: NEGATIVE
Nitrite, UA: NEGATIVE
Protein, UA: NEGATIVE
Spec Grav, UA: 1.015 (ref 1.010–1.025)
Urobilinogen, UA: 0.2 E.U./dL
pH, UA: 6.5 (ref 5.0–8.0)

## 2022-05-10 MED ORDER — LOSARTAN POTASSIUM-HCTZ 100-25 MG PO TABS: 1.0000 | ORAL_TABLET | Freq: Every day | ORAL | 1 refills | Status: DC

## 2022-05-10 MED ORDER — CELECOXIB 200 MG PO CAPS: 200.0000 mg | ORAL_CAPSULE | Freq: Two times a day (BID) | ORAL | 0 refills | Status: DC

## 2022-05-10 MED ORDER — CEFDINIR 300 MG PO CAPS
300.0000 mg | ORAL_CAPSULE | Freq: Two times a day (BID) | ORAL | 0 refills | Status: DC
Start: 2022-05-10 — End: 2022-08-17

## 2022-05-10 MED ORDER — ROSUVASTATIN CALCIUM 10 MG PO TABS: 10.0000 mg | ORAL_TABLET | ORAL | 1 refills | Status: DC

## 2022-05-10 MED ORDER — PREDNISONE 10 MG PO TABS: ORAL_TABLET | ORAL | 0 refills | Status: DC

## 2022-05-10 MED ORDER — LOSARTAN POTASSIUM 100 MG PO TABS
100.0000 mg | ORAL_TABLET | Freq: Every day | ORAL | 1 refills | Status: DC
Start: 2022-05-10 — End: 2022-08-17

## 2022-05-10 NOTE — Progress Notes (Signed)
uri

## 2022-05-10 NOTE — Assessment & Plan Note (Signed)
She lost 35 lbs with Wegovy,  and is now taking Mounjaro 10 mg weekly .    Trial of metformin XR in 2018 caused uncontrollable diarrhea. Has been off of wegovy since April 25  And has gained 5 lbs since then . She has significant barriers to vigorous exercise due to multiple chronic and acute orthopedic problems , including prior cervical spine fusion,  Acute  plantar fasciitis and posterior  tibial tendonitis

## 2022-05-10 NOTE — Assessment & Plan Note (Signed)
Using [facility]

## 2022-05-10 NOTE — Assessment & Plan Note (Signed)
intolerant of metformin second ro persistent diarrhea.  Gaining weight after stopping Wegovy    Lost 35 lbs on WEgovy .  Now taking Mounjaro,  currently at 10 mg weekly with 9 lb weight loss in 2 weeks. .  Willing to resume statin every other day. Continue ARB

## 2022-05-10 NOTE — Assessment & Plan Note (Signed)
Continue ARB,  separate from hctz.   Lab Results  Component Value Date   HGBA1C 6.2 04/21/2022   Lab Results  Component Value Date   LABMICR Comment 01/07/2016   MICROALBUR 0.8 04/21/2022   MICROALBUR <0.7 07/06/2021     Lab Results  Component Value Date   CHOL 205 (H) 04/21/2022   HDL 40.40 04/21/2022   LDLCALC 115 (H) 07/06/2021   LDLDIRECT 134.0 04/21/2022   TRIG 214.0 (H) 04/21/2022   CHOLHDL 5 04/21/2022

## 2022-05-10 NOTE — Assessment & Plan Note (Signed)
Left ear.  Omnicef and prednisone taper

## 2022-05-10 NOTE — Patient Instructions (Addendum)
Your cholesterol I has improved;  but please try a repeat trial of  Crestor every other day   One ounce of pickle juice daily to prevent muscle cramps !  Omnicef and prednisone for your left otitis

## 2022-05-10 NOTE — Assessment & Plan Note (Addendum)
She was treated empirically with macrobid last week for symptoms that have not completely resolved. Rechecking Urine and culture
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
## 2022-05-11 LAB — URINALYSIS, ROUTINE W REFLEX MICROSCOPIC
Bilirubin Urine: NEGATIVE
Hgb urine dipstick: NEGATIVE
Ketones, ur: NEGATIVE
Leukocytes,Ua: NEGATIVE
Nitrite: NEGATIVE
RBC / HPF: NONE SEEN (ref 0–?)
Specific Gravity, Urine: <=1.005 — AB (ref 1.000–1.030)
Total Protein, Urine: NEGATIVE
Urine Glucose: NEGATIVE
Urobilinogen, UA: 0.2 (ref 0.0–1.0)
WBC, UA: NONE SEEN (ref 0–?)
pH: 6.5 (ref 5.0–8.0)

## 2022-05-11 LAB — URINE CULTURE
MICRO NUMBER:: 14892956
Result:: NO GROWTH
SPECIMEN QUALITY:: ADEQUATE

## 2022-05-23 ENCOUNTER — Other Ambulatory Visit: Payer: Self-pay

## 2022-05-24 ENCOUNTER — Other Ambulatory Visit: Payer: Self-pay

## 2022-06-15 ENCOUNTER — Other Ambulatory Visit: Payer: Self-pay | Admitting: Family Medicine

## 2022-06-15 MED ORDER — GABAPENTIN 100 MG PO CAPS: 200.0000 mg | ORAL_CAPSULE | Freq: Every day | ORAL | 0 refills | Status: DC

## 2022-06-17 ENCOUNTER — Other Ambulatory Visit: Payer: Self-pay
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
## 2022-06-28 ENCOUNTER — Encounter: Payer: Self-pay | Admitting: Family Medicine

## 2022-06-28 ENCOUNTER — Ambulatory Visit: Payer: 59 | Admitting: Family Medicine

## 2022-06-28 ENCOUNTER — Telehealth: Payer: Self-pay | Admitting: Internal Medicine

## 2022-06-28 VITALS — BP 110/82 | HR 60 | Ht 69.0 in | Wt 208.0 lb

## 2022-06-28 DIAGNOSIS — M9901 Segmental and somatic dysfunction of cervical region: Secondary | ICD-10-CM | POA: Diagnosis not present

## 2022-06-28 DIAGNOSIS — Z1231 Encounter for screening mammogram for malignant neoplasm of breast: Secondary | ICD-10-CM

## 2022-06-28 DIAGNOSIS — M9902 Segmental and somatic dysfunction of thoracic region: Secondary | ICD-10-CM

## 2022-06-28 DIAGNOSIS — M5136 Other intervertebral disc degeneration, lumbar region: Secondary | ICD-10-CM

## 2022-06-28 DIAGNOSIS — M9908 Segmental and somatic dysfunction of rib cage: Secondary | ICD-10-CM

## 2022-06-28 DIAGNOSIS — M9904 Segmental and somatic dysfunction of sacral region: Secondary | ICD-10-CM | POA: Diagnosis not present

## 2022-06-28 DIAGNOSIS — M9903 Segmental and somatic dysfunction of lumbar region: Secondary | ICD-10-CM

## 2022-06-28 NOTE — Patient Instructions (Signed)
Good to see you! See you again in 2 months Enjoy the beach

## 2022-06-28 NOTE — Telephone Encounter (Signed)
Pt would like a mammogram order because she stated it was time

## 2022-06-28 NOTE — Telephone Encounter (Signed)
Order has been placed and pt is aware.  

## 2022-06-28 NOTE — Assessment & Plan Note (Signed)
Patient did these.  Discussed increase activity slowly.  Has responded well to osteopathic manipulation.  Still have to be significantly difficult with medications with patient having serotonin syndrome.    Follow-up again in 6 to 8 weeks

## 2022-07-11 ENCOUNTER — Other Ambulatory Visit: Payer: Self-pay | Admitting: Internal Medicine

## 2022-08-02 ENCOUNTER — Other Ambulatory Visit: Payer: Self-pay | Admitting: Internal Medicine

## 2022-08-04 ENCOUNTER — Other Ambulatory Visit: Payer: Self-pay | Admitting: Internal Medicine

## 2022-08-04 ENCOUNTER — Other Ambulatory Visit: Payer: Self-pay

## 2022-08-04 MED FILL — Tirzepatide Soln Auto-injector 10 MG/0.5ML: SUBCUTANEOUS | 28 days supply | Qty: 2 | Fill #0 | Status: AC

## 2022-08-08 ENCOUNTER — Other Ambulatory Visit: Payer: Self-pay

## 2022-08-12 ENCOUNTER — Ambulatory Visit
Admission: RE | Admit: 2022-08-12 | Discharge: 2022-08-12 | Disposition: A | Discharge status: Home / Self Care | Payer: 59 | Source: Ambulatory Visit | Attending: Internal Medicine | Admitting: Internal Medicine

## 2022-08-12 DIAGNOSIS — Z1231 Encounter for screening mammogram for malignant neoplasm of breast: Secondary | ICD-10-CM | POA: Diagnosis not present

## 2022-08-17 ENCOUNTER — Encounter: Payer: Self-pay | Admitting: Internal Medicine

## 2022-08-17 ENCOUNTER — Ambulatory Visit (INDEPENDENT_AMBULATORY_CARE_PROVIDER_SITE_OTHER): Payer: 59 | Admitting: Internal Medicine

## 2022-08-17 VITALS — BP 116/78 | HR 84 | Temp 98.0°F | Ht 69.0 in | Wt 198.0 lb

## 2022-08-17 DIAGNOSIS — T466X5A Adverse effect of antihyperlipidemic and antiarteriosclerotic drugs, initial encounter: Secondary | ICD-10-CM | POA: Diagnosis not present

## 2022-08-17 DIAGNOSIS — Z0000 Encounter for general adult medical examination without abnormal findings: Secondary | ICD-10-CM

## 2022-08-17 DIAGNOSIS — I152 Hypertension secondary to endocrine disorders: Secondary | ICD-10-CM

## 2022-08-17 DIAGNOSIS — E785 Hyperlipidemia, unspecified: Secondary | ICD-10-CM | POA: Diagnosis not present

## 2022-08-17 DIAGNOSIS — Z23 Encounter for immunization: Secondary | ICD-10-CM | POA: Diagnosis not present

## 2022-08-17 DIAGNOSIS — Z7985 Long-term (current) use of injectable non-insulin antidiabetic drugs: Secondary | ICD-10-CM | POA: Diagnosis not present

## 2022-08-17 DIAGNOSIS — E669 Obesity, unspecified: Secondary | ICD-10-CM | POA: Diagnosis not present

## 2022-08-17 DIAGNOSIS — M791 Myalgia, unspecified site: Secondary | ICD-10-CM | POA: Diagnosis not present

## 2022-08-17 DIAGNOSIS — E1169 Type 2 diabetes mellitus with other specified complication: Secondary | ICD-10-CM

## 2022-08-17 DIAGNOSIS — E1159 Type 2 diabetes mellitus with other circulatory complications: Secondary | ICD-10-CM

## 2022-08-17 MED ORDER — PROGESTERONE 200 MG PO CAPS: 200.0000 mg | ORAL_CAPSULE | Freq: Every day | ORAL | 1 refills | Status: DC

## 2022-08-17 MED ORDER — BUTALBITAL-APAP-CAFFEINE 50-325-40 MG PO TABS: 1.0000 | ORAL_TABLET | Freq: Four times a day (QID) | ORAL | 2 refills | Status: AC | PRN

## 2022-08-17 MED ORDER — LOSARTAN POTASSIUM 100 MG PO TABS: 100.0000 mg | ORAL_TABLET | Freq: Every day | ORAL | 1 refills | Status: DC

## 2022-08-17 MED ORDER — TRIAMCINOLONE ACETONIDE 55 MCG/ACT NA AERO: 2.0000 | INHALATION_SPRAY | Freq: Every day | NASAL | 12 refills | Status: DC

## 2022-08-17 MED ORDER — MONTELUKAST SODIUM 10 MG PO TABS: 10.0000 mg | ORAL_TABLET | Freq: Every day | ORAL | 3 refills | Status: DC

## 2022-08-17 MED ORDER — LEVOCETIRIZINE DIHYDROCHLORIDE 5 MG PO TABS: 5.0000 mg | ORAL_TABLET | Freq: Every evening | ORAL | 1 refills | Status: DC

## 2022-08-17 NOTE — Progress Notes (Addendum)
Patient ID: Patricia [name], female    DOB: 1970/04/29  Age: 52 y.o. MRN: [id number]  The patient is here for annual preventive examination and management of other chronic and acute problems.   The risk factors are reflected in the social history.   The roster of all physicians providing medical care to patient - is listed in the Snapshot section of the chart.   Activities of daily living:  The patient is 100% independent in all ADLs: dressing, toileting, feeding as well as independent mobility   Home safety : The patient has smoke detectors in the home. They wear seatbelts.  There are no unsecured firearms at home. There is no violence in the home.    There is no risks for hepatitis, STDs or HIV. There is no   history of blood transfusion. They have no travel history to infectious disease endemic areas of the world.   The patient has seen their dentist in the last six month. They have seen their eye doctor in the last year. The patinet  denies slight hearing difficulty with regard to whispered voices and some television programs.  They have deferred audiologic testing in the last year.  They do not  have excessive sun exposure. Discussed the need for sun protection: hats, long sleeves and use of sunscreen if there is significant sun exposure.    Diet: the importance of a healthy diet is discussed. They do have a healthy diet.   The benefits of regular aerobic exercise were discussed. The patient  exercises  3 to 5 days per week  for  60 minutes.    Depression screen: there are no signs or vegative symptoms of depression- irritability, change in appetite, anhedonia, sadness/tearfullness.   The following portions of the patient's history were reviewed and updated as appropriate: allergies, current medications, past family history, past medical history,  past surgical history, past social history  and problem list.   Visual acuity was not assessed per patient preference since the patient  has regular follow up with an  ophthalmologist. Hearing and body mass index were assessed and reviewed.    During the course of the visit the patient was educated and counseled about appropriate screening and preventive services including : fall prevention , diabetes screening, nutrition counseling, colorectal cancer screening, and recommended immunizations.    Chief Complaint:  1) obesity/htn taking mounjaro 10 mg weekly.  Down 10 lbs.  Gets hungry on day 5/6  .  Did not tolerate every other day Crestor so she stopped it 3 months       Review of Symptoms  Patient denies headache, fevers, malaise, unintentional weight loss, skin rash, eye pain, sinus congestion and sinus pain, sore throat, dysphagia,  hemoptysis , cough, dyspnea, wheezing, chest pain, palpitations, orthopnea, edema, abdominal pain, nausea, melena, diarrhea, constipation, flank pain, dysuria, hematuria, urinary  Frequency, nocturia, numbness, tingling, seizures,  Focal weakness, Loss of consciousness,  Tremor, insomnia, depression, anxiety, and suicidal ideation.    Physical Exam:  BP 116/78   Pulse 84   Temp 98 F (36.7 C) (Oral)   Ht 5\' 9"  (1.753 m)   Wt 198 lb (89.8 kg)   SpO2 97%   BMI 29.24 kg/m    Physical Exam Vitals reviewed.  Constitutional:      General: She is not in acute distress.    Appearance: Normal appearance. She is normal weight. She is not ill-appearing, toxic-appearing or diaphoretic.  HENT:     Head: Normocephalic.  Eyes:  General: No scleral icterus.       Right eye: No discharge.        Left eye: No discharge.     Conjunctiva/sclera: Conjunctivae normal.  Cardiovascular:     Rate and Rhythm: Normal rate and regular rhythm.     Heart sounds: Normal heart sounds.  Pulmonary:     Effort: Pulmonary effort is normal. No respiratory distress.     Breath sounds: Normal breath sounds.  Musculoskeletal:        General: Normal range of motion.  Skin:    General: Skin is warm and dry.   Neurological:     General: No focal deficit present.     Mental Status: She is alert and oriented to person, place, and time. Mental status is at baseline.  Psychiatric:        Mood and Affect: Mood normal.        Behavior: Behavior normal.        Thought Content: Thought content normal.        Judgment: Judgment normal.    Assessment and Plan: Visit for preventive health examination Assessment & Plan: age appropriate education and counseling updated, referrals for preventative services and immunizations addressed, dietary and smoking counseling addressed, most recent labs reviewed.  I have personally reviewed and have noted:   1) the patient's medical and social history 2) The pt's use of alcohol, tobacco, and illicit drugs 3) The patient's current medications and supplements 4) Functional ability including ADL's, fall risk, home safety risk, hearing and visual impairment 5) Diet and physical activities 6) Evidence for depression or mood disorder 7) The patient's height, weight, and BMI have been recorded in the chart  I have made referrals, and provided counseling and education based on review of the above   Obesity, diabetes, and hypertension syndrome (HCC) Assessment & Plan: She lost 35 lbs with ZOXWRU,  and is now taking Mounjaro 10 mg weekly and is continuining to lose weight.  .    Trial of metformin XR in 2018 caused uncontrollable diarrhea.  No changes to regimen today . BP is well conroled on losartan 100 mg daily.   Lab Results  Component Value Date   HGBA1C 6.1 08/17/2022   Lab Results  Component Value Date   LABMICR Comment 01/07/2016   MICROALBUR 0.8 04/21/2022   MICROALBUR <0.7 07/06/2021       Orders: -     Comprehensive metabolic panel -     Hemoglobin A1c  Hyperlipidemia associated with type 2 diabetes mellitus (HCC) -     Lipid panel -     LDL cholesterol, direct  Myalgia due to statin Assessment & Plan: Did not tolerate every other day   crestor    Need for Tdap vaccination -     Tdap vaccine greater than or equal to 7yo IM  Long-term current use of injectable noninsulin antidiabetic medication Assessment & Plan: Using Mounjaro   Other orders -     Levocetirizine Dihydrochloride; Take 1 tablet (5 mg total) by mouth every evening.  Dispense: 90 tablet; Refill: 1 -     Progesterone; Take 1 capsule (200 mg total) by mouth daily.  Dispense: 90 capsule; Refill: 1 -     Butalbital-APAP-Caffeine; Take 1 tablet by mouth every 6 (six) hours as needed. for pain  Dispense: 90 tablet; Refill: 2 -     Montelukast Sodium; Take 1 tablet (10 mg total) by mouth at bedtime.  Dispense: 90 tablet; Refill: 3 -  Triamcinolone Acetonide; Place 2 sprays into the nose daily.  Dispense: 1 each; Refill: 12 -     Losartan Potassium; Take 1 tablet (100 mg total) by mouth daily.  Dispense: 90 tablet; Refill: 1    Return in about 3 months (around 11/17/2022) for follow up diabetes.  Sherlene Shams, MD

## 2022-08-17 NOTE — Patient Instructions (Signed)
Glad to hear You are doing well!!   If BP starts to drop to 110,  reduce losartan to 50 mg daily

## 2022-08-17 NOTE — Assessment & Plan Note (Signed)
Did not tolerate every other day  crestor

## 2022-08-19 ENCOUNTER — Encounter: Payer: Self-pay | Admitting: Internal Medicine

## 2022-08-19 MED ORDER — LOSARTAN POTASSIUM 100 MG PO TABS: 100.0000 mg | ORAL_TABLET | Freq: Every day | ORAL | 1 refills | Status: DC

## 2022-08-19 MED ORDER — ATORVASTATIN CALCIUM 10 MG PO TABS: 10.0000 mg | ORAL_TABLET | Freq: Every day | ORAL | 3 refills | Status: DC

## 2022-08-19 NOTE — Assessment & Plan Note (Addendum)
She lost 35 lbs with Wegovy,  and is now taking Mounjaro 10 mg weekly and is continuining to lose weight.  .    Trial of metformin XR in 2018 caused uncontrollable diarrhea.  No changes to regimen today . BP is well conroled on losartan 100 mg daily.   Lab Results  Component Value Date   HGBA1C 6.1 08/17/2022   Lab Results  Component Value Date   LABMICR Comment 01/07/2016   MICROALBUR 0.8 04/21/2022   MICROALBUR <0.7 07/06/2021

## 2022-08-19 NOTE — Assessment & Plan Note (Signed)
Irecommend resuming statin

## 2022-08-19 NOTE — Assessment & Plan Note (Signed)
Using [facility]

## 2022-08-19 NOTE — Assessment & Plan Note (Signed)

## 2022-08-19 NOTE — Addendum Note (Signed)
Addended by: Sherlene Shams on: 08/19/2022 02:55 PM   Modules accepted: Orders

## 2022-08-24 ENCOUNTER — Other Ambulatory Visit: Payer: Self-pay | Admitting: Internal Medicine

## 2022-08-24 DIAGNOSIS — E1169 Type 2 diabetes mellitus with other specified complication: Secondary | ICD-10-CM
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
## 2022-08-30 ENCOUNTER — Ambulatory Visit: Payer: 59 | Admitting: Family Medicine

## 2022-08-30 ENCOUNTER — Encounter: Payer: Self-pay | Admitting: Family Medicine

## 2022-08-30 VITALS — BP 122/88 | HR 88 | Ht 69.0 in | Wt 199.0 lb

## 2022-08-30 DIAGNOSIS — M9904 Segmental and somatic dysfunction of sacral region: Secondary | ICD-10-CM | POA: Diagnosis not present

## 2022-08-30 DIAGNOSIS — M9903 Segmental and somatic dysfunction of lumbar region: Secondary | ICD-10-CM | POA: Diagnosis not present

## 2022-08-30 DIAGNOSIS — M9901 Segmental and somatic dysfunction of cervical region: Secondary | ICD-10-CM

## 2022-08-30 DIAGNOSIS — M5136 Other intervertebral disc degeneration, lumbar region: Secondary | ICD-10-CM

## 2022-08-30 DIAGNOSIS — M9902 Segmental and somatic dysfunction of thoracic region: Secondary | ICD-10-CM

## 2022-08-30 DIAGNOSIS — M9908 Segmental and somatic dysfunction of rib cage: Secondary | ICD-10-CM

## 2022-08-30 DIAGNOSIS — M51369 Other intervertebral disc degeneration, lumbar region without mention of lumbar back pain or lower extremity pain: Secondary | ICD-10-CM

## 2022-08-30 NOTE — Assessment & Plan Note (Addendum)
Discussed HEP  Discussed core strength but patient is doing well. No change in meds  RTC in 6-8 weeks.

## 2022-08-30 NOTE — Patient Instructions (Signed)
Good to see you! See you again in 2 months 

## 2022-09-05 MED FILL — Tirzepatide Soln Auto-injector 10 MG/0.5ML: SUBCUTANEOUS | 28 days supply | Qty: 2 | Fill #1 | Status: AC

## 2022-09-06 ENCOUNTER — Other Ambulatory Visit: Payer: Self-pay

## 2022-10-03 MED FILL — Tirzepatide Soln Auto-injector 10 MG/0.5ML: SUBCUTANEOUS | 28 days supply | Qty: 2 | Fill #2 | Status: AC

## 2022-10-13 ENCOUNTER — Other Ambulatory Visit: Payer: Self-pay | Admitting: Internal Medicine

## 2022-10-13 MED ORDER — METHOCARBAMOL 750 MG PO TABS: 750.0000 mg | ORAL_TABLET | Freq: Four times a day (QID) | ORAL | 2 refills | Status: DC | PRN
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
## 2022-11-01 ENCOUNTER — Other Ambulatory Visit: Payer: Self-pay

## 2022-11-01 ENCOUNTER — Encounter: Payer: Self-pay | Admitting: Family Medicine

## 2022-11-01 ENCOUNTER — Ambulatory Visit: Payer: 59 | Admitting: Family Medicine

## 2022-11-01 VITALS — BP 112/82 | HR 85 | Ht 69.0 in | Wt 198.0 lb

## 2022-11-01 DIAGNOSIS — M9903 Segmental and somatic dysfunction of lumbar region: Secondary | ICD-10-CM | POA: Diagnosis not present

## 2022-11-01 DIAGNOSIS — M51362 Other intervertebral disc degeneration, lumbar region with discogenic back pain and lower extremity pain: Secondary | ICD-10-CM

## 2022-11-01 DIAGNOSIS — M9904 Segmental and somatic dysfunction of sacral region: Secondary | ICD-10-CM

## 2022-11-01 DIAGNOSIS — M9908 Segmental and somatic dysfunction of rib cage: Secondary | ICD-10-CM

## 2022-11-01 DIAGNOSIS — M9901 Segmental and somatic dysfunction of cervical region: Secondary | ICD-10-CM

## 2022-11-01 DIAGNOSIS — M9902 Segmental and somatic dysfunction of thoracic region: Secondary | ICD-10-CM | POA: Diagnosis not present

## 2022-11-01 MED FILL — Tirzepatide Soln Auto-injector 10 MG/0.5ML: SUBCUTANEOUS | 28 days supply | Qty: 2 | Fill #3 | Status: AC

## 2022-11-01 NOTE — Assessment & Plan Note (Signed)
Known degenerative disc disease but is doing relatively well.  Avoided HVLA on the neck.  Discussed icing regimen and home exercises, which activities to do and which ones to avoid.  Increase activity slowly.  No changes in medication.  You can do the Robaxin if necessary.  Follow-up again in 6- 8 weeks

## 2022-11-01 NOTE — Patient Instructions (Signed)
Good to see you! I think Robaxin is fine See you again in 2 months

## 2022-11-08 ENCOUNTER — Other Ambulatory Visit: Payer: Self-pay | Admitting: Internal Medicine

## 2022-11-16 ENCOUNTER — Encounter: Payer: Self-pay | Admitting: Internal Medicine

## 2022-11-21 ENCOUNTER — Other Ambulatory Visit: Payer: Self-pay | Admitting: Family Medicine

## 2022-11-21 ENCOUNTER — Other Ambulatory Visit: Payer: Self-pay

## 2022-11-21 ENCOUNTER — Other Ambulatory Visit: Payer: Self-pay | Admitting: Internal Medicine

## 2022-11-21 MED ORDER — GABAPENTIN 100 MG PO CAPS: 200.0000 mg | ORAL_CAPSULE | Freq: Every day | ORAL | 0 refills | Status: DC

## 2022-11-21 MED ORDER — TIRZEPATIDE 12.5 MG/0.5ML ~~LOC~~ SOAJ
12.5000 mg | SUBCUTANEOUS | 1 refills | Status: DC
  Filled 2022-11-21 – 2022-12-09 (×4): qty 2, 28d supply, fill #0
  Filled 2023-01-02: qty 2, 28d supply, fill #1

## 2022-11-21 NOTE — Telephone Encounter (Signed)
Called and spoke with pt to confirm the dose she is currently taking she verbalized 10 mg , I asked her if she was ready to increase to the next dose of 12.5 and she stated she would like to increase to the next dose of 12.5 mg. She stated she would like for it to be sent to the [facility] pharmacy.

## 2022-11-22 ENCOUNTER — Encounter: Payer: Self-pay | Admitting: Internal Medicine

## 2022-11-22 ENCOUNTER — Other Ambulatory Visit: Payer: Self-pay

## 2022-11-22 DIAGNOSIS — Z23 Encounter for immunization: Secondary | ICD-10-CM

## 2022-11-23 ENCOUNTER — Ambulatory Visit: Payer: 59

## 2022-11-29 ENCOUNTER — Other Ambulatory Visit: Payer: Self-pay

## 2022-11-29 ENCOUNTER — Encounter: Payer: Self-pay | Admitting: Internal Medicine

## 2022-11-29 NOTE — Telephone Encounter (Signed)
PA for 12.5 mg Mounjaro needed

## 2022-12-01 ENCOUNTER — Other Ambulatory Visit (HOSPITAL_COMMUNITY): Payer: Self-pay

## 2022-12-02 NOTE — Telephone Encounter (Signed)
Pt would like to know if she can get a refill on the 10mg  while she is waiting on the PA for the 12.5

## 2022-12-04 MED ORDER — MOUNJARO 10 MG/0.5ML ~~LOC~~ SOAJ
10.0000 mg | SUBCUTANEOUS | 0 refills | Status: DC
  Filled 2022-12-06 – 2023-01-24 (×5): qty 2, 28d supply, fill #0

## 2022-12-05 ENCOUNTER — Telehealth: Payer: Self-pay

## 2022-12-05 ENCOUNTER — Other Ambulatory Visit: Payer: Self-pay

## 2022-12-05 ENCOUNTER — Other Ambulatory Visit (HOSPITAL_COMMUNITY): Payer: Self-pay

## 2022-12-05 NOTE — Telephone Encounter (Signed)
Pharmacy Patient Advocate Encounter   Received notification from Patient Advice Request messages that prior authorization for [facility] 12.5ML is required/requested.   Insurance verification completed.   The patient is insured through [employer] .   Per test claim: The current 28 day co-pay is, $1,020.11.  No PA needed at this time. This test claim was processed through [facility]- copay amounts may vary at other pharmacies due to pharmacy/plan contracts, or as the patient moves through the different stages of their insurance plan.     100% PATIENT COINSURANCE([facility]) - PATIENT SHOULD CONTACT HEALTH INSURANCE PLAN WITH QUESTIONS  PRIOR AUTHORIZATION EXPIRES ON 03/23/23

## 2022-12-05 NOTE — Telephone Encounter (Signed)
Patient states she is following-up regarding her request for refill for tirzepatide [facility]) 10 MG/0.5ML Pen.  Patient states her pharmacy has not received it.  I let patient know that receipt of prescription was confirmed by [facility] [address] yesterday.  Patient states they do not have this medication.  Patient states she gets her Greggory Keen from [facility].  Patient states she would like for [place] to please send her prescription to [facility].

## 2022-12-06 ENCOUNTER — Other Ambulatory Visit: Payer: Self-pay | Admitting: Internal Medicine

## 2022-12-06 ENCOUNTER — Other Ambulatory Visit (HOSPITAL_BASED_OUTPATIENT_CLINIC_OR_DEPARTMENT_OTHER): Payer: Self-pay

## 2022-12-06 ENCOUNTER — Other Ambulatory Visit: Payer: Self-pay

## 2022-12-07 ENCOUNTER — Other Ambulatory Visit (HOSPITAL_BASED_OUTPATIENT_CLINIC_OR_DEPARTMENT_OTHER): Payer: Self-pay

## 2022-12-07 ENCOUNTER — Other Ambulatory Visit: Payer: Self-pay

## 2022-12-09 ENCOUNTER — Other Ambulatory Visit: Payer: Self-pay

## 2022-12-14 ENCOUNTER — Other Ambulatory Visit (HOSPITAL_COMMUNITY): Payer: Self-pay
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
## 2022-12-27 ENCOUNTER — Other Ambulatory Visit: Payer: Self-pay

## 2022-12-27 ENCOUNTER — Ambulatory Visit: Payer: 59 | Admitting: Family Medicine

## 2022-12-27 VITALS — BP 128/88 | HR 77 | Ht 69.0 in

## 2022-12-27 DIAGNOSIS — M7602 Gluteal tendinitis, left hip: Secondary | ICD-10-CM | POA: Diagnosis not present

## 2022-12-27 DIAGNOSIS — M51362 Other intervertebral disc degeneration, lumbar region with discogenic back pain and lower extremity pain: Secondary | ICD-10-CM | POA: Diagnosis not present

## 2022-12-27 DIAGNOSIS — M9903 Segmental and somatic dysfunction of lumbar region: Secondary | ICD-10-CM | POA: Diagnosis not present

## 2022-12-27 DIAGNOSIS — M9902 Segmental and somatic dysfunction of thoracic region: Secondary | ICD-10-CM

## 2022-12-27 DIAGNOSIS — M9904 Segmental and somatic dysfunction of sacral region: Secondary | ICD-10-CM

## 2022-12-27 MED ORDER — OMEPRAZOLE 40 MG PO CPDR: 40.0000 mg | DELAYED_RELEASE_CAPSULE | Freq: Every day | ORAL | 3 refills | Status: DC

## 2022-12-27 NOTE — Patient Instructions (Addendum)
Injected hip today Happy Holidays Gabapentin go up to 200mg  for 5 days send message in 2 weeks  See me again in 6-8 weeks

## 2022-12-28 ENCOUNTER — Encounter: Payer: Self-pay | Admitting: Family Medicine

## 2022-12-28 DIAGNOSIS — M7602 Gluteal tendinitis, left hip: Secondary | ICD-10-CM | POA: Insufficient documentation

## 2022-12-28 NOTE — Assessment & Plan Note (Signed)
Injection given today and tolerated the procedure well, discussed icing regimen and home exercises.  If does not seem to make significant improvement unfortunately does seem to be more of a radicular symptoms from the lower back.  Will continue to monitor otherwise.  Follow-up again in 6 to 8 weeks.

## 2022-12-28 NOTE — Assessment & Plan Note (Signed)
Degenerative disc disease.  Discussed icing regimen and home exercises, discussed avoiding certain activities.  Patient is doing well still low.  Follow-up with me again in 6 to 8 weeks otherwise

## 2023-01-12 ENCOUNTER — Other Ambulatory Visit: Payer: Self-pay | Admitting: Internal Medicine

## 2023-01-12 ENCOUNTER — Encounter: Payer: Self-pay | Admitting: Family Medicine

## 2023-01-12 ENCOUNTER — Encounter: Payer: Self-pay | Admitting: Internal Medicine

## 2023-01-12 MED ORDER — ESTRADIOL 0.0375 MG/24HR TD PTWK: 0.0375 mg | MEDICATED_PATCH | TRANSDERMAL | 12 refills | Status: DC

## 2023-01-25 ENCOUNTER — Other Ambulatory Visit: Payer: Self-pay

## 2023-01-31 ENCOUNTER — Other Ambulatory Visit (HOSPITAL_COMMUNITY): Payer: Self-pay

## 2023-01-31 ENCOUNTER — Other Ambulatory Visit: Payer: Self-pay | Admitting: Internal Medicine

## 2023-02-01 ENCOUNTER — Other Ambulatory Visit: Payer: Self-pay

## 2023-02-01 MED ORDER — MOUNJARO 12.5 MG/0.5ML ~~LOC~~ SOAJ
12.5000 mg | SUBCUTANEOUS | 1 refills | Status: DC
  Filled 2023-02-01: qty 2, 28d supply, fill #0
  Filled 2023-02-27: qty 2, 28d supply, fill #1

## 2023-02-05 ENCOUNTER — Other Ambulatory Visit: Payer: Self-pay | Admitting: Internal Medicine
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
## 2023-02-14 ENCOUNTER — Ambulatory Visit: Payer: 59 | Admitting: Family Medicine

## 2023-02-15 ENCOUNTER — Ambulatory Visit: Payer: 59 | Admitting: Family Medicine

## 2023-02-15 VITALS — BP 110/74 | HR 93 | Ht 69.0 in | Wt 193.0 lb

## 2023-02-15 DIAGNOSIS — M9904 Segmental and somatic dysfunction of sacral region: Secondary | ICD-10-CM

## 2023-02-15 DIAGNOSIS — M7602 Gluteal tendinitis, left hip: Secondary | ICD-10-CM

## 2023-02-15 DIAGNOSIS — M9902 Segmental and somatic dysfunction of thoracic region: Secondary | ICD-10-CM | POA: Diagnosis not present

## 2023-02-15 DIAGNOSIS — M9903 Segmental and somatic dysfunction of lumbar region: Secondary | ICD-10-CM

## 2023-02-15 NOTE — Patient Instructions (Signed)
 Nice accessorizing See you again in 8 weeks

## 2023-02-16 ENCOUNTER — Encounter: Payer: Self-pay | Admitting: Family Medicine

## 2023-02-16 NOTE — Assessment & Plan Note (Signed)
 Patient did respond extremely well to the injection and almost has complete resolution of the pain.  Patient is going to continue to work on core strengthening.  Discussed icing regimen and home exercises.  Patient will follow-up with me again in 6 to 8 weeks

## 2023-02-23 ENCOUNTER — Ambulatory Visit: Payer: 59 | Admitting: Family Medicine

## 2023-03-03 ENCOUNTER — Other Ambulatory Visit (HOSPITAL_COMMUNITY): Payer: Self-pay

## 2023-03-06 DIAGNOSIS — L249 Irritant contact dermatitis, unspecified cause: Secondary | ICD-10-CM | POA: Diagnosis not present

## 2023-03-06 DIAGNOSIS — A630 Anogenital (venereal) warts: Secondary | ICD-10-CM | POA: Diagnosis not present

## 2023-03-06 DIAGNOSIS — L570 Actinic keratosis: Secondary | ICD-10-CM | POA: Diagnosis not present

## 2023-03-17 ENCOUNTER — Encounter: Payer: Self-pay | Admitting: Family Medicine

## 2023-03-17 ENCOUNTER — Other Ambulatory Visit: Payer: Self-pay

## 2023-03-17 MED ORDER — PREDNISONE 20 MG PO TABS: 40.0000 mg | ORAL_TABLET | Freq: Every day | ORAL | 0 refills | Status: DC

## 2023-03-20 ENCOUNTER — Other Ambulatory Visit: Payer: Self-pay | Admitting: Internal Medicine

## 2023-03-21 ENCOUNTER — Other Ambulatory Visit: Payer: Self-pay

## 2023-03-21 MED ORDER — MOUNJARO 12.5 MG/0.5ML ~~LOC~~ SOAJ
12.5000 mg | SUBCUTANEOUS | 1 refills | Status: DC
  Filled 2023-03-21: qty 2, 28d supply, fill #0
  Filled 2023-04-25 – 2023-05-01 (×2): qty 2, 28d supply, fill #1

## 2023-04-04 DIAGNOSIS — A630 Anogenital (venereal) warts: Secondary | ICD-10-CM | POA: Diagnosis not present

## 2023-04-04 DIAGNOSIS — L249 Irritant contact dermatitis, unspecified cause: Secondary | ICD-10-CM | POA: Diagnosis not present

## 2023-04-04 DIAGNOSIS — L570 Actinic keratosis: Secondary | ICD-10-CM | POA: Diagnosis not present
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
## 2023-04-13 ENCOUNTER — Other Ambulatory Visit: Payer: Self-pay

## 2023-04-13 ENCOUNTER — Ambulatory Visit: Payer: 59 | Admitting: Family Medicine

## 2023-04-13 VITALS — BP 110/78 | HR 77 | Ht 69.0 in | Wt 195.0 lb

## 2023-04-13 DIAGNOSIS — M9902 Segmental and somatic dysfunction of thoracic region: Secondary | ICD-10-CM | POA: Diagnosis not present

## 2023-04-13 DIAGNOSIS — M9904 Segmental and somatic dysfunction of sacral region: Secondary | ICD-10-CM | POA: Diagnosis not present

## 2023-04-13 DIAGNOSIS — M2550 Pain in unspecified joint: Secondary | ICD-10-CM | POA: Diagnosis not present

## 2023-04-13 DIAGNOSIS — M722 Plantar fascial fibromatosis: Secondary | ICD-10-CM | POA: Diagnosis not present

## 2023-04-13 DIAGNOSIS — M9903 Segmental and somatic dysfunction of lumbar region: Secondary | ICD-10-CM | POA: Diagnosis not present

## 2023-04-13 DIAGNOSIS — M51362 Other intervertebral disc degeneration, lumbar region with discogenic back pain and lower extremity pain: Secondary | ICD-10-CM | POA: Diagnosis not present

## 2023-04-13 DIAGNOSIS — M79672 Pain in left foot: Secondary | ICD-10-CM | POA: Diagnosis not present

## 2023-04-13 MED ORDER — KETOROLAC TROMETHAMINE 60 MG/2ML IM SOLN
60.0000 mg | Freq: Once | INTRAMUSCULAR | Status: AC
  Administered 2023-04-13: 60 mg via INTRAMUSCULAR

## 2023-04-13 MED ORDER — METHYLPREDNISOLONE ACETATE 80 MG/ML IJ SUSP
80.0000 mg | Freq: Once | INTRAMUSCULAR | Status: AC
  Administered 2023-04-13: 80 mg via INTRAMUSCULAR

## 2023-04-13 NOTE — Patient Instructions (Signed)
 Injections in backside Shockwave for your heel and achilles See me again in 6-8 weeks

## 2023-04-14 ENCOUNTER — Encounter: Payer: Self-pay | Admitting: Family Medicine

## 2023-04-14 NOTE — Assessment & Plan Note (Signed)
 Exacerbation of the plantar fasciitis as well as posterior tightness of the capsule.  Has had calcific changes previously as well as significant thickening of the plantar fascia and we do want to see if patient does respond well to shockwave.  Patient would like to try this and will schedule at his earliest convenience

## 2023-04-14 NOTE — Assessment & Plan Note (Signed)
 Known degenerative disc disease.  Discussed with patient icing regimen and home exercises.  Has had significant difficulty with different medications and everything is as well.  Patient has been trying other modalities which she is having varying degrees of success.  Does feel though that the osteopathic manipulation has been helpful.  Follow-up again in 2 months

## 2023-04-19 NOTE — Progress Notes (Addendum)
   I, Miquel Amen, CMA acting as a scribe for Garlan Juniper, MD.  Patricia [name] is a 53 y.o. female who presents to [facility] today for consultation for ECSWT on her L foot. Pt was previously seen by Dr. Felipe Horton on 04/13/23 for OMT.  Today, pt c/o irritation of her R plantar fascitis after doing a lot of walking at [employer]. Pt locates pain to the heel and achilles. Denies swelling. Also notes bone spur. Sx worse with first morning steps, closed heal shoe, ambulation. Denies n/t.   She notes right plantar fasciitis and left Achilles tendinitis.   Pertinent review of systems: No fevers or chills  Relevant historical information: Hyperlipidemia diabetes and paroxysmal atrial fibrillation.   Exam:  BP 102/70   Pulse 86   Ht 5\' 9"  (1.753 m)   Wt 193 lb (87.5 kg)   SpO2 98%   BMI 28.50 kg/m  General: Well Developed, well nourished, and in no acute distress.   MSK: Right foot: Normal appearing Tender palpation plantar fascia. Normal foot and ankle motion.  Left foot: Some swelling posterior calcaneus.  Tender palpation at the insertion of the Achilles tendon on the posterior calcaneus.  Lab and Radiology Results                  Extracorporeal Shockwave Therapy Note    Patient is being treated today with ECSWT. Informed consent was obtained and patient tolerated procedure well.   Therapy performed by Garlan Juniper  Condition treated: Right plantar fasciitis and rt Achilles tendinitis Treatment preset used: Variable Energy used: 120 mJ right plantar fasciitis and 60 mJ left Achilles tendinitis Frequency used: 15 Hz Number of pulses: 3000 total.  2000 right plantar fascia and 1000 left Achilles tendon Head Size: Medium Treatment #1 of #4   Assessment and Plan: 53 y.o. female with right plantar fasciitis and rt Achilles tendinitis.  Plan to start shockwave treatments today.  Today was more of a discussion and a trial of shockwave.   She tolerated quite well and we will schedule for more in the future.  No charge for today shockwave. Total encounter time 20 minutes including face-to-face time with the patient and, reviewing past medical record, and charting on the date of service.  Time-based billing excludes time perform shockwave.   PDMP not reviewed this encounter. No orders of the defined types were placed in this encounter.  No orders of the defined types were placed in this encounter.    Discussed warning signs or symptoms. Please see discharge instructions. Patient expresses understanding.   The above documentation has been reviewed and is accurate and complete Garlan Juniper, M.D.

## 2023-04-20 ENCOUNTER — Encounter: Payer: Self-pay | Admitting: Family Medicine

## 2023-04-20 ENCOUNTER — Ambulatory Visit: Admitting: Family Medicine

## 2023-04-20 VITALS — BP 102/70 | HR 86 | Ht 69.0 in | Wt 193.0 lb

## 2023-04-20 DIAGNOSIS — M722 Plantar fascial fibromatosis: Secondary | ICD-10-CM | POA: Diagnosis not present

## 2023-04-20 DIAGNOSIS — M7662 Achilles tendinitis, left leg: Secondary | ICD-10-CM | POA: Diagnosis not present

## 2023-04-25 ENCOUNTER — Other Ambulatory Visit: Payer: Self-pay

## 2023-05-01 ENCOUNTER — Telehealth: Payer: Self-pay | Admitting: Pharmacy Technician

## 2023-05-01 ENCOUNTER — Encounter: Payer: Self-pay | Admitting: Internal Medicine

## 2023-05-01 ENCOUNTER — Other Ambulatory Visit: Payer: Self-pay

## 2023-05-01 ENCOUNTER — Telehealth: Payer: Self-pay

## 2023-05-01 ENCOUNTER — Other Ambulatory Visit (HOSPITAL_COMMUNITY): Payer: Self-pay

## 2023-05-01 NOTE — Telephone Encounter (Signed)
 Pharmacy Patient Advocate Encounter   Received notification from CoverMyMeds that prior authorization for Mounjaro  12.5MG /0.5ML auto-injectors  is required/requested.   Insurance verification completed.   The patient is insured through [facility] .   Per test claim: PA required; PA submitted to above mentioned insurance via CoverMyMeds Key/confirmation #/EOC [facility] Status is pending

## 2023-05-01 NOTE — Telephone Encounter (Signed)
 noted

## 2023-05-01 NOTE — Telephone Encounter (Signed)
 PA for Monjaro is needed.

## 2023-05-02 NOTE — Telephone Encounter (Signed)
 noted

## 2023-05-02 NOTE — Telephone Encounter (Signed)
 Pharmacy Patient Advocate Encounter  Received notification from [facility] that Prior Authorization for MOUNJARO  12.5 MG has been DENIED.  Full denial letter will be uploaded to the media tab. See denial reason below.      PA #/Case ID/Reference #: [id number]

## 2023-05-02 NOTE — Telephone Encounter (Signed)
Pharmacy Patient Advocate Encounter  Received notification from [facility] that Prior Authorization for Patricia [name] has been DENIED.  Full denial letter will be uploaded to the media tab. See denial reason below.

## 2023-05-02 NOTE — Telephone Encounter (Signed)
 Please see encounter from 05/01/2023 and advise as PA has been denied twice. Thank you

## 2023-05-03 ENCOUNTER — Ambulatory Visit (INDEPENDENT_AMBULATORY_CARE_PROVIDER_SITE_OTHER): Payer: Self-pay | Admitting: Family Medicine

## 2023-05-03 DIAGNOSIS — M7661 Achilles tendinitis, right leg: Secondary | ICD-10-CM

## 2023-05-03 DIAGNOSIS — M722 Plantar fascial fibromatosis: Secondary | ICD-10-CM

## 2023-05-03 NOTE — Progress Notes (Signed)
   [facility] [address] [place] [address] Phone: [phone]   Extracorporeal Shockwave Therapy Note    Patient is being treated today with ECSWT. Informed consent was obtained and patient tolerated procedure well.   Therapy performed by Garlan Juniper  Condition treated: Right plantar fasciitis and right Achilles tendinitis Treatment preset used: Variable Energy used: 120 mJ right plantar fascia and 70 mJ right Achilles tendon Frequency used: 15 Hz Number of pulses: 4000 total 2000 each side Head Size: Medium Treatment #2 of #4  Electronically signed by:  [facility] 3:23 PM 05/03/23

## 2023-05-04 ENCOUNTER — Telehealth: Payer: Self-pay

## 2023-05-04 ENCOUNTER — Other Ambulatory Visit: Payer: Self-pay

## 2023-05-04 ENCOUNTER — Encounter: Payer: Self-pay | Admitting: Family Medicine

## 2023-05-04 MED ORDER — ESTRADIOL 0.0375 MG/24HR TD PTWK: 0.0375 mg | MEDICATED_PATCH | TRANSDERMAL | 1 refills | Status: DC

## 2023-05-04 MED ORDER — GABAPENTIN 100 MG PO CAPS: 200.0000 mg | ORAL_CAPSULE | Freq: Every day | ORAL | 0 refills | Status: DC

## 2023-05-04 MED ORDER — CELECOXIB 200 MG PO CAPS: 200.0000 mg | ORAL_CAPSULE | Freq: Two times a day (BID) | ORAL | 2 refills | Status: DC

## 2023-05-04 NOTE — Telephone Encounter (Signed)
 Pleas see pt message

## 2023-05-04 NOTE — Telephone Encounter (Signed)
 Pharmacy Patient Advocate Encounter   Received notification from Pt Calls Messages that prior authorization for Mounjaro  12.5MG /0.5ML auto-injectors is required/requested.   Insurance verification completed.   The patient is insured through [facility] .   Per test claim: PA required; PA submitted to above mentioned insurance via CoverMyMeds Key/confirmation #/EOC [facility] Status is pending

## 2023-05-04 NOTE — Telephone Encounter (Signed)
 Have resubmitted with recent chart notes, A1c labs, and proof that pt has trialed trulicity  and victoza . Fingers crossed!

## 2023-05-05 ENCOUNTER — Other Ambulatory Visit: Payer: Self-pay | Admitting: Internal Medicine

## 2023-05-05 NOTE — Telephone Encounter (Signed)
 Pharmacy Patient Advocate Encounter  Received notification from [facility] that Prior Authorization for Mounjaro  has been DENIED.  Full denial letter will be uploaded to the media tab. See denial reason below.   PA #/Case ID/Reference #: [id number]

## 2023-05-08 ENCOUNTER — Encounter: Payer: Self-pay | Admitting: Internal Medicine

## 2023-05-08 ENCOUNTER — Other Ambulatory Visit: Payer: Self-pay

## 2023-05-08 ENCOUNTER — Telehealth: Payer: Self-pay

## 2023-05-08 MED ORDER — LEVOCETIRIZINE DIHYDROCHLORIDE 5 MG PO TABS: 5.0000 mg | ORAL_TABLET | Freq: Every evening | ORAL | 1 refills | Status: DC

## 2023-05-08 MED ORDER — MONTELUKAST SODIUM 10 MG PO TABS: 10.0000 mg | ORAL_TABLET | Freq: Every day | ORAL | 1 refills | Status: DC

## 2023-05-08 MED ORDER — PROGESTERONE 200 MG PO CAPS: 200.0000 mg | ORAL_CAPSULE | Freq: Every day | ORAL | 1 refills | Status: DC

## 2023-05-08 NOTE — Telephone Encounter (Signed)
 Per the other message encounter from our prior authorization team one of the three medications that she has tried is not one of the three she has to trail first. She will need to try Ozempic  first unless there is reason she can not take ozempic .

## 2023-05-08 NOTE — Telephone Encounter (Signed)
 Copied from CRM [id number]. Topic: Clinical - Medication Question >> May 08, 2023  4:38 PM Chuck Crater wrote: Reason for CRM: Patient spoke with Raina Bunting denying prior auth again because of incorrect paperwork was sent in.They need proof that she has tried other medications on Formulary. Patient wants to know if she can get samples or any type of sample until all of this is sorted out. She states she is 2 weeks behind on starting.

## 2023-05-09 ENCOUNTER — Other Ambulatory Visit: Payer: Self-pay | Admitting: Internal Medicine

## 2023-05-09 MED ORDER — SEMAGLUTIDE (2 MG/DOSE) 8 MG/3ML ~~LOC~~ SOPN: 2.0000 mg | PEN_INJECTOR | SUBCUTANEOUS | 2 refills | Status: DC

## 2023-05-10 ENCOUNTER — Telehealth: Payer: Self-pay

## 2023-05-10 ENCOUNTER — Other Ambulatory Visit (HOSPITAL_COMMUNITY): Payer: Self-pay

## 2023-05-10 ENCOUNTER — Other Ambulatory Visit: Payer: Self-pay

## 2023-05-10 ENCOUNTER — Other Ambulatory Visit: Payer: Self-pay | Admitting: Internal Medicine

## 2023-05-10 ENCOUNTER — Other Ambulatory Visit (HOSPITAL_BASED_OUTPATIENT_CLINIC_OR_DEPARTMENT_OTHER): Payer: Self-pay

## 2023-05-10 MED ORDER — OZEMPIC (2 MG/DOSE) 8 MG/3ML ~~LOC~~ SOPN
2.0000 mg | PEN_INJECTOR | SUBCUTANEOUS | 2 refills | Status: DC
  Filled 2023-05-10 – 2023-07-21 (×3): qty 3, 28d supply, fill #0

## 2023-05-10 MED ORDER — SEMAGLUTIDE (2 MG/DOSE) 8 MG/3ML ~~LOC~~ SOPN
2.0000 mg | PEN_INJECTOR | SUBCUTANEOUS | 2 refills | Status: DC
  Filled 2023-05-10 – 2023-05-11 (×5): qty 3, 28d supply, fill #0
  Filled 2023-06-08: qty 3, 28d supply, fill #1

## 2023-05-10 NOTE — Telephone Encounter (Signed)
 Pt is aware.

## 2023-05-10 NOTE — Telephone Encounter (Signed)
 PA needed for Ozempic  2 mg dose. Per pt it has to be submitted today in order for it to be approved. Pt stated that she spoke with her insurance company this morning.

## 2023-05-10 NOTE — Telephone Encounter (Signed)
 Upon further review of recently received denial letter, it is stating patient must try at least three of the preferred products which include Rybelsus , Trulicity , and liraglutide . I see that brand name Victoza  has been tried, but has since gone generic, it's possible that they want pt to try generic Victoza , also I see no chart notes that pt has attempted Rybelsus , if pt has trialed previously, please addend chart notes to reflect this. My apologies for all of the confusion. Please let us  know how you would like us  to move forward.

## 2023-05-10 NOTE — Telephone Encounter (Signed)
 PA request has been Approved. New Encounter has been or will be created for follow up. For additional info see Pharmacy Prior Auth telephone encounter from 05/10/23.

## 2023-05-10 NOTE — Telephone Encounter (Signed)
 Pharmacy Patient Advocate Encounter  Received notification from [facility] that Prior Authorization for Ozempic  (0.25 or 0.5 MG/DOSE) 2MG /3ML pen-injectors has been APPROVED from 05/10/23/ to 05/09/24. Ran test claim, Copay is $24.99. This test claim was processed through [facility]- copay amounts may vary at other pharmacies due to pharmacy/plan contracts, or as the patient moves through the different stages of their insurance plan.   PA #/Case ID/Reference #: [id number]

## 2023-05-11 ENCOUNTER — Ambulatory Visit: Payer: Self-pay | Admitting: Family Medicine

## 2023-05-11 ENCOUNTER — Other Ambulatory Visit (HOSPITAL_BASED_OUTPATIENT_CLINIC_OR_DEPARTMENT_OTHER): Payer: Self-pay

## 2023-05-11 ENCOUNTER — Telehealth: Payer: Self-pay

## 2023-05-11 ENCOUNTER — Other Ambulatory Visit (HOSPITAL_COMMUNITY): Payer: Self-pay

## 2023-05-11 ENCOUNTER — Other Ambulatory Visit: Payer: Self-pay

## 2023-05-11 DIAGNOSIS — M7661 Achilles tendinitis, right leg: Secondary | ICD-10-CM

## 2023-05-11 DIAGNOSIS — M722 Plantar fascial fibromatosis: Secondary | ICD-10-CM

## 2023-05-11 NOTE — Telephone Encounter (Signed)
 Pt was approved for the Ozempic  2 mg. Please refuse this request

## 2023-05-11 NOTE — Patient Instructions (Signed)
 Happy to schedule more if needed.

## 2023-05-11 NOTE — Telephone Encounter (Signed)
 Copied from CRM [id number]. Topic: Clinical - Prescription Issue >> May 11, 2023 12:24 PM Marlan Silva wrote: Reason for CRM: Patient needs a new prior auth for her medication it was sent via fax buy the pharmacy, incase the doctor wants to do a verbal prior auth instead, the number is [phone]

## 2023-05-11 NOTE — Progress Notes (Signed)
   [facility] [address] [place] [address] Phone: [phone]   Extracorporeal Shockwave Therapy Note    Patient is being treated today with ECSWT. Informed consent was obtained and patient tolerated procedure well.   Therapy performed by Garlan Juniper  Condition treated: Right plantar fasciitis and right Achilles tendinitis Treatment preset used: Variable Energy used: 120 mJ right plantar fasciitis and 70 mJ right Achilles tendinitis Frequency used: 15 Hz Number of pulses: 4000 total.  2000 each site. Head Size: Medium Treatment #3 of #3-4  Electronically signed by:  [facility] 3:51 PM 05/11/23

## 2023-05-11 NOTE — Telephone Encounter (Signed)
 Pharmacy Patient Advocate Encounter   Received notification from CoverMyMeds that prior authorization for Ozempic  (2 MG/DOSE) 8 MG/3ML pen-injectors is required/requested.   Insurance verification completed.   The patient is insured through [facility] .   Per test claim: PA required; PA submitted to above mentioned insurance via CoverMyMeds Key/confirmation #/EOC [id number] Status is pending

## 2023-05-11 NOTE — Telephone Encounter (Signed)
 Pharmacy Patient Advocate Encounter  Received notification from [facility] that Prior Authorization for Ozempic  (2 MG/DOSE) 8 MG/3ML pen-injectors  has been APPROVED from 05/11/23 to 05/10/24

## 2023-05-13 ENCOUNTER — Other Ambulatory Visit: Payer: Self-pay | Admitting: Internal Medicine

## 2023-05-16 NOTE — Telephone Encounter (Signed)
 Pt has been made aware.

## 2023-05-17 NOTE — Telephone Encounter (Signed)
 Hey! Patient needs prior auth for what medication?

## 2023-05-18 ENCOUNTER — Ambulatory Visit: Payer: Self-pay

## 2023-05-18 NOTE — Telephone Encounter (Signed)
 Pt was offered other appts and stated that she only wants to see Dr. Tullo and it has to be after 1 pm. Pt was scheduled for first available with Dr. Madelon Scheuermann which is next week.

## 2023-05-18 NOTE — Telephone Encounter (Signed)
  Chief Complaint: left ear ache  Symptoms: referred pain to tonsils, and swallowing Frequency: 6 weeks but has worsened Pertinent Negatives: Patient denies discharge, fever. Cold sx Disposition: [] ED /[] Urgent Care (no appt availability in office) / [x] Appointment(In office/virtual)/ []  [facility]/ [] Home Care/ [] Refused Recommended Disposition /[] [facility]/ []  Follow-up with PCP Additional Notes: Pt only wants to see Dr Madelon Scheuermann. Wait listed appt scheduled for next Wednesday. Pt can come in after 1 pm only  Copied from CRM [id number]. Topic: Clinical - Red Word Triage >> May 18, 2023  8:44 AM Crispin Dolphin wrote: Red Word that prompted transfer to Nurse Triage: Worsening ear pain. Not getting better with allergies Reason for Disposition  Earache  (Exceptions: brief ear pain of < 60 minutes duration, earache occurring during air travel  Answer Assessment - Initial Assessment Questions 1. LOCATION: "Which ear is involved?"     Left ear  2. ONSET: "When did the ear start hurting"      6 weeks  3. SEVERITY: "How bad is the pain?"  (Scale 1-10; mild, moderate or severe)   - MILD (1-3): doesn't interfere with normal activities    - MODERATE (4-7): interferes with normal activities or awakens from sleep    - SEVERE (8-10): excruciating pain, unable to do any normal activities      3/10 4. URI SYMPTOMS: "Do you have a runny nose or cough?"     *No Answer* 5. FEVER: "Do you have a fever?" If Yes, ask: "What is your temperature, how was it measured, and when did it start?"     no  7. OTHER SYMPTOMS: "Do you have any other symptoms?" (e.g., headache, stiff neck, dizziness, vomiting, runny nose, decreased hearing)     Referred pain to throat. Itches and unconfortable  Protocols used: Louie Rover

## 2023-05-24 ENCOUNTER — Encounter: Payer: Self-pay | Admitting: Internal Medicine

## 2023-05-24 ENCOUNTER — Ambulatory Visit: Admitting: Internal Medicine

## 2023-05-24 VITALS — BP 107/70 | HR 87 | Temp 98.0°F | Ht 69.0 in | Wt 197.2 lb

## 2023-05-24 DIAGNOSIS — H6500 Acute serous otitis media, unspecified ear: Secondary | ICD-10-CM

## 2023-05-24 DIAGNOSIS — E785 Hyperlipidemia, unspecified: Secondary | ICD-10-CM | POA: Diagnosis not present

## 2023-05-24 DIAGNOSIS — Z1211 Encounter for screening for malignant neoplasm of colon: Secondary | ICD-10-CM

## 2023-05-24 DIAGNOSIS — F320 Major depressive disorder, single episode, mild: Secondary | ICD-10-CM

## 2023-05-24 DIAGNOSIS — E1159 Type 2 diabetes mellitus with other circulatory complications: Secondary | ICD-10-CM

## 2023-05-24 DIAGNOSIS — I152 Hypertension secondary to endocrine disorders: Secondary | ICD-10-CM

## 2023-05-24 DIAGNOSIS — E669 Obesity, unspecified: Secondary | ICD-10-CM | POA: Diagnosis not present

## 2023-05-24 DIAGNOSIS — Z7985 Long-term (current) use of injectable non-insulin antidiabetic drugs: Secondary | ICD-10-CM

## 2023-05-24 DIAGNOSIS — E1169 Type 2 diabetes mellitus with other specified complication: Secondary | ICD-10-CM | POA: Diagnosis not present

## 2023-05-24 DIAGNOSIS — M999 Biomechanical lesion, unspecified: Secondary | ICD-10-CM | POA: Diagnosis not present

## 2023-05-24 LAB — COMPREHENSIVE METABOLIC PANEL WITH GFR
ALT: 13 U/L (ref 0–35)
AST: 16 U/L (ref 0–37)
Albumin: 4.5 g/dL (ref 3.5–5.2)
Alkaline Phosphatase: 70 U/L (ref 39–117)
BUN: 14 mg/dL (ref 6–23)
CO2: 25 meq/L (ref 19–32)
Calcium: 9.4 mg/dL (ref 8.4–10.5)
Chloride: 101 meq/L (ref 96–112)
Creatinine, Ser: 0.72 mg/dL (ref 0.40–1.20)
GFR: 95.36 mL/min (ref >60.00)
Glucose, Bld: 89 mg/dL (ref 70–99)
Potassium: 4.1 meq/L (ref 3.5–5.1)
Sodium: 135 meq/L (ref 135–145)
Total Bilirubin: 0.6 mg/dL (ref 0.2–1.2)
Total Protein: 8.2 g/dL (ref 6.0–8.3)

## 2023-05-24 LAB — LIPID PANEL
Cholesterol: 246 mg/dL — ABNORMAL HIGH (ref 0–200)
HDL: 50.7 mg/dL (ref >39.00)
LDL Cholesterol: 166 mg/dL — ABNORMAL HIGH (ref 0–99)
NonHDL: 195.72
Total CHOL/HDL Ratio: 5
Triglycerides: 151 mg/dL — ABNORMAL HIGH (ref 0.0–149.0)
VLDL: 30.2 mg/dL (ref 0.0–40.0)

## 2023-05-24 LAB — MICROALBUMIN / CREATININE URINE RATIO
Creatinine,U: 11.9 mg/dL
Microalb Creat Ratio: UNDETERMINED mg/g (ref 0.0–30.0)
Microalb, Ur: <0.7 mg/dL

## 2023-05-24 LAB — HEMOGLOBIN A1C: Hgb A1c MFr Bld: 6.2 % (ref 4.6–6.5)

## 2023-05-24 MED ORDER — LOSARTAN POTASSIUM 50 MG PO TABS: 50.0000 mg | ORAL_TABLET | Freq: Every day | ORAL | 1 refills | Status: DC

## 2023-05-24 MED ORDER — AMOXICILLIN-POT CLAVULANATE 875-125 MG PO TABS
1.0000 | ORAL_TABLET | Freq: Two times a day (BID) | ORAL | 0 refills | Status: DC
Start: 2023-05-24 — End: 2023-08-24

## 2023-05-24 MED ORDER — BUPROPION HCL ER (XL) 150 MG PO TB24
150.0000 mg | ORAL_TABLET | Freq: Every day | ORAL | 2 refills | Status: DC
Start: 2023-05-24 — End: 2023-06-16

## 2023-05-24 MED ORDER — BUSPIRONE HCL 10 MG PO TABS
10.0000 mg | ORAL_TABLET | Freq: Two times a day (BID) | ORAL | 5 refills | Status: DC
Start: 2023-05-24 — End: 2023-08-24

## 2023-05-24 MED ORDER — MOMETASONE FUROATE 0.1 % EX CREA: TOPICAL_CREAM | CUTANEOUS | 1 refills | Status: AC

## 2023-05-24 NOTE — Patient Instructions (Addendum)
 I will try to appeal the Mounjaro  refusal with the help of our  pharmacy consultant  Trial of buspirone 10 mg twice daily and Wellbutrin  150 mg once daily .  Start the buspirone a few days ahead of the wellbutrin    Eardrum looks fine.  Try using the mometasone  first,  if ear pain persists,  add the augmentin 

## 2023-05-24 NOTE — Progress Notes (Unsigned)
 Subjective:  Patient ID: Patricia [name], female    DOB: Jul 05, 1970  Age: 53 y.o. MRN: [id number]  CC: There were no encounter diagnoses.   HPI Patricia [name] presents for  Chief Complaint  Patient presents with   Acute Visit    Left earache x 6 weeks Itchy   1) otitis externa/media  : symptoms present for the last 6 weeks:  pain and itching .  Started with allergies/pollen .  Has been taking singulair  claritin  and steroid nasal spray  2) positive depression screen:  perfect storm.  Sleeping too much,  gainign weight,    3) T2DM: now taking Ozempic  bc [employer] would not cover mounjaro .   She has been taking Ozempic  for 3 weeks  has more bloating than with Mounjaro  and thinks the weigh gain has made her depressed    Outpatient Medications Prior to Visit  Medication Sig Dispense Refill   acetaminophen  (TYLENOL ) 500 MG tablet Take 500 mg by mouth every 6 (six) hours as needed. Taking 3,000 mg daily     Berberine Chloride (BERBERINE HCI PO) Take by mouth.     Biotin  1000 MCG tablet Take 1,000 mcg by mouth daily.     butalbital -acetaminophen -caffeine  (FIORICET) 50-325-40 MG tablet Take 1 tablet by mouth every 6 (six) hours as needed. for pain 90 tablet 2   celecoxib  (CELEBREX ) 200 MG capsule TAKE 1 CAPSULE TWICE DAILY 180 capsule 1   Cholecalciferol (VITAMIN D ) 50 MCG (2000 UT) tablet Take 2,000 Units by mouth daily.     cyanocobalamin  (VITAMIN B12) 1000 MCG/ML injection Inject 1 mL (1,000 mcg total) into the muscle every 30 (thirty) days. 3 mL 3   estradiol  (CLIMARA  - DOSED IN MG/24 HR) 0.0375 mg/24hr patch Place 1 patch (0.0375 mg total) onto the skin once a week. 12 patch 1   gabapentin  (NEURONTIN ) 100 MG capsule Take 2 capsules (200 mg total) by mouth at bedtime. 180 capsule 0   levocetirizine (XYZAL ) 5 MG tablet Take 1 tablet (5 mg total) by mouth every evening. TAKE 1 TABLET BY MOUTH EVERY DAY IN THE EVENING 90 tablet 1   losartan  (COZAAR ) 100 MG tablet Take 1  tablet (100 mg total) by mouth daily. 90 tablet 1   methocarbamol  (ROBAXIN ) 750 MG tablet Take 1 tablet (750 mg total) by mouth every 6 (six) hours as needed for muscle spasms (MUSCLE SPASM). 120 tablet 2   montelukast  (SINGULAIR ) 10 MG tablet Take 1 tablet (10 mg total) by mouth at bedtime. 90 tablet 1   omeprazole  (PRILOSEC) 40 MG capsule Take 1 capsule (40 mg total) by mouth daily. 90 capsule 3   OVER THE COUNTER MEDICATION Take 1,500 mg by mouth daily. Calcium  Pyurbate     predniSONE  (DELTASONE ) 20 MG tablet Take 2 tablets (40 mg total) by mouth daily with breakfast. 10 tablet 0   progesterone  (PROMETRIUM ) 200 MG capsule Take 1 capsule (200 mg total) by mouth daily. TAKE 1 CAPSULE BY MOUTH EVERY DAY 90 capsule 1   Semaglutide , 2 MG/DOSE, (OZEMPIC , 2 MG/DOSE,) 8 MG/3ML SOPN Inject 2 mg into the skin once a week. 3 mL 2   Semaglutide , 2 MG/DOSE, 8 MG/3ML SOPN Inject 2 mg into the skin once a week. 3 mL 2   tirzepatide  (MOUNJARO ) 12.5 MG/0.5ML Pen Inject 12.5 mg into the skin once a week. 2 mL 1   triamcinolone  (NASACORT ) 55 MCG/ACT AERO nasal inhaler Place 2 sprays into the nose daily. 1 each 12   No facility-administered  medications prior to visit.    Review of Systems;  Patient denies headache, fevers, malaise, unintentional weight loss, skin rash, eye pain, sinus congestion and sinus pain, sore throat, dysphagia,  hemoptysis , cough, dyspnea, wheezing, chest pain, palpitations, orthopnea, edema, abdominal pain, nausea, melena, diarrhea, constipation, flank pain, dysuria, hematuria, urinary  Frequency, nocturia, numbness, tingling, seizures,  Focal weakness, Loss of consciousness,  Tremor, insomnia, depression, anxiety, and suicidal ideation.      Objective:  BP 118/84   Pulse 87   Temp 98 F (36.7 C)   Ht 5\' 9"  (1.753 m)   Wt 197 lb 3.2 oz (89.4 kg)   SpO2 99%   BMI 29.12 kg/m   BP Readings from Last 3 Encounters:  05/24/23 118/84  04/20/23 102/70  04/13/23 110/78    Wt  Readings from Last 3 Encounters:  05/24/23 197 lb 3.2 oz (89.4 kg)  04/20/23 193 lb (87.5 kg)  04/13/23 195 lb (88.5 kg)    Physical Exam  Lab Results  Component Value Date   HGBA1C 6.1 08/17/2022   HGBA1C 6.2 04/21/2022   HGBA1C 6.8 (H) 11/05/2021    Lab Results  Component Value Date   CREATININE 0.81 08/17/2022   CREATININE 0.77 04/21/2022   CREATININE 0.61 11/05/2021    Lab Results  Component Value Date   WBC 8.6 04/21/2022   HGB 12.4 04/21/2022   HCT 37.9 04/21/2022   PLT 324.0 04/21/2022   GLUCOSE 82 08/17/2022   CHOL 222 (H) 08/17/2022   TRIG 156.0 (H) 08/17/2022   HDL 40.40 08/17/2022   LDLDIRECT 181.0 08/17/2022   LDLCALC 151 (H) 08/17/2022   ALT 15 08/17/2022   AST 18 08/17/2022   NA 137 08/17/2022   K 3.9 08/17/2022   CL 102 08/17/2022   CREATININE 0.81 08/17/2022   BUN 11 08/17/2022   CO2 26 08/17/2022   TSH 0.86 04/21/2022   HGBA1C 6.1 08/17/2022   MICROALBUR 0.8 04/21/2022    MM 3D SCREENING MAMMOGRAM BILATERAL BREAST Result Date: 08/15/2022 CLINICAL DATA:  Screening. EXAM: DIGITAL SCREENING BILATERAL MAMMOGRAM WITH TOMOSYNTHESIS AND CAD TECHNIQUE: Bilateral screening digital craniocaudal and mediolateral oblique mammograms were obtained. Bilateral screening digital breast tomosynthesis was performed. The images were evaluated with computer-aided detection. COMPARISON:  Previous exam(s). ACR Breast Density Category c: The breasts are heterogeneously dense, which may obscure small masses. FINDINGS: There are no findings suspicious for malignancy. IMPRESSION: No mammographic evidence of malignancy. A result letter of this screening mammogram will be mailed directly to the patient. RECOMMENDATION: Screening mammogram in one year. (Code:SM-B-01Y) BI-RADS CATEGORY  1: Negative. Electronically Signed   By: Mercie Stalker M.D.   On: 08/15/2022 13:40    Assessment & Plan:  .There are no diagnoses linked to this encounter.   I spent 34 minutes on the day of  this face to face encounter reviewing patient's  most recent visit with cardiology,  nephrology,  and neurology,  prior relevant surgical and non surgical procedures, recent  labs and imaging studies, counseling on weight management,  reviewing the assessment and plan with patient, and post visit ordering and reviewing of  diagnostics and therapeutics with patient  .   Follow-up: No follow-ups on file.   Thersia Flax, MD

## 2023-05-25 DIAGNOSIS — F320 Major depressive disorder, single episode, mild: Secondary | ICD-10-CM | POA: Insufficient documentation

## 2023-05-25 NOTE — Assessment & Plan Note (Signed)
 We discussed Resuming wellbutrin  for managing her depression and buspirone to mitigate any irritability

## 2023-05-25 NOTE — Assessment & Plan Note (Signed)
 She lost 35 lbs with Wegovy ,  and continued to lose weight on  Mounjaro  but her insurance is no longer paying for it. Will petition insurance  . BP is well controlled on losartan  100 mg daily.   Lab Results  Component Value Date   HGBA1C 6.2 05/24/2023   Lab Results  Component Value Date   LABMICR Comment 01/07/2016   MICROALBUR <0.7 05/24/2023   MICROALBUR 0.8 04/21/2022

## 2023-05-25 NOTE — Assessment & Plan Note (Signed)
 Empiric antibiotics and steroid ointment for externa

## 2023-05-27 ENCOUNTER — Ambulatory Visit: Payer: Self-pay | Admitting: Internal Medicine

## 2023-05-27 NOTE — Assessment & Plan Note (Signed)
 Untreated due to statin myalgia.s  zetia trial recommended  Lab Results  Component Value Date   CHOL 246 (H) 05/24/2023   HDL 50.70 05/24/2023   LDLCALC 166 (H) 05/24/2023   LDLDIRECT 181.0 08/17/2022   TRIG 151.0 (H) 05/24/2023   CHOLHDL 5 05/24/2023
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
## 2023-05-30 ENCOUNTER — Other Ambulatory Visit (INDEPENDENT_AMBULATORY_CARE_PROVIDER_SITE_OTHER): Admitting: Pharmacist

## 2023-05-30 ENCOUNTER — Other Ambulatory Visit: Payer: Self-pay | Admitting: Internal Medicine

## 2023-05-30 DIAGNOSIS — E1159 Type 2 diabetes mellitus with other circulatory complications: Secondary | ICD-10-CM

## 2023-05-30 DIAGNOSIS — Z7985 Long-term (current) use of injectable non-insulin antidiabetic drugs: Secondary | ICD-10-CM

## 2023-05-30 DIAGNOSIS — E1169 Type 2 diabetes mellitus with other specified complication: Secondary | ICD-10-CM

## 2023-05-30 DIAGNOSIS — E785 Hyperlipidemia, unspecified: Secondary | ICD-10-CM

## 2023-05-30 MED ORDER — EZETIMIBE 10 MG PO TABS: 10.0000 mg | ORAL_TABLET | Freq: Every day | ORAL | 3 refills | Status: DC

## 2023-05-30 NOTE — Addendum Note (Signed)
 Addended by: Thersia Flax on: 05/30/2023 12:45 PM   Modules accepted: Orders

## 2023-05-30 NOTE — Progress Notes (Signed)
   05/31/2023 Name: Patricia [name] MRN: [id number] DOB: Jun 05, 1970  Subjective  Chief Complaint  Patient presents with   Diabetes   Obesity   Medication Access   Care Team: Primary Care Provider: Thersia Flax, MD  Reason for visit: ?  Patricia [name] is a 53 y.o. female who presents today for a telephone visit with the pharmacist due to medication access concerns regarding Mounjaro  which has been denied for coverage by her insurance. ?   HPI Current Medications: Ozempic  2.0 mg weekly  Medication Tried Previously: Mounjaro : Jan 2024 - April 2025 Trulicity /dulaglutide  11/2021 - Jan 2024 Unable to tolerate weekly doses above 1.5 mg due to significant and painful abdominal bloating. Insufficient glycemic reduction.  Victoza /liraglutide  2018 Significant adverse effects including nausea with instances of vomiting. Ozempic /semaglutide  06/2020 - 02/2021 Insufficient response to therapy Metfomrin XR severe diarrhea despite XR formulation    Mounjaro  started 01/31/22 which patient tolerated without concerns for well over 1 year, through April of this year. Has been unable to obtain Mounjaro  since April due to insurance formulary changes despite prior trial of all other GLP1 agents, as above.   Since transition to Ozempic , patient reports increase in hunger/cravings. She has been back on Ozempic  for 4 weeks with significant increase in bloating compared to that with Mounjaro  previously. Bloating has not improved despite continual use of Ozempic  >1 month.   During this period, patient also notes a 9 lb weight gain independent of her bloating. Weight down t o179 lb on Mounjaro . She feels that the weight gain or possible the overall side effects has further contributed to current depression.    Medication Access: ?  Prescription drug coverage: YES Payor: [employer] - [employer] [id number] Mounjaro  continuation of therapy DENIED Ozempic  prior auth APPROVED  from 05/11/23 to 05/10/24 with $20 copay  Objective    Assessment and Plan:   1. Diabetes, Type 2: Back on Ozempic  2.0 mg/week though with adverse effects not resolving despite consistent use for over 4 weeks. Significant and painful upper abdominal bloating. Notably, since transition off of Mounjaro  and onto Ozempic , has also seen steady climb in both weight and blood sugar which she feels has also contributed to worsening of her mental health/depressive symptoms in recent weeks.   Given patient has tried and failed to achieve adequate A1c-reduction on liragutide, dulaglutide , semaglutide  across several months/years, and has shown evidence of glycemic control on Mounjaro  previously, it is not reasonable to re-trial alternative GLP1RA agents at this time.  Stop Ozempic  2.0 mg  Resume Mounjaro  to regain glycemic control.    Future Appointments  Date Time Provider Department Center  05/31/2023  3:30 PM Isidro Margo, DO [facility] None  08/23/2023  4:00 PM Thersia Flax, MD [facility]   Daron Ellen, PharmD Clinical Pharmacist [facility] [phone]

## 2023-05-31 ENCOUNTER — Ambulatory Visit: Admitting: Family Medicine

## 2023-05-31 VITALS — BP 104/74 | HR 87 | Ht 69.0 in

## 2023-05-31 DIAGNOSIS — M9903 Segmental and somatic dysfunction of lumbar region: Secondary | ICD-10-CM

## 2023-05-31 DIAGNOSIS — M9902 Segmental and somatic dysfunction of thoracic region: Secondary | ICD-10-CM

## 2023-05-31 DIAGNOSIS — M51362 Other intervertebral disc degeneration, lumbar region with discogenic back pain and lower extremity pain: Secondary | ICD-10-CM | POA: Diagnosis not present

## 2023-05-31 DIAGNOSIS — M9904 Segmental and somatic dysfunction of sacral region: Secondary | ICD-10-CM

## 2023-06-01 ENCOUNTER — Encounter: Payer: Self-pay | Admitting: Family Medicine

## 2023-06-01 MED ORDER — DIAZEPAM 5 MG PO TABS: 5.0000 mg | ORAL_TABLET | Freq: Three times a day (TID) | ORAL | 3 refills | Status: AC | PRN

## 2023-06-01 NOTE — Assessment & Plan Note (Signed)
 Discussed icing regimen of home exercises, which activities to do.  Patient is going to continue to work on core strengthening.  Has had difficulties reading serotonin syndrome which does make it difficult for different treatment options.  Continues to do well with her core strength.  Follow-up again in 6 to 8 weeks otherwise

## 2023-06-01 NOTE — Addendum Note (Signed)
 Addended by: Thersia Flax on: 06/01/2023 01:52 PM   Modules accepted: Orders

## 2023-06-08 ENCOUNTER — Encounter: Payer: Self-pay | Admitting: Internal Medicine

## 2023-06-08 ENCOUNTER — Encounter: Payer: Self-pay | Admitting: Pharmacist

## 2023-06-08 NOTE — Progress Notes (Signed)
 Prior Authorization:   Medication: Mounjaro  Status: Denied due to prior denial April 2025.   Appeal initiated 05/30/23. Appeal letter sent to plan 06/08/23.  Daron Ellen, PharmD Clinical Pharmacist

## 2023-06-12 ENCOUNTER — Telehealth: Payer: Self-pay

## 2023-06-12 NOTE — Telephone Encounter (Signed)
 PLEASE BE ADVISED.

## 2023-06-12 NOTE — Telephone Encounter (Signed)
 Copied from CRM [id number]. Topic: Referral - Status >> Jun 12, 2023  8:47 AM Lizabeth Riggs wrote: Reason for CRM:  Please resend referral to [facility] GASTRO DEPT P: [phone]: [phone] Release ID # [id number]. The Gastro Dept said they did not received the referral. Thanks

## 2023-06-12 NOTE — Telephone Encounter (Signed)
 I let pt know via mychart message.

## 2023-06-13 NOTE — Telephone Encounter (Signed)
 noted

## 2023-06-15 ENCOUNTER — Other Ambulatory Visit: Payer: Self-pay | Admitting: Internal Medicine

## 2023-07-05 ENCOUNTER — Other Ambulatory Visit: Payer: Self-pay

## 2023-07-08 ENCOUNTER — Other Ambulatory Visit: Payer: Self-pay | Admitting: Internal Medicine

## 2023-07-08 MED ORDER — NITROFURANTOIN MONOHYD MACRO 100 MG PO CAPS: 100.0000 mg | ORAL_CAPSULE | Freq: Two times a day (BID) | ORAL | 0 refills | Status: AC

## 2023-07-18 ENCOUNTER — Other Ambulatory Visit: Payer: Self-pay

## 2023-07-21 ENCOUNTER — Other Ambulatory Visit: Payer: Self-pay

## 2023-07-24 ENCOUNTER — Encounter: Payer: Self-pay | Admitting: Internal Medicine
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
## 2023-07-24 NOTE — Telephone Encounter (Signed)
 Moujaro is not in the pt's medication list. Is it okay to refill?

## 2023-07-25 ENCOUNTER — Other Ambulatory Visit: Payer: Self-pay

## 2023-07-25 MED ORDER — TIRZEPATIDE 12.5 MG/0.5ML ~~LOC~~ SOAJ
12.5000 mg | SUBCUTANEOUS | 2 refills | Status: DC
  Filled 2023-07-25: qty 2, 28d supply, fill #0
  Filled 2023-07-25: qty 6, 84d supply, fill #0
  Filled 2023-07-26 – 2023-08-02 (×3): qty 2, 28d supply, fill #0
  Filled 2023-08-23 (×2): qty 2, 28d supply, fill #1

## 2023-07-26 ENCOUNTER — Other Ambulatory Visit: Payer: Self-pay

## 2023-07-27 ENCOUNTER — Ambulatory Visit: Admitting: Family Medicine

## 2023-08-01 ENCOUNTER — Other Ambulatory Visit (HOSPITAL_COMMUNITY): Payer: Self-pay

## 2023-08-02 ENCOUNTER — Other Ambulatory Visit: Payer: Self-pay
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
## 2023-08-03 ENCOUNTER — Encounter: Payer: Self-pay | Admitting: Family Medicine

## 2023-08-03 ENCOUNTER — Ambulatory Visit: Admitting: Family Medicine

## 2023-08-03 VITALS — BP 138/92 | HR 85 | Ht 69.0 in | Wt 203.0 lb

## 2023-08-03 DIAGNOSIS — M9904 Segmental and somatic dysfunction of sacral region: Secondary | ICD-10-CM

## 2023-08-03 DIAGNOSIS — M9903 Segmental and somatic dysfunction of lumbar region: Secondary | ICD-10-CM

## 2023-08-03 DIAGNOSIS — M9902 Segmental and somatic dysfunction of thoracic region: Secondary | ICD-10-CM

## 2023-08-03 DIAGNOSIS — M5412 Radiculopathy, cervical region: Secondary | ICD-10-CM | POA: Diagnosis not present

## 2023-08-03 DIAGNOSIS — M9901 Segmental and somatic dysfunction of cervical region: Secondary | ICD-10-CM

## 2023-08-03 DIAGNOSIS — J189 Pneumonia, unspecified organism: Secondary | ICD-10-CM

## 2023-08-03 DIAGNOSIS — M9908 Segmental and somatic dysfunction of rib cage: Secondary | ICD-10-CM | POA: Diagnosis not present

## 2023-08-03 MED ORDER — DOXYCYCLINE HYCLATE 100 MG PO TABS: 100.0000 mg | ORAL_TABLET | Freq: Two times a day (BID) | ORAL | 0 refills | Status: AC

## 2023-08-03 MED ORDER — ALBUTEROL SULFATE HFA 108 (90 BASE) MCG/ACT IN AERS
2.0000 | INHALATION_SPRAY | RESPIRATORY_TRACT | 6 refills | Status: AC | PRN
Start: 2023-08-03 — End: ?

## 2023-08-03 MED ORDER — PREDNISONE 20 MG PO TABS: 40.0000 mg | ORAL_TABLET | Freq: Every day | ORAL | 0 refills | Status: DC

## 2023-08-03 NOTE — Assessment & Plan Note (Signed)
 Stable but could be giving some of the right shoulder pain.  Do think the cough is more likely with patient having more of the right sided decrease in breath sounds and rhonchi.  Will monitor.  Past medical history significant for fusion of cervical spine.

## 2023-08-03 NOTE — Patient Instructions (Signed)
 Good to see you and enjoy the beach Refilled albuterol  Prednisone  40 for 5 days Doxy 100  mg twice a day for 7 days See you in September

## 2023-08-03 NOTE — Assessment & Plan Note (Signed)
 Question pneumonia versus bronchitis.  Discussed potential x-rays.  Will treat secondary to the severity of the symptoms and physical exam.  Doxycycline , prednisone , and new inhaler of albuterol  given.  Pulse ox is normal at this time.  Patient knows worsening symptoms to seek medical attention.  Follow-up with me again in 6 weeks

## 2023-08-08 ENCOUNTER — Other Ambulatory Visit (HOSPITAL_COMMUNITY): Payer: Self-pay

## 2023-08-15 ENCOUNTER — Encounter: Payer: Self-pay | Admitting: Internal Medicine

## 2023-08-23 ENCOUNTER — Encounter: Admitting: Internal Medicine

## 2023-08-23 ENCOUNTER — Other Ambulatory Visit: Payer: Self-pay

## 2023-08-24 ENCOUNTER — Encounter: Payer: Self-pay | Admitting: Internal Medicine

## 2023-08-24 ENCOUNTER — Ambulatory Visit (INDEPENDENT_AMBULATORY_CARE_PROVIDER_SITE_OTHER): Admitting: Internal Medicine

## 2023-08-24 ENCOUNTER — Other Ambulatory Visit (HOSPITAL_COMMUNITY)
Admission: RE | Admit: 2023-08-24 | Discharge: 2023-08-24 | Disposition: A | Discharge status: Home / Self Care | Source: Ambulatory Visit | Attending: Internal Medicine | Admitting: Internal Medicine

## 2023-08-24 ENCOUNTER — Telehealth: Payer: Self-pay

## 2023-08-24 VITALS — BP 110/84 | HR 93 | Ht 69.0 in | Wt 194.8 lb

## 2023-08-24 DIAGNOSIS — E669 Obesity, unspecified: Secondary | ICD-10-CM

## 2023-08-24 DIAGNOSIS — E1169 Type 2 diabetes mellitus with other specified complication: Secondary | ICD-10-CM

## 2023-08-24 DIAGNOSIS — Z1211 Encounter for screening for malignant neoplasm of colon: Secondary | ICD-10-CM

## 2023-08-24 DIAGNOSIS — F331 Major depressive disorder, recurrent, moderate: Secondary | ICD-10-CM

## 2023-08-24 DIAGNOSIS — Z7985 Long-term (current) use of injectable non-insulin antidiabetic drugs: Secondary | ICD-10-CM

## 2023-08-24 DIAGNOSIS — R87811 Vaginal high risk human papillomavirus (HPV) DNA test positive: Secondary | ICD-10-CM

## 2023-08-24 DIAGNOSIS — M791 Myalgia, unspecified site: Secondary | ICD-10-CM

## 2023-08-24 DIAGNOSIS — E1159 Type 2 diabetes mellitus with other circulatory complications: Secondary | ICD-10-CM

## 2023-08-24 DIAGNOSIS — Z124 Encounter for screening for malignant neoplasm of cervix: Secondary | ICD-10-CM | POA: Insufficient documentation

## 2023-08-24 DIAGNOSIS — E785 Hyperlipidemia, unspecified: Secondary | ICD-10-CM

## 2023-08-24 DIAGNOSIS — I480 Paroxysmal atrial fibrillation: Secondary | ICD-10-CM

## 2023-08-24 DIAGNOSIS — Z0000 Encounter for general adult medical examination without abnormal findings: Secondary | ICD-10-CM

## 2023-08-24 DIAGNOSIS — I152 Hypertension secondary to endocrine disorders: Secondary | ICD-10-CM

## 2023-08-24 DIAGNOSIS — T466X5A Adverse effect of antihyperlipidemic and antiarteriosclerotic drugs, initial encounter: Secondary | ICD-10-CM

## 2023-08-24 DIAGNOSIS — Z1231 Encounter for screening mammogram for malignant neoplasm of breast: Secondary | ICD-10-CM

## 2023-08-24 MED ORDER — OMEPRAZOLE 40 MG PO CPDR: 40.0000 mg | DELAYED_RELEASE_CAPSULE | Freq: Every day | ORAL | 3 refills | Status: DC

## 2023-08-24 MED ORDER — CYANOCOBALAMIN 1000 MCG/ML IJ SOLN: 1000.0000 ug | INTRAMUSCULAR | 3 refills | Status: DC

## 2023-08-24 MED ORDER — LOSARTAN POTASSIUM 50 MG PO TABS: 50.0000 mg | ORAL_TABLET | Freq: Every day | ORAL | 1 refills | Status: DC

## 2023-08-24 MED ORDER — TIRZEPATIDE 12.5 MG/0.5ML ~~LOC~~ SOAJ
12.5000 mg | SUBCUTANEOUS | 2 refills | Status: DC
Start: 2023-08-24 — End: 2023-08-24

## 2023-08-24 MED ORDER — LEVOCETIRIZINE DIHYDROCHLORIDE 5 MG PO TABS: 5.0000 mg | ORAL_TABLET | Freq: Every evening | ORAL | 1 refills | Status: DC

## 2023-08-24 MED ORDER — PROGESTERONE 200 MG PO CAPS: 200.0000 mg | ORAL_CAPSULE | Freq: Every day | ORAL | 1 refills | Status: DC

## 2023-08-24 MED ORDER — ESTRADIOL 0.0375 MG/24HR TD PTWK: 0.0375 mg | MEDICATED_PATCH | TRANSDERMAL | 1 refills | Status: DC

## 2023-08-24 MED ORDER — LEVOFLOXACIN 500 MG PO TABS: 500.0000 mg | ORAL_TABLET | Freq: Every day | ORAL | 0 refills | Status: AC

## 2023-08-24 MED ORDER — ARIPIPRAZOLE 2 MG PO TABS: 2.0000 mg | ORAL_TABLET | Freq: Every day | ORAL | 2 refills | Status: AC

## 2023-08-24 MED ORDER — MONTELUKAST SODIUM 10 MG PO TABS: 10.0000 mg | ORAL_TABLET | Freq: Every day | ORAL | 1 refills | Status: DC

## 2023-08-24 MED ORDER — TIRZEPATIDE 12.5 MG/0.5ML ~~LOC~~ SOAJ
12.5000 mg | SUBCUTANEOUS | 2 refills | Status: DC
  Filled 2023-08-28: qty 2, 28d supply, fill #0

## 2023-08-24 MED ORDER — TRIAMCINOLONE ACETONIDE 55 MCG/ACT NA AERO: 2.0000 | INHALATION_SPRAY | Freq: Every day | NASAL | 12 refills | Status: AC

## 2023-08-24 MED ORDER — NITROFURANTOIN MONOHYD MACRO 100 MG PO CAPS
100.0000 mg | ORAL_CAPSULE | Freq: Two times a day (BID) | ORAL | 0 refills | Status: AC
Start: 2023-08-24 — End: ?

## 2023-08-24 MED ORDER — ZOLPIDEM TARTRATE 10 MG PO TABS: 10.0000 mg | ORAL_TABLET | Freq: Every evening | ORAL | 1 refills | Status: AC | PRN

## 2023-08-24 MED ORDER — PREDNISONE 10 MG PO TABS: ORAL_TABLET | ORAL | 0 refills | Status: AC

## 2023-08-24 MED ORDER — TRIAMCINOLONE ACETONIDE 55 MCG/ACT NA AERO: 2.0000 | INHALATION_SPRAY | Freq: Every day | NASAL | 12 refills | Status: DC

## 2023-08-24 MED ORDER — SCOPOLAMINE 1 MG/3DAYS TD PT72: 1.0000 | MEDICATED_PATCH | TRANSDERMAL | 0 refills | Status: AC

## 2023-08-24 MED ORDER — ARIPIPRAZOLE 2 MG PO TABS: 2.0000 mg | ORAL_TABLET | Freq: Every day | ORAL | 2 refills | Status: DC

## 2023-08-24 NOTE — Telephone Encounter (Signed)
 Pt is aware.

## 2023-08-24 NOTE — Telephone Encounter (Signed)
 Patient states at checkout that we need to [facility] [address]  Patient states Levaquin  and prednisone  Taper [facility] [address], but the rest are mail order.

## 2023-08-24 NOTE — Assessment & Plan Note (Signed)
 PAP smear of vaginal cuff was done  in 2023 and was HPV positive,  high risk repeat needed. . She uses prometrium 

## 2023-08-24 NOTE — Telephone Encounter (Signed)
 All prescriptions have been sent to correct pharmacy.

## 2023-08-24 NOTE — Progress Notes (Signed)
 Patient ID: Patricia [name], female    DOB: 10-27-1970  Age: 53 y.o. MRN: [id number]  The patient is here for annual preventive examination and management of other chronic and acute problems.   The risk factors are reflected in the social history.   The roster of all physicians providing medical care to patient - is listed in the Snapshot section of the chart.   Activities of daily living:  The patient is 100% independent in all ADLs: dressing, toileting, feeding as well as independent mobility   Home safety : The patient has smoke detectors in the home. They wear seatbelts.  There are no unsecured firearms at home. There is no violence in the home.    There is no risks for hepatitis, STDs or HIV. There is no   history of blood transfusion. They have no travel history to infectious disease endemic areas of the world.   The patient has seen their dentist in the last six month. They have seen their eye doctor in the last year. The patinet  denies slight hearing difficulty with regard to whispered voices and some television programs.  They have deferred audiologic testing in the last year.  They do not  have excessive sun exposure. Discussed the need for sun protection: hats, long sleeves and use of sunscreen if there is significant sun exposure.    Diet: the importance of a healthy diet is discussed. They do have a healthy diet.   The benefits of regular aerobic exercise were discussed. The patient  exercises  3 to 5 days per week  for  60 minutes.    Depression screen: there are no signs or vegative symptoms of depression- irritability, change in appetite, anhedonia, sadness/tearfullness.   The following portions of the patient's history were reviewed and updated as appropriate: allergies, current medications, past family history, past medical history,  past surgical history, past social history  and problem list.   Visual acuity was not assessed per patient preference since the patient  has regular follow up with an  ophthalmologist. Hearing and body mass index were assessed and reviewed.    During the course of the visit the patient was educated and counseled about appropriate screening and preventive services including : fall prevention , diabetes screening, nutrition counseling, colorectal cancer screening, and recommended immunizations.    Chief Complaint:   MDD/GAD: she has been taking 150 mg  wellbutrin  for several months but reports that it is not working anymore, and the addition of buspirone  has not helped.  She is requesting change in therapy with addition of Abilify  which she has tolerated in the past  Type 2 DM:   She  feels generally well,  But is not  exercising regularly due to multiple orthopedic issues.  Checking  blood sugars less than once daily at variable times, usually only if she feels she may be having a hypoglycemic event. .  BS have been under 130 fasting and < 150 post prandially.  Denies any recent hypoglyemic events.  Taking   medications as directed. Following a carbohydrate modified diet 6 days per week. Denies numbness, burning and tingling of extremities. Appetite is diminished with use of   Mounjaro .   Review of Symptoms  Patient denies headache, fevers, malaise, unintentional weight loss, skin rash, eye pain, sinus congestion and sinus pain, sore throat, dysphagia,  hemoptysis , cough, dyspnea, wheezing, chest pain, palpitations, orthopnea, edema, abdominal pain, nausea, melena, diarrhea, constipation, flank pain, dysuria, hematuria, urinary  Frequency, nocturia,  numbness, tingling, seizures,  Focal weakness, Loss of consciousness,  Tremor, insomnia, depression, anxiety, and suicidal ideation.    Physical Exam:  BP 110/84   Pulse 93   Ht 5' 9 (1.753 m)   Wt 194 lb 12.8 oz (88.4 kg)   SpO2 98%   BMI 28.77 kg/m    Physical Exam Vitals reviewed.  Constitutional:      General: She is not in acute distress.    Appearance: Normal  appearance. She is well-developed and normal weight. She is not ill-appearing, toxic-appearing or diaphoretic.  HENT:     Head: Normocephalic.     Right Ear: Tympanic membrane, ear canal and external ear normal. There is no impacted cerumen.     Left Ear: Tympanic membrane, ear canal and external ear normal. There is no impacted cerumen.     Nose: Nose normal.     Mouth/Throat:     Mouth: Mucous membranes are moist.     Pharynx: Oropharynx is clear.  Eyes:     General: No scleral icterus.       Right eye: No discharge.        Left eye: No discharge.     Conjunctiva/sclera: Conjunctivae normal.     Pupils: Pupils are equal, round, and reactive to light.  Neck:     Thyroid : No thyromegaly.     Vascular: No carotid bruit or JVD.  Cardiovascular:     Rate and Rhythm: Normal rate and regular rhythm.     Heart sounds: Normal heart sounds.  Pulmonary:     Effort: Pulmonary effort is normal. No respiratory distress.     Breath sounds: Normal breath sounds.  Chest:  Breasts:    Breasts are symmetrical.     Right: Normal. No swelling, inverted nipple, mass, nipple discharge, skin change or tenderness.     Left: Normal. No swelling, inverted nipple, mass, nipple discharge, skin change or tenderness.  Abdominal:     General: Bowel sounds are normal.     Palpations: Abdomen is soft. There is no mass.     Tenderness: There is no abdominal tenderness. There is no guarding or rebound.     Hernia: There is no hernia in the left inguinal area or right inguinal area.  Genitourinary:    General: Normal vulva.     Exam position: Lithotomy position.     Pubic Area: No rash or pubic lice.      Labia:        Right: No rash, tenderness, lesion or injury.        Left: No rash, tenderness, lesion or injury.      Vagina: Normal. No lesions.     Uterus: Absent.      Adnexa: Right adnexa normal and left adnexa normal.  Musculoskeletal:        General: Normal range of motion.     Cervical back: Normal  range of motion and neck supple.  Lymphadenopathy:     Cervical: No cervical adenopathy.     Upper Body:     Right upper body: No supraclavicular, axillary or pectoral adenopathy.     Left upper body: No supraclavicular, axillary or pectoral adenopathy.     Lower Body: No right inguinal adenopathy. No left inguinal adenopathy.  Skin:    General: Skin is warm and dry.  Neurological:     General: No focal deficit present.     Mental Status: She is alert and oriented to person, place, and time. Mental status  is at baseline.  Psychiatric:        Mood and Affect: Mood normal.        Behavior: Behavior normal.        Thought Content: Thought content normal.        Judgment: Judgment normal.     Assessment and Plan: Encounter for screening mammogram for malignant neoplasm of breast -     3D Screening Mammogram, Left and Right; Future  Obesity, diabetes, and hypertension syndrome (HCC) Assessment & Plan: Her diabetes is under excellent control  and she is continuing to lose weight on Mounjaro  . BP is well controlled on losartan  50 mg daily.   Lab Results  Component Value Date   HGBA1C 6.3 08/24/2023   Lab Results  Component Value Date   LABMICR Comment 01/07/2016   MICROALBUR <0.7 05/24/2023       Orders: -     Comprehensive metabolic panel with GFR -     Hemoglobin A1c  Hyperlipidemia associated with type 2 diabetes mellitus (HCC) Assessment & Plan: LDL has improved but is not at gaol due to being untreated secondary to statin myalgia  zetia  trial recommended  Lab Results  Component Value Date   CHOL 209 (H) 08/24/2023   HDL 41.40 08/24/2023   LDLCALC 131 (H) 08/24/2023   LDLDIRECT 147.0 08/24/2023   TRIG 182.0 (H) 08/24/2023   CHOLHDL 5 08/24/2023     Orders: -     Lipid panel -     LDL cholesterol, direct -     Tirzepatide ; Inject 12.5 mg into the skin once a week.  Dispense: 6 mL; Refill: 2  Colon cancer screening  Visit for preventive health  examination Assessment & Plan: age appropriate education and counseling updated, referrals for preventative services and immunizations addressed, dietary and smoking counseling addressed, most recent labs reviewed.  I have personally reviewed and have noted:   1) the patient's medical and social history 2) The pt's use of alcohol, tobacco, and illicit drugs 3) The patient's current medications and supplements 4) Functional ability including ADL's, fall risk, home safety risk, hearing and visual impairment 5) Diet and physical activities 6) Evidence for depression or mood disorder 7) The patient's height, weight, and BMI have been recorded in the chart  I have made referrals, and provided counseling and education based on review of the above    Cervical cancer screening -     Cytology - PAP  Screening for cervical cancer Assessment & Plan: PAP smear of vaginal cuff was done  in 2023 and was HPV positive,  high risk repeat needed. . She uses prometrium     Vaginal high risk HPV DNA test positive Assessment & Plan: PAP smear of vaginal cuff  was repeated today .  Exam was normal   Moderate episode of recurrent major depressive disorder (HCC) Assessment & Plan: Aggravated by chronic pain .  Reduce wellbutrin  to 150 mg daily and adding abilify  2 mg    Paroxysmal atrial fibrillation [facility]) Assessment & Plan: Post ablation on February 17, 2020.  Doing well and maintaining sinus rhythm.  continue aspirin 81 mg by mouth daily for stroke prophylaxis given her CHA2DS2-VASc score of 2.  continue metoprolol     Myalgia due to statin Assessment & Plan: Trial of zetia  recommended    Other orders -     Zolpidem  Tartrate; Take 1 tablet (10 mg total) by mouth at bedtime as needed for sleep.  Dispense: 15 tablet; Refill: 1 -  Nitrofurantoin  Monohyd Macro; Take 1 capsule (100 mg total) by mouth 2 (two) times daily.  Dispense: 14 capsule; Refill: 0 -     predniSONE ; 6 tablets on Day 1 , then  reduce by 1 tablet daily until gone  Dispense: 21 tablet; Refill: 0 -     levoFLOXacin ; Take 1 tablet (500 mg total) by mouth daily for 7 days.  Dispense: 7 tablet; Refill: 0 -     Scopolamine ; Place 1 patch (1.5 mg total) onto the skin every 3 (three) days.  Dispense: 4 patch; Refill: 0 -     ARIPiprazole ; Take 1 tablet (2 mg total) by mouth daily.  Dispense: 30 tablet; Refill: 2 -     Cyanocobalamin ; Inject 1 mL (1,000 mcg total) into the muscle every 30 (thirty) days.  Dispense: 3 mL; Refill: 3 -     Estradiol ; Place 1 patch (0.0375 mg total) onto the skin once a week.  Dispense: 12 patch; Refill: 1 -     Levocetirizine Dihydrochloride ; Take 1 tablet (5 mg total) by mouth every evening. TAKE 1 TABLET BY MOUTH EVERY DAY IN THE EVENING  Dispense: 90 tablet; Refill: 1 -     Losartan  Potassium; Take 1 tablet (50 mg total) by mouth daily.  Dispense: 90 tablet; Refill: 1 -     Montelukast  Sodium; Take 1 tablet (10 mg total) by mouth at bedtime.  Dispense: 90 tablet; Refill: 1 -     Omeprazole ; Take 1 capsule (40 mg total) by mouth daily.  Dispense: 90 capsule; Refill: 3 -     Progesterone ; Take 1 capsule (200 mg total) by mouth daily. TAKE 1 CAPSULE BY MOUTH EVERY DAY  Dispense: 90 capsule; Refill: 1 -     Triamcinolone  Acetonide; Place 2 sprays into the nose daily.  Dispense: 1 each; Refill: 12    No follow-ups on file.  Verneita LITTIE Kettering, MD

## 2023-08-24 NOTE — Patient Instructions (Signed)
 For your cruise,, you can take the following medications  with you   Levaquin  for bronchitis/sinusitis/pneumonia PLUS prednisone  TAPER  for cough  ABILIFY  TRIAL

## 2023-08-25 ENCOUNTER — Other Ambulatory Visit: Payer: Self-pay | Admitting: Internal Medicine

## 2023-08-25 LAB — COMPREHENSIVE METABOLIC PANEL WITH GFR
ALT: 13 U/L (ref 0–35)
AST: 15 U/L (ref 0–37)
Albumin: 4.2 g/dL (ref 3.5–5.2)
Alkaline Phosphatase: 52 U/L (ref 39–117)
BUN: 10 mg/dL (ref 6–23)
CO2: 28 meq/L (ref 19–32)
Calcium: 9.1 mg/dL (ref 8.4–10.5)
Chloride: 101 meq/L (ref 96–112)
Creatinine, Ser: 0.84 mg/dL (ref 0.40–1.20)
GFR: 79.12 mL/min (ref >60.00)
Glucose, Bld: 91 mg/dL (ref 70–99)
Potassium: 4 meq/L (ref 3.5–5.1)
Sodium: 139 meq/L (ref 135–145)
Total Bilirubin: 0.6 mg/dL (ref 0.2–1.2)
Total Protein: 7.1 g/dL (ref 6.0–8.3)

## 2023-08-25 LAB — LDL CHOLESTEROL, DIRECT: Direct LDL: 147 mg/dL

## 2023-08-25 LAB — LIPID PANEL
Cholesterol: 209 mg/dL — ABNORMAL HIGH (ref 0–200)
HDL: 41.4 mg/dL (ref >39.00)
LDL Cholesterol: 131 mg/dL — ABNORMAL HIGH (ref 0–99)
NonHDL: 167.22
Total CHOL/HDL Ratio: 5
Triglycerides: 182 mg/dL — ABNORMAL HIGH (ref 0.0–149.0)
VLDL: 36.4 mg/dL (ref 0.0–40.0)

## 2023-08-25 LAB — HEMOGLOBIN A1C: Hgb A1c MFr Bld: 6.3 % (ref 4.6–6.5)

## 2023-08-26 ENCOUNTER — Ambulatory Visit: Payer: Self-pay | Admitting: Internal Medicine

## 2023-08-26 ENCOUNTER — Encounter: Payer: Self-pay | Admitting: Internal Medicine

## 2023-08-26 NOTE — Assessment & Plan Note (Signed)
 Her diabetes is under excellent control  and she is continuing to lose weight on Mounjaro  . BP is well controlled on losartan  50 mg daily.   Lab Results  Component Value Date   HGBA1C 6.3 08/24/2023   Lab Results  Component Value Date   LABMICR Comment 01/07/2016   MICROALBUR <0.7 05/24/2023

## 2023-08-26 NOTE — Assessment & Plan Note (Signed)
 PAP smear of vaginal cuff  was repeated today .  Exam was normal

## 2023-08-26 NOTE — Assessment & Plan Note (Signed)
 Post ablation on February 17, 2020.  Doing well and maintaining sinus rhythm.  continue aspirin 81 mg by mouth daily for stroke prophylaxis given her CHA2DS2-VASc score of 2.  continue metoprolol 

## 2023-08-26 NOTE — Assessment & Plan Note (Signed)
 Aggravated by chronic pain .  Reduce wellbutrin  to 150 mg daily and adding abilify  2 mg

## 2023-08-26 NOTE — Assessment & Plan Note (Signed)
 Trial of zetia  recommended

## 2023-08-26 NOTE — Assessment & Plan Note (Signed)
 LDL has improved but is not at gaol due to being untreated secondary to statin myalgia  zetia  trial recommended  Lab Results  Component Value Date   CHOL 209 (H) 08/24/2023   HDL 41.40 08/24/2023   LDLCALC 131 (H) 08/24/2023   LDLDIRECT 147.0 08/24/2023   TRIG 182.0 (H) 08/24/2023   CHOLHDL 5 08/24/2023

## 2023-08-26 NOTE — Assessment & Plan Note (Signed)

## 2023-08-28 ENCOUNTER — Ambulatory Visit
Admission: RE | Admit: 2023-08-28 | Discharge: 2023-08-28 | Disposition: A | Discharge status: Home / Self Care | Source: Ambulatory Visit | Attending: Internal Medicine | Admitting: Internal Medicine

## 2023-08-28 ENCOUNTER — Other Ambulatory Visit: Payer: Self-pay

## 2023-08-28 ENCOUNTER — Other Ambulatory Visit (HOSPITAL_BASED_OUTPATIENT_CLINIC_OR_DEPARTMENT_OTHER): Payer: Self-pay

## 2023-08-28 DIAGNOSIS — Z1231 Encounter for screening mammogram for malignant neoplasm of breast: Secondary | ICD-10-CM | POA: Diagnosis not present

## 2023-08-29 LAB — CYTOLOGY - PAP
Chlamydia: NEGATIVE
Comment: NEGATIVE
Comment: NEGATIVE
Comment: NEGATIVE
Comment: NEGATIVE
Comment: NORMAL
Diagnosis: NEGATIVE
HSV1: NEGATIVE
HSV2: NEGATIVE
High risk HPV: NEGATIVE
Neisseria Gonorrhea: NEGATIVE
Trichomonas: NEGATIVE

## 2023-09-01 ENCOUNTER — Other Ambulatory Visit (HOSPITAL_COMMUNITY): Payer: Self-pay

## 2023-09-01 ENCOUNTER — Telehealth: Payer: Self-pay | Admitting: Pharmacy Technician

## 2023-09-01 DIAGNOSIS — Z1211 Encounter for screening for malignant neoplasm of colon: Secondary | ICD-10-CM | POA: Diagnosis not present

## 2023-09-01 NOTE — Telephone Encounter (Signed)
 Pharmacy Patient Advocate Encounter   Received notification from Onbase that prior authorization for Omeprazole  40MG  dr capsules is required/requested.   Insurance verification completed.   The patient is insured through [facility] .   Per test claim: PA required; PA started via CoverMyMeds. KEY [id number] . Waiting for clinical questions to populate.

## 2023-09-04 ENCOUNTER — Other Ambulatory Visit (HOSPITAL_COMMUNITY): Payer: Self-pay

## 2023-09-04 ENCOUNTER — Encounter: Payer: Self-pay | Admitting: Internal Medicine

## 2023-09-04 DIAGNOSIS — E1169 Type 2 diabetes mellitus with other specified complication: Secondary | ICD-10-CM

## 2023-09-04 NOTE — Telephone Encounter (Signed)
 Clinical questions and answers have been submitted.

## 2023-09-04 NOTE — Telephone Encounter (Signed)
 Pharmacy Patient Advocate Encounter  Received notification from [facility] that Prior Authorization for Omeprazole  40MG  dr capsules has been APPROVED from 09/04/2023 to 09/03/2024. Unable to obtain price due to refill too soon rejection, last fill date 08/24/2023 next available fill date09/07/2023.   PA #/Case ID/Reference #: [id number]

## 2023-09-07 MED ORDER — LOSARTAN POTASSIUM 50 MG PO TABS
50.0000 mg | ORAL_TABLET | Freq: Every day | ORAL | 1 refills | Status: AC
Start: 2023-09-07 — End: ?

## 2023-09-07 MED ORDER — BUPROPION HCL ER (XL) 150 MG PO TB24
150.0000 mg | ORAL_TABLET | Freq: Every day | ORAL | 1 refills | Status: AC
Start: 2023-09-07 — End: ?

## 2023-09-07 MED ORDER — PROGESTERONE 200 MG PO CAPS
200.0000 mg | ORAL_CAPSULE | Freq: Every day | ORAL | 1 refills | Status: AC
Start: 2023-09-07 — End: ?

## 2023-09-07 MED ORDER — CELECOXIB 200 MG PO CAPS: 200.0000 mg | ORAL_CAPSULE | Freq: Two times a day (BID) | ORAL | 1 refills | Status: AC

## 2023-09-07 MED ORDER — GABAPENTIN 100 MG PO CAPS: 200.0000 mg | ORAL_CAPSULE | Freq: Every day | ORAL | 0 refills | Status: AC

## 2023-09-07 MED ORDER — TIRZEPATIDE 12.5 MG/0.5ML ~~LOC~~ SOAJ: 12.5000 mg | SUBCUTANEOUS | 2 refills | Status: AC

## 2023-09-07 MED ORDER — CYANOCOBALAMIN 1000 MCG/ML IJ SOLN: 1000.0000 ug | INTRAMUSCULAR | 3 refills | Status: AC

## 2023-09-07 MED ORDER — MONTELUKAST SODIUM 10 MG PO TABS: 10.0000 mg | ORAL_TABLET | Freq: Every day | ORAL | 1 refills | Status: AC

## 2023-09-07 MED ORDER — ESTRADIOL 0.0375 MG/24HR TD PTWK: 0.0375 mg | MEDICATED_PATCH | TRANSDERMAL | 1 refills | Status: AC

## 2023-09-07 MED ORDER — LEVOCETIRIZINE DIHYDROCHLORIDE 5 MG PO TABS: 5.0000 mg | ORAL_TABLET | Freq: Every evening | ORAL | 1 refills | Status: AC

## 2023-09-07 MED ORDER — EZETIMIBE 10 MG PO TABS: 10.0000 mg | ORAL_TABLET | Freq: Every day | ORAL | 3 refills | Status: AC

## 2023-09-07 MED ORDER — OMEPRAZOLE 40 MG PO CPDR
40.0000 mg | DELAYED_RELEASE_CAPSULE | Freq: Every day | ORAL | 3 refills | Status: AC
Start: 2023-09-07 — End: ?

## 2023-09-14 ENCOUNTER — Encounter

## 2023-09-14 DIAGNOSIS — A630 Anogenital (venereal) warts: Secondary | ICD-10-CM | POA: Diagnosis not present

## 2023-09-14 DIAGNOSIS — Z872 Personal history of diseases of the skin and subcutaneous tissue: Secondary | ICD-10-CM | POA: Diagnosis not present

## 2023-09-14 DIAGNOSIS — L249 Irritant contact dermatitis, unspecified cause: Secondary | ICD-10-CM | POA: Diagnosis not present

## 2023-09-14 DIAGNOSIS — Z808 Family history of malignant neoplasm of other organs or systems: Secondary | ICD-10-CM | POA: Diagnosis not present

## 2023-09-14 DIAGNOSIS — L578 Other skin changes due to chronic exposure to nonionizing radiation: Secondary | ICD-10-CM | POA: Diagnosis not present

## 2023-09-14 DIAGNOSIS — Z86018 Personal history of other benign neoplasm: Secondary | ICD-10-CM | POA: Diagnosis not present
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
## 2023-09-21 ENCOUNTER — Ambulatory Visit: Admitting: Family Medicine

## 2023-09-21 ENCOUNTER — Other Ambulatory Visit: Payer: Self-pay

## 2023-09-21 VITALS — BP 122/70 | HR 89 | Ht 69.0 in | Wt 195.0 lb

## 2023-09-21 DIAGNOSIS — M9903 Segmental and somatic dysfunction of lumbar region: Secondary | ICD-10-CM | POA: Diagnosis not present

## 2023-09-21 DIAGNOSIS — M9902 Segmental and somatic dysfunction of thoracic region: Secondary | ICD-10-CM

## 2023-09-21 DIAGNOSIS — G894 Chronic pain syndrome: Secondary | ICD-10-CM | POA: Diagnosis not present

## 2023-09-21 DIAGNOSIS — M9904 Segmental and somatic dysfunction of sacral region: Secondary | ICD-10-CM

## 2023-09-21 DIAGNOSIS — M9901 Segmental and somatic dysfunction of cervical region: Secondary | ICD-10-CM | POA: Diagnosis not present

## 2023-09-21 DIAGNOSIS — M9908 Segmental and somatic dysfunction of rib cage: Secondary | ICD-10-CM

## 2023-09-21 NOTE — Patient Instructions (Addendum)
 Good to see you. Enjoy the cruise. Wear your t-shirt.  See me again in 4 to 6 weeks.

## 2023-09-22 ENCOUNTER — Encounter: Payer: Self-pay | Admitting: Family Medicine
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
## 2023-10-05 ENCOUNTER — Ambulatory Visit: Payer: Self-pay

## 2023-10-05 DIAGNOSIS — D123 Benign neoplasm of transverse colon: Secondary | ICD-10-CM | POA: Diagnosis not present

## 2023-10-05 DIAGNOSIS — Z1211 Encounter for screening for malignant neoplasm of colon: Secondary | ICD-10-CM | POA: Diagnosis not present

## 2023-10-05 DIAGNOSIS — D124 Benign neoplasm of descending colon: Secondary | ICD-10-CM | POA: Diagnosis not present

## 2023-10-05 DIAGNOSIS — D122 Benign neoplasm of ascending colon: Secondary | ICD-10-CM | POA: Diagnosis not present

## 2023-10-05 DIAGNOSIS — D125 Benign neoplasm of sigmoid colon: Secondary | ICD-10-CM | POA: Diagnosis not present

## 2023-10-05 DIAGNOSIS — K640 First degree hemorrhoids: Secondary | ICD-10-CM | POA: Diagnosis not present

## 2023-10-05 DIAGNOSIS — K635 Polyp of colon: Secondary | ICD-10-CM | POA: Diagnosis not present

## 2023-10-18 ENCOUNTER — Other Ambulatory Visit: Payer: Self-pay | Admitting: Internal Medicine

## 2023-10-18 DIAGNOSIS — A630 Anogenital (venereal) warts: Secondary | ICD-10-CM | POA: Diagnosis not present

## 2023-10-19 ENCOUNTER — Encounter: Payer: Self-pay | Admitting: Internal Medicine
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
## 2023-10-26 ENCOUNTER — Ambulatory Visit: Admitting: Family Medicine

## 2023-10-26 ENCOUNTER — Encounter: Payer: Self-pay | Admitting: Family Medicine

## 2023-10-26 VITALS — BP 122/84 | HR 92 | Ht 69.0 in | Wt 191.0 lb

## 2023-10-26 DIAGNOSIS — M9908 Segmental and somatic dysfunction of rib cage: Secondary | ICD-10-CM

## 2023-10-26 DIAGNOSIS — M9904 Segmental and somatic dysfunction of sacral region: Secondary | ICD-10-CM

## 2023-10-26 DIAGNOSIS — M9903 Segmental and somatic dysfunction of lumbar region: Secondary | ICD-10-CM | POA: Diagnosis not present

## 2023-10-26 DIAGNOSIS — M51362 Other intervertebral disc degeneration, lumbar region with discogenic back pain and lower extremity pain: Secondary | ICD-10-CM | POA: Diagnosis not present

## 2023-10-26 DIAGNOSIS — G894 Chronic pain syndrome: Secondary | ICD-10-CM | POA: Diagnosis not present

## 2023-10-26 DIAGNOSIS — M9901 Segmental and somatic dysfunction of cervical region: Secondary | ICD-10-CM

## 2023-10-26 DIAGNOSIS — M9902 Segmental and somatic dysfunction of thoracic region: Secondary | ICD-10-CM

## 2023-10-26 MED ORDER — KETOROLAC TROMETHAMINE 60 MG/2ML IM SOLN
60.0000 mg | Freq: Once | INTRAMUSCULAR | Status: AC
  Administered 2023-10-26: 60 mg via INTRAMUSCULAR

## 2023-10-26 MED ORDER — METHYLPREDNISOLONE ACETATE 80 MG/ML IJ SUSP
80.0000 mg | Freq: Once | INTRAMUSCULAR | Status: AC
  Administered 2023-10-26: 80 mg via INTRAMUSCULAR

## 2023-10-26 NOTE — Assessment & Plan Note (Signed)
 Known arthritic changes but doing relatively well at the moment.  Discussed icing home exercises, discussed which activities to do and which ones to avoid.  Increase activity slowly.  Follow-up again in 6 to 8 weeks

## 2023-10-26 NOTE — Patient Instructions (Addendum)
 Good to see you! Full cocktail given today. See you again in 2 months

## 2023-11-06 ENCOUNTER — Encounter: Payer: Self-pay | Admitting: Internal Medicine

## 2023-11-23 DIAGNOSIS — A630 Anogenital (venereal) warts: Secondary | ICD-10-CM | POA: Diagnosis not present

## 2023-11-27 DIAGNOSIS — F520 Hypoactive sexual desire disorder: Secondary | ICD-10-CM | POA: Diagnosis not present

## 2023-11-27 DIAGNOSIS — Z1331 Encounter for screening for depression: Secondary | ICD-10-CM | POA: Diagnosis not present

## 2023-11-27 DIAGNOSIS — Z79890 Hormone replacement therapy (postmenopausal): Secondary | ICD-10-CM | POA: Diagnosis not present

## 2023-11-29 DIAGNOSIS — Z79890 Hormone replacement therapy (postmenopausal): Secondary | ICD-10-CM | POA: Diagnosis not present

## 2023-11-29 DIAGNOSIS — F520 Hypoactive sexual desire disorder: Secondary | ICD-10-CM | POA: Diagnosis not present
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
## 2023-12-20 ENCOUNTER — Ambulatory Visit: Admitting: Family Medicine

## 2023-12-27 DIAGNOSIS — B379 Candidiasis, unspecified: Secondary | ICD-10-CM | POA: Diagnosis not present

## 2023-12-27 DIAGNOSIS — J0101 Acute recurrent maxillary sinusitis: Secondary | ICD-10-CM | POA: Diagnosis not present

## 2023-12-27 DIAGNOSIS — F520 Hypoactive sexual desire disorder: Secondary | ICD-10-CM | POA: Diagnosis not present

## 2023-12-27 DIAGNOSIS — T3695XA Adverse effect of unspecified systemic antibiotic, initial encounter: Secondary | ICD-10-CM | POA: Diagnosis not present

## 2023-12-27 DIAGNOSIS — Z79890 Hormone replacement therapy (postmenopausal): Secondary | ICD-10-CM | POA: Diagnosis not present

## 2024-01-08 ENCOUNTER — Ambulatory Visit: Admitting: Family Medicine

## 2024-01-08 ENCOUNTER — Encounter: Payer: Self-pay | Admitting: Family Medicine

## 2024-01-08 VITALS — BP 108/74 | HR 83 | Ht 69.0 in | Wt 192.0 lb

## 2024-01-08 DIAGNOSIS — M51362 Other intervertebral disc degeneration, lumbar region with discogenic back pain and lower extremity pain: Secondary | ICD-10-CM | POA: Diagnosis not present

## 2024-01-08 DIAGNOSIS — M9902 Segmental and somatic dysfunction of thoracic region: Secondary | ICD-10-CM

## 2024-01-08 DIAGNOSIS — M25512 Pain in left shoulder: Secondary | ICD-10-CM

## 2024-01-08 DIAGNOSIS — M9903 Segmental and somatic dysfunction of lumbar region: Secondary | ICD-10-CM | POA: Diagnosis not present

## 2024-01-08 DIAGNOSIS — M9908 Segmental and somatic dysfunction of rib cage: Secondary | ICD-10-CM

## 2024-01-08 DIAGNOSIS — M9904 Segmental and somatic dysfunction of sacral region: Secondary | ICD-10-CM | POA: Diagnosis not present

## 2024-01-08 DIAGNOSIS — M9901 Segmental and somatic dysfunction of cervical region: Secondary | ICD-10-CM

## 2024-01-08 NOTE — Patient Instructions (Addendum)
 Trigger point injections See me again as scheduled

## 2024-01-08 NOTE — Assessment & Plan Note (Signed)
 Degenerative disc disease.  Discussed icing regimen and home exercises, increase activity slowly.  Discussed icing regimen.  Follow-up again in 6 to 8 weeks.
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
## 2024-01-08 NOTE — Assessment & Plan Note (Signed)
 Patient given injection and tolerated the procedure well, discussed icing regimen and home exercises, discussed which activities seem to last.  Patient was able to move better almost immediately after the injections which I am hopeful patient will do well.
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
## 2024-01-25 ENCOUNTER — Ambulatory Visit: Admitting: Family Medicine

## 2024-02-14 ENCOUNTER — Encounter: Payer: Self-pay | Admitting: Internal Medicine

## 2024-02-14 NOTE — Telephone Encounter (Signed)
PA for mounjaro is needed.

## 2024-02-15 ENCOUNTER — Other Ambulatory Visit (HOSPITAL_COMMUNITY): Payer: Self-pay

## 2024-02-16 ENCOUNTER — Telehealth: Payer: Self-pay

## 2024-02-16 NOTE — Telephone Encounter (Signed)
 Pharmacy Patient Advocate Encounter   Received notification from Pt Calls Messages that prior authorization for Mounjaro  12.5MG /0.5ML auto-injectors  is required/requested.   Insurance verification completed.   The patient is insured through [employer].   Per test claim: PA required; PA submitted to above mentioned insurance via Latent Key/confirmation #/EOC [id number] Status is pending

## 2024-03-21 ENCOUNTER — Ambulatory Visit: Payer: Self-pay | Admitting: Family Medicine
